# Patient Record
Sex: Male | Born: 1950 | Race: White | Hispanic: No | Marital: Married | Smoking: Former smoker
Health system: Southern US, Community
[De-identification: ages and names within clinical notes are randomized; demographics above are authoritative.]

## PROBLEM LIST (undated history)

## (undated) DIAGNOSIS — K4090 Unilateral inguinal hernia, without obstruction or gangrene, not specified as recurrent: Secondary | ICD-10-CM

## (undated) DIAGNOSIS — I2510 Atherosclerotic heart disease of native coronary artery without angina pectoris: Secondary | ICD-10-CM

## (undated) DIAGNOSIS — K219 Gastro-esophageal reflux disease without esophagitis: Secondary | ICD-10-CM

## (undated) DIAGNOSIS — T7840XA Allergy, unspecified, initial encounter: Secondary | ICD-10-CM

## (undated) DIAGNOSIS — F419 Anxiety disorder, unspecified: Secondary | ICD-10-CM

## (undated) DIAGNOSIS — M1990 Unspecified osteoarthritis, unspecified site: Secondary | ICD-10-CM

## (undated) DIAGNOSIS — K648 Other hemorrhoids: Secondary | ICD-10-CM

## (undated) DIAGNOSIS — N200 Calculus of kidney: Secondary | ICD-10-CM

## (undated) DIAGNOSIS — B029 Zoster without complications: Secondary | ICD-10-CM

## (undated) DIAGNOSIS — C61 Malignant neoplasm of prostate: Secondary | ICD-10-CM

## (undated) DIAGNOSIS — E785 Hyperlipidemia, unspecified: Secondary | ICD-10-CM

## (undated) DIAGNOSIS — K5790 Diverticulosis of intestine, part unspecified, without perforation or abscess without bleeding: Secondary | ICD-10-CM

## (undated) DIAGNOSIS — K635 Polyp of colon: Secondary | ICD-10-CM

## (undated) DIAGNOSIS — F329 Major depressive disorder, single episode, unspecified: Secondary | ICD-10-CM

## (undated) DIAGNOSIS — I219 Acute myocardial infarction, unspecified: Secondary | ICD-10-CM

## (undated) DIAGNOSIS — F32A Depression, unspecified: Secondary | ICD-10-CM

## (undated) DIAGNOSIS — I10 Essential (primary) hypertension: Secondary | ICD-10-CM

## (undated) DIAGNOSIS — N529 Male erectile dysfunction, unspecified: Secondary | ICD-10-CM

## (undated) DIAGNOSIS — I499 Cardiac arrhythmia, unspecified: Secondary | ICD-10-CM

## (undated) HISTORY — PX: COLONOSCOPY: SHX174

## (undated) HISTORY — DX: Atherosclerotic heart disease of native coronary artery without angina pectoris: I25.10

## (undated) HISTORY — DX: Zoster without complications: B02.9

## (undated) HISTORY — DX: Depression, unspecified: F32.A

## (undated) HISTORY — DX: Calculus of kidney: N20.0

## (undated) HISTORY — DX: Other hemorrhoids: K64.8

## (undated) HISTORY — DX: Cardiac arrhythmia, unspecified: I49.9

## (undated) HISTORY — DX: Malignant neoplasm of prostate: C61

## (undated) HISTORY — DX: Allergy, unspecified, initial encounter: T78.40XA

## (undated) HISTORY — DX: Diverticulosis of intestine, part unspecified, without perforation or abscess without bleeding: K57.90

## (undated) HISTORY — DX: Unilateral inguinal hernia, without obstruction or gangrene, not specified as recurrent: K40.90

## (undated) HISTORY — DX: Male erectile dysfunction, unspecified: N52.9

## (undated) HISTORY — DX: Polyp of colon: K63.5

## (undated) HISTORY — DX: Anxiety disorder, unspecified: F41.9

## (undated) HISTORY — DX: Unspecified osteoarthritis, unspecified site: M19.90

## (undated) HISTORY — DX: Major depressive disorder, single episode, unspecified: F32.9

## (undated) HISTORY — DX: Hyperlipidemia, unspecified: E78.5

## (undated) HISTORY — PX: VASECTOMY: SHX75

## (undated) HISTORY — PX: POLYPECTOMY: SHX149

---

## 1998-10-29 HISTORY — PX: INGUINAL HERNIA REPAIR: SUR1180

## 2007-11-09 ENCOUNTER — Ambulatory Visit: Payer: Self-pay | Admitting: Oncology

## 2007-11-18 ENCOUNTER — Ambulatory Visit: Admission: RE | Admit: 2007-11-18 | Discharge: 2007-12-29 | Payer: Self-pay | Admitting: Radiation Oncology

## 2007-11-18 LAB — COMPREHENSIVE METABOLIC PANEL
Albumin: 4.5 g/dL (ref 3.5–5.2)
Alkaline Phosphatase: 56 U/L (ref 39–117)
BUN: 12 mg/dL (ref 6–23)
CO2: 26 mEq/L (ref 19–32)
Calcium: 9.7 mg/dL (ref 8.4–10.5)
Creatinine, Ser: 1.07 mg/dL (ref 0.40–1.50)
Glucose, Bld: 97 mg/dL (ref 70–99)
Potassium: 4.2 mEq/L (ref 3.5–5.3)
Total Bilirubin: 0.8 mg/dL (ref 0.3–1.2)

## 2007-11-18 LAB — CBC WITH DIFFERENTIAL/PLATELET
MCV: 93 fL (ref 81.6–98.0)
MONO#: 0.3 10*3/uL (ref 0.1–0.9)
NEUT%: 69.3 % (ref 40.0–75.0)
RDW: 13.2 % (ref 11.2–14.6)
lymph#: 1.3 10*3/uL (ref 0.9–3.3)

## 2007-11-18 LAB — LACTATE DEHYDROGENASE: LDH: 171 U/L (ref 94–250)

## 2007-11-18 LAB — PSA: PSA: 4.05 ng/mL — ABNORMAL HIGH (ref 0.10–4.00)

## 2007-12-16 ENCOUNTER — Encounter: Admission: RE | Admit: 2007-12-16 | Discharge: 2007-12-16 | Payer: Self-pay | Admitting: Urology

## 2007-12-30 ENCOUNTER — Ambulatory Visit: Admission: RE | Admit: 2007-12-30 | Discharge: 2008-03-05 | Payer: Self-pay | Admitting: Radiation Oncology

## 2008-01-30 DIAGNOSIS — C61 Malignant neoplasm of prostate: Secondary | ICD-10-CM

## 2008-01-30 HISTORY — PX: RADIOACTIVE SEED IMPLANT: SHX5150

## 2008-01-30 HISTORY — DX: Malignant neoplasm of prostate: C61

## 2008-01-31 ENCOUNTER — Ambulatory Visit (HOSPITAL_BASED_OUTPATIENT_CLINIC_OR_DEPARTMENT_OTHER): Admission: RE | Admit: 2008-01-31 | Discharge: 2008-01-31 | Payer: Self-pay | Admitting: Urology

## 2008-12-29 DIAGNOSIS — B029 Zoster without complications: Secondary | ICD-10-CM

## 2008-12-29 HISTORY — DX: Zoster without complications: B02.9

## 2009-06-28 HISTORY — PX: CORONARY ANGIOPLASTY WITH STENT PLACEMENT: SHX49

## 2009-06-29 ENCOUNTER — Inpatient Hospital Stay (HOSPITAL_COMMUNITY): Admission: RE | Admit: 2009-06-29 | Discharge: 2009-06-30 | Payer: Self-pay | Admitting: Cardiology

## 2009-07-05 ENCOUNTER — Encounter (HOSPITAL_COMMUNITY): Admission: RE | Admit: 2009-07-05 | Discharge: 2009-10-03 | Payer: Self-pay | Admitting: Cardiology

## 2010-03-29 DIAGNOSIS — N200 Calculus of kidney: Secondary | ICD-10-CM

## 2010-03-29 HISTORY — DX: Calculus of kidney: N20.0

## 2010-04-21 ENCOUNTER — Emergency Department (HOSPITAL_COMMUNITY): Admission: EM | Admit: 2010-04-21 | Discharge: 2010-04-21 | Payer: Self-pay | Admitting: Emergency Medicine

## 2010-05-29 ENCOUNTER — Encounter (HOSPITAL_COMMUNITY)
Admission: RE | Admit: 2010-05-29 | Discharge: 2010-08-27 | Payer: Self-pay | Source: Home / Self Care | Admitting: Cardiology

## 2010-08-29 ENCOUNTER — Encounter (HOSPITAL_COMMUNITY)
Admission: RE | Admit: 2010-08-29 | Discharge: 2010-11-27 | Payer: Self-pay | Source: Home / Self Care | Admitting: Cardiology

## 2010-11-28 ENCOUNTER — Encounter (HOSPITAL_COMMUNITY)
Admission: RE | Admit: 2010-11-28 | Discharge: 2011-01-28 | Payer: Self-pay | Source: Home / Self Care | Attending: Cardiology | Admitting: Cardiology

## 2011-01-30 ENCOUNTER — Encounter (HOSPITAL_COMMUNITY): Admission: RE | Admit: 2011-01-30 | Payer: Self-pay | Source: Ambulatory Visit

## 2011-01-30 DIAGNOSIS — Z9861 Coronary angioplasty status: Secondary | ICD-10-CM | POA: Insufficient documentation

## 2011-01-30 DIAGNOSIS — Z5189 Encounter for other specified aftercare: Secondary | ICD-10-CM | POA: Insufficient documentation

## 2011-01-30 DIAGNOSIS — E785 Hyperlipidemia, unspecified: Secondary | ICD-10-CM | POA: Insufficient documentation

## 2011-01-30 DIAGNOSIS — K219 Gastro-esophageal reflux disease without esophagitis: Secondary | ICD-10-CM | POA: Insufficient documentation

## 2011-01-30 DIAGNOSIS — I209 Angina pectoris, unspecified: Secondary | ICD-10-CM | POA: Insufficient documentation

## 2011-01-30 DIAGNOSIS — I10 Essential (primary) hypertension: Secondary | ICD-10-CM | POA: Insufficient documentation

## 2011-01-30 DIAGNOSIS — I2510 Atherosclerotic heart disease of native coronary artery without angina pectoris: Secondary | ICD-10-CM | POA: Insufficient documentation

## 2011-01-30 DIAGNOSIS — I2582 Chronic total occlusion of coronary artery: Secondary | ICD-10-CM | POA: Insufficient documentation

## 2011-01-31 ENCOUNTER — Encounter (HOSPITAL_COMMUNITY): Payer: Self-pay

## 2011-02-04 ENCOUNTER — Encounter (HOSPITAL_COMMUNITY): Payer: Self-pay | Attending: Cardiology

## 2011-02-06 ENCOUNTER — Encounter (HOSPITAL_COMMUNITY): Admission: RE | Admit: 2011-02-06 | Payer: Self-pay | Source: Ambulatory Visit

## 2011-02-07 ENCOUNTER — Encounter (HOSPITAL_COMMUNITY): Payer: Self-pay

## 2011-02-11 ENCOUNTER — Encounter (HOSPITAL_COMMUNITY): Payer: Self-pay

## 2011-02-13 ENCOUNTER — Encounter (HOSPITAL_COMMUNITY): Payer: Self-pay

## 2011-02-14 ENCOUNTER — Encounter (HOSPITAL_COMMUNITY): Payer: Self-pay

## 2011-02-17 DIAGNOSIS — E7800 Pure hypercholesterolemia, unspecified: Secondary | ICD-10-CM | POA: Insufficient documentation

## 2011-02-17 DIAGNOSIS — K219 Gastro-esophageal reflux disease without esophagitis: Secondary | ICD-10-CM | POA: Insufficient documentation

## 2011-02-18 ENCOUNTER — Encounter (HOSPITAL_COMMUNITY): Payer: Self-pay

## 2011-02-20 ENCOUNTER — Encounter (HOSPITAL_COMMUNITY): Payer: Self-pay

## 2011-02-21 ENCOUNTER — Encounter (HOSPITAL_COMMUNITY): Payer: Self-pay

## 2011-02-25 ENCOUNTER — Encounter (HOSPITAL_COMMUNITY): Payer: Self-pay

## 2011-02-27 ENCOUNTER — Encounter (HOSPITAL_COMMUNITY): Payer: Self-pay | Attending: Cardiology

## 2011-02-27 DIAGNOSIS — I209 Angina pectoris, unspecified: Secondary | ICD-10-CM | POA: Insufficient documentation

## 2011-02-27 DIAGNOSIS — I10 Essential (primary) hypertension: Secondary | ICD-10-CM | POA: Insufficient documentation

## 2011-02-27 DIAGNOSIS — E785 Hyperlipidemia, unspecified: Secondary | ICD-10-CM | POA: Insufficient documentation

## 2011-02-27 DIAGNOSIS — Z9861 Coronary angioplasty status: Secondary | ICD-10-CM | POA: Insufficient documentation

## 2011-02-27 DIAGNOSIS — I2582 Chronic total occlusion of coronary artery: Secondary | ICD-10-CM | POA: Insufficient documentation

## 2011-02-27 DIAGNOSIS — Z5189 Encounter for other specified aftercare: Secondary | ICD-10-CM | POA: Insufficient documentation

## 2011-02-27 DIAGNOSIS — I2510 Atherosclerotic heart disease of native coronary artery without angina pectoris: Secondary | ICD-10-CM | POA: Insufficient documentation

## 2011-02-27 DIAGNOSIS — K219 Gastro-esophageal reflux disease without esophagitis: Secondary | ICD-10-CM | POA: Insufficient documentation

## 2011-02-28 ENCOUNTER — Encounter (HOSPITAL_COMMUNITY): Payer: Self-pay

## 2011-03-04 ENCOUNTER — Encounter (HOSPITAL_COMMUNITY): Payer: Self-pay

## 2011-03-06 ENCOUNTER — Encounter (HOSPITAL_COMMUNITY): Payer: Self-pay

## 2011-03-07 ENCOUNTER — Encounter (HOSPITAL_COMMUNITY): Payer: Self-pay

## 2011-03-11 ENCOUNTER — Encounter (HOSPITAL_COMMUNITY): Payer: Self-pay

## 2011-03-12 ENCOUNTER — Encounter (HOSPITAL_COMMUNITY): Payer: Self-pay

## 2011-03-13 ENCOUNTER — Encounter (HOSPITAL_COMMUNITY): Payer: Self-pay

## 2011-03-14 ENCOUNTER — Encounter (HOSPITAL_COMMUNITY): Payer: Self-pay

## 2011-03-18 ENCOUNTER — Encounter (HOSPITAL_COMMUNITY): Payer: Self-pay

## 2011-03-18 LAB — POCT I-STAT, CHEM 8
BUN: 16 mg/dL (ref 6–23)
Calcium, Ion: 1.14 mmol/L (ref 1.12–1.32)
Chloride: 106 mEq/L (ref 96–112)
Creatinine, Ser: 1.1 mg/dL (ref 0.4–1.5)
HCT: 46 % (ref 39.0–52.0)
Hemoglobin: 15.6 g/dL (ref 13.0–17.0)
Potassium: 3.9 mEq/L (ref 3.5–5.1)
Sodium: 141 mEq/L (ref 135–145)

## 2011-03-18 LAB — URINE MICROSCOPIC-ADD ON

## 2011-03-18 LAB — URINALYSIS, ROUTINE W REFLEX MICROSCOPIC
Ketones, ur: 15 mg/dL — AB
Leukocytes, UA: NEGATIVE
Protein, ur: NEGATIVE mg/dL
Urobilinogen, UA: 0.2 mg/dL (ref 0.0–1.0)

## 2011-03-19 ENCOUNTER — Ambulatory Visit (INDEPENDENT_AMBULATORY_CARE_PROVIDER_SITE_OTHER): Payer: BC Managed Care – PPO | Admitting: Family Medicine

## 2011-03-19 DIAGNOSIS — L258 Unspecified contact dermatitis due to other agents: Secondary | ICD-10-CM

## 2011-03-19 DIAGNOSIS — I259 Chronic ischemic heart disease, unspecified: Secondary | ICD-10-CM

## 2011-03-19 DIAGNOSIS — E7800 Pure hypercholesterolemia, unspecified: Secondary | ICD-10-CM

## 2011-03-19 DIAGNOSIS — F329 Major depressive disorder, single episode, unspecified: Secondary | ICD-10-CM

## 2011-03-20 ENCOUNTER — Encounter (HOSPITAL_COMMUNITY): Payer: Self-pay

## 2011-03-21 ENCOUNTER — Encounter (HOSPITAL_COMMUNITY): Payer: BC Managed Care – PPO

## 2011-03-25 ENCOUNTER — Encounter (HOSPITAL_COMMUNITY): Admission: RE | Admit: 2011-03-25 | Payer: Self-pay | Source: Ambulatory Visit

## 2011-03-27 ENCOUNTER — Encounter (HOSPITAL_COMMUNITY): Payer: Self-pay

## 2011-03-28 ENCOUNTER — Encounter (HOSPITAL_COMMUNITY): Payer: BC Managed Care – PPO

## 2011-04-01 ENCOUNTER — Encounter (HOSPITAL_COMMUNITY): Payer: Self-pay | Attending: Cardiology

## 2011-04-01 DIAGNOSIS — Z9861 Coronary angioplasty status: Secondary | ICD-10-CM | POA: Insufficient documentation

## 2011-04-01 DIAGNOSIS — I2582 Chronic total occlusion of coronary artery: Secondary | ICD-10-CM | POA: Insufficient documentation

## 2011-04-01 DIAGNOSIS — I2510 Atherosclerotic heart disease of native coronary artery without angina pectoris: Secondary | ICD-10-CM | POA: Insufficient documentation

## 2011-04-01 DIAGNOSIS — I10 Essential (primary) hypertension: Secondary | ICD-10-CM | POA: Insufficient documentation

## 2011-04-01 DIAGNOSIS — K219 Gastro-esophageal reflux disease without esophagitis: Secondary | ICD-10-CM | POA: Insufficient documentation

## 2011-04-01 DIAGNOSIS — E785 Hyperlipidemia, unspecified: Secondary | ICD-10-CM | POA: Insufficient documentation

## 2011-04-01 DIAGNOSIS — I209 Angina pectoris, unspecified: Secondary | ICD-10-CM | POA: Insufficient documentation

## 2011-04-01 DIAGNOSIS — Z5189 Encounter for other specified aftercare: Secondary | ICD-10-CM | POA: Insufficient documentation

## 2011-04-03 ENCOUNTER — Encounter (HOSPITAL_COMMUNITY): Payer: Self-pay

## 2011-04-04 ENCOUNTER — Encounter (HOSPITAL_COMMUNITY): Payer: Self-pay

## 2011-04-07 LAB — CARDIAC PANEL(CRET KIN+CKTOT+MB+TROPI)
CK, MB: 2 ng/mL (ref 0.3–4.0)
CK, MB: 6.8 ng/mL — ABNORMAL HIGH (ref 0.3–4.0)
Relative Index: INVALID (ref 0.0–2.5)
Total CK: 83 U/L (ref 7–232)

## 2011-04-07 LAB — CBC
Hemoglobin: 14.4 g/dL (ref 13.0–17.0)
MCV: 95.3 fL (ref 78.0–100.0)
RDW: 13.2 % (ref 11.5–15.5)

## 2011-04-07 LAB — BASIC METABOLIC PANEL
BUN: 11 mg/dL (ref 6–23)
Glucose, Bld: 90 mg/dL (ref 70–99)

## 2011-04-08 ENCOUNTER — Encounter (HOSPITAL_COMMUNITY): Payer: Self-pay

## 2011-04-10 ENCOUNTER — Encounter (HOSPITAL_COMMUNITY): Payer: Self-pay

## 2011-04-11 ENCOUNTER — Encounter (HOSPITAL_COMMUNITY): Payer: Self-pay

## 2011-04-15 ENCOUNTER — Encounter (HOSPITAL_COMMUNITY): Payer: Self-pay

## 2011-04-17 ENCOUNTER — Encounter (HOSPITAL_COMMUNITY): Payer: Self-pay

## 2011-04-18 ENCOUNTER — Encounter: Payer: Self-pay | Admitting: Family Medicine

## 2011-04-18 ENCOUNTER — Encounter (HOSPITAL_COMMUNITY): Payer: Self-pay

## 2011-04-18 DIAGNOSIS — K219 Gastro-esophageal reflux disease without esophagitis: Secondary | ICD-10-CM

## 2011-04-18 DIAGNOSIS — R972 Elevated prostate specific antigen [PSA]: Secondary | ICD-10-CM

## 2011-04-18 DIAGNOSIS — E7800 Pure hypercholesterolemia, unspecified: Secondary | ICD-10-CM

## 2011-04-18 DIAGNOSIS — I10 Essential (primary) hypertension: Secondary | ICD-10-CM

## 2011-04-22 ENCOUNTER — Encounter (HOSPITAL_COMMUNITY): Payer: Self-pay

## 2011-04-24 ENCOUNTER — Encounter (HOSPITAL_COMMUNITY): Payer: Self-pay

## 2011-04-25 ENCOUNTER — Encounter (HOSPITAL_COMMUNITY): Payer: Self-pay

## 2011-04-29 ENCOUNTER — Encounter (HOSPITAL_COMMUNITY): Admission: RE | Admit: 2011-04-29 | Payer: Self-pay | Source: Ambulatory Visit

## 2011-04-29 DIAGNOSIS — I2510 Atherosclerotic heart disease of native coronary artery without angina pectoris: Secondary | ICD-10-CM | POA: Insufficient documentation

## 2011-04-29 DIAGNOSIS — I2582 Chronic total occlusion of coronary artery: Secondary | ICD-10-CM | POA: Insufficient documentation

## 2011-04-29 DIAGNOSIS — Z9861 Coronary angioplasty status: Secondary | ICD-10-CM | POA: Insufficient documentation

## 2011-04-29 DIAGNOSIS — I209 Angina pectoris, unspecified: Secondary | ICD-10-CM | POA: Insufficient documentation

## 2011-04-29 DIAGNOSIS — I10 Essential (primary) hypertension: Secondary | ICD-10-CM | POA: Insufficient documentation

## 2011-04-29 DIAGNOSIS — E785 Hyperlipidemia, unspecified: Secondary | ICD-10-CM | POA: Insufficient documentation

## 2011-04-29 DIAGNOSIS — K219 Gastro-esophageal reflux disease without esophagitis: Secondary | ICD-10-CM | POA: Insufficient documentation

## 2011-04-29 DIAGNOSIS — Z5189 Encounter for other specified aftercare: Secondary | ICD-10-CM | POA: Insufficient documentation

## 2011-05-01 ENCOUNTER — Encounter (HOSPITAL_COMMUNITY): Payer: Self-pay | Attending: Cardiology

## 2011-05-02 ENCOUNTER — Encounter (HOSPITAL_COMMUNITY): Payer: Self-pay

## 2011-05-06 ENCOUNTER — Encounter (HOSPITAL_COMMUNITY): Payer: Self-pay

## 2011-05-08 ENCOUNTER — Encounter (HOSPITAL_COMMUNITY): Payer: Self-pay

## 2011-05-09 ENCOUNTER — Encounter (HOSPITAL_COMMUNITY): Payer: Self-pay

## 2011-05-13 ENCOUNTER — Encounter (HOSPITAL_COMMUNITY): Payer: Self-pay

## 2011-05-13 NOTE — Cardiovascular Report (Signed)
NAMEJOSHIAH, TRAYNHAM NO.:  [id number]   MEDICAL RECORD NO.:  [id number]          PATIENT TYPE:  INP   LOCATION:  2501                         FACILITY:  [facility]   PHYSICIAN:  Lyn Records, M.D.   DATE OF BIRTH:  [birth year]   DATE OF PROCEDURE:  06/29/2009  DATE OF DISCHARGE:                            CARDIAC CATHETERIZATION   INDICATIONS:  The patient has been having angina for greater than 6  weeks.  A recent Cardiolite was abnormal with anteroapical ischemia.  Cath this morning demonstrated 99% mid LAD diagonal #1 lesion.   PROCEDURE PERFORMED:  DES stent, chronic total occlusion of LAD.  Double  wire procedure with diagonal protection.   DESCRIPTION:  After informed consent, the 5-French sheath was exchanged  for a 6-French sheath.  This was performed with double gloving and both  Betadine and chlorhexidine antiseptic skin preparation.  Xylocaine 1%  local infiltration was performed.  The sheath was removed and discarded.  The new sheath was placed.  The outer layer of gloves were removed from  the operator and the scrub nurse.   We used a 3.5 XB LAD 6-French catheter to obtain guiding shots.  We then  used a BMW and [employer] wire to perform the procedure.  The BMW  was placed down the LAD and the Prowater into the diagonal.  We  predilated the mid LAD with a 2.5 x 12-mm long apex balloon to 12  atmospheres.  We then positioned and deployed a 3.0 x 15 Xience V DES to  12 atmospheres.  We then placed a 3.25-mm Quantum apex and postdilated  to 16 atmospheres.  We had removed the side branch wire and repositioned  it in the diagonal before high-pressure dilatation.  After high-pressure  dilatation, the diagonal remained patent with TIMI grade 3 flow.  Both  wires were removed, nitroglycerin was given, and angiographic images  were obtained.  The vessel demonstrated an 80-90% residual stenosis in  the ostium of the diagonal, unchanged from the findings  pre-stenting.  There is a region of midvessel 70-80% stenosis in the LAD.   The patient received an Angiomax bolus and infusion.  ACT was documented  to be greater than 400.  He received 600 mg of Plavix orally immediately  after crossing the LAD occlusion with the guidewire.  No complications  occurred.   CONCLUSION:  Successful recanalization of a chronic total occlusion of  the LAD.  This total occlusion is probably less than 74 weeks old.  The  vessel was stented with a drug-eluting stent down to less than 10%  stenosis.  Residual  moderately severe disease in the mid LAD and in the diagonal was not  treated with angioplasty or stenting.  Should the patient has symptoms,  perhaps these lesions will need to be approached.   PLAN:  Per Dr. Anne Fu.  At this point, aggressive secondary prevention.  Plavix should be continued at least 1 year.      Lyn Records, M.D.  Electronically Signed     HWS/MEDQ  D:  06/29/2009  T:  06/30/2009  Job:  [id number]   cc:   Jake Bathe, MD  Lavonda Jumbo, M.D.

## 2011-05-13 NOTE — Cardiovascular Report (Signed)
[name], [name] NO.:  [id number]   MEDICAL RECORD NO.:  [id number]          PATIENT TYPE:  INP   LOCATION:  2501                         FACILITY:  [facility]   PHYSICIAN:  Jake Bathe, MD      DATE OF BIRTH:  March 13, 1951   DATE OF PROCEDURE:  06/29/2009  DATE OF DISCHARGE:                            CARDIAC CATHETERIZATION   PROCEDURES:  1. Left heart catheterization.  2. Selective coronary angiography.  3. Left ventriculogram.   INDICATIONS:  A 60 year old male with severely abnormal nuclear stress  test demonstrating anteroseptal wall severe ischemia with hypertension,  hyperlipidemia, and chest pain 2 weeks ago.  No current chest pain.  No  chest pain on treadmill.   PROCEDURE DETAILS:  An informed consent was obtained.  Risk of stroke,  heart attack, death, renal impairment, bleeding, and arterial damage  were explained to the patient at length.  He was placed in the  catheterization table, prepped in sterile fashion.  Fluoroscopy of the  femoral head was obtained.  Lidocaine 1% was used for local anesthesia.  A 5-French sheath was placed into the right femoral artery using the  modified Seldinger technique with no difficulty.  A Judkins left #4  catheter and a Judkins right #4 catheter was used to selectively  cannulate the right coronary artery and left coronary artery.  Multiple  views of hand injection with Omnipaque were obtained.  The left  ventriculogram was performed utilizing a angle pigtail catheter and 30  mL of contrast in the RAO position.   Hemodynamics of the left ventricle were obtained and pullback was  obtained across the aortic valve.   FINDINGS:  1. Left main artery - branches into the LAD and circumflex artery -      there is no angiographically significant coronary artery disease      present.  2. Left anterior descending artery - there is a 100% occlusion at the      bifurcation of the first diagonal branch, which has a very  slow      amount of flow through this artery and dye staining just after      occlusion.  The first diagonal branch has a 90% stenosis      proximally.  3. Circumflex artery - large obtuse marginal system.  There are 3      small obtuse marginal branches proximal to the large distal      inferolateral branch.  4. Right coronary artery.  This artery is the dominant vessel giving      rise to the posterior descending artery - there is no      angiographically significant coronary artery disease present.      There is a small degree of right-to-left collaterals filling the      LAD system.  5. Left ventriculogram.  There is mid to distal/apical anterior wall      severe hypokinesis/akinesis.  No significant mitral regurgitation      present.  Ejection fraction is approximately 40%.  No LV thrombus      is noted.   HEMODYNAMICS:  Left ventricular systolic  pressure 117 with an end-  diastolic pressure of 11 mmHg.  Aortic pressure 117/77, with a mean of  96 mmHg.   IMPRESSION:  1. Severe left anterior descending coronary artery disease with      occlusion and 90% ostial first diagonal stenosis.  This correlates      with nuclear stress test findings, which demonstrate reversibility      in this territory.  2. Left ventricular ejection fraction of approximately 40% with mid to      apical anterior wall akinesis/severe hypokinesis.  3. No evidence of aortic stenosis or mitral regurgitation.   PLAN:  Findings have been discussed with Dr. Verdis Prime who will  attempt percutaneous intervention.  Continue with Lipitor.  We will  increased from 10 mg to 20 mg once a day.  His sheaths will be sewn in  and 4000 units of heparin will be administered and pressure bag will be  placed.  Dr. Katrinka Blazing will be available shortly for intervention.  He is  currently chest pain free.      Jake Bathe, MD  Electronically Signed     MCS/MEDQ  D:  06/29/2009  T:  06/30/2009  Job:  [id number]

## 2011-05-13 NOTE — Op Note (Signed)
NAME:  [name], [name] NO.:  [id number]   MEDICAL RECORD NO.:  [id number]          PATIENT TYPE:  AMB   LOCATION:  [facility]                         FACILITY:  [facility]   PHYSICIAN:  Mark C. Vernie Ammons, M.D.  DATE OF BIRTH:  Jan 20, 1951   DATE OF PROCEDURE:  01/31/2008  DATE OF DISCHARGE:                               OPERATIVE REPORT   PREOPERATIVE DIAGNOSIS:  Adenocarcinoma of the prostate.   POSTOPERATIVE DIAGNOSIS:  Adenocarcinoma of the prostate.   PROCEDURE:  I-125 seed implant.   SURGEON:  Mark C. Vernie Ammons, M.D.   RADIATION ONCOLOGIST:  Maryln Gottron, M.D.   ANESTHESIA:  General.   DRAINS:  16-French Foley catheter.   BLOOD LOSS:  Minimal.   SPECIMENS:  None.   COMPLICATIONS:  None.   INDICATIONS:  The patient is a 60 year old white male who was found have  an elevated PSA of 5.  He had a benign exam but was found by biopsy to  have adenocarcinoma of the prostate and has elected to proceed with  radioactive seed implant for treatment of this.  We have discussed the  risks, complications and alternatives.  He understands and elected to  proceed.   DESCRIPTION OF OPERATION:  After informed consent, the patient was  brought to major OR, placed on the table, administered general  anesthesia, then moved to the modified lithotomy position with the  perineum perpendicular to the floor.  A 16-French Foley catheter with  dilute contrast was then inserted in the bladder and the rectal probe as  well as a rectal tube were inserted in the rectum.  The transrectal  ultrasound probe was connected into the securing base and real-time  planning was performed with the Nucletron software by Dr. Dayton Scrape.   After the full plan was complete, I then proceeded to place a total of  72 seeds with 28 needles according to the previously-devised plan using  real-time ultrasound guidance.  This proceeded without complication.  Therefore the transrectal ultrasound probe and rectal  tube were removed  and the Foley catheter was removed.  The seed placement was documented  fluoroscopically with all seeds appearing to be in good position.   Flexible cystoscopy was then performed by first removing the Foley  catheter and then inserting the 17-French flexible scope under direct  visualization.  The urethra was noted be normal down to the sphincter,  which appears intact.  Prostatic urethra revealed bilobar hypertrophy  but had no lesions or evidence of foreign seeds within the prostatic  urethra.  The scope was then advanced into the bladder and the bladder  was then fully inspected.  There no tumors, stones or inflammatory  lesions identified.  Ureteral orifices were in normal configuration and  position and retroflexion of the scope revealed no seeds protruding from  the base of the prostate.  I therefore removed the cystoscope and  reinserted a new 16-French Foley catheter and connected this to closed-  system drainage, and the patient was awakened and taken to the recovery  room in stable, satisfactory condition.  He tolerated the procedure  well.  There were  no intraoperative complications.   The Foley catheter will be left in dwelling for 24 hours and the patient  will be maintained on Cipro 500 mg b.i.d. for 5 days and be given a  prescription for Vicodin #12.  He will return to my office in follow-up  in 3 weeks.      Mark C. Vernie Ammons, M.D.  Electronically Signed     MCO/MEDQ  D:  01/31/2008  T:  01/31/2008  Job:  [id number]   cc:   Meredith Staggers, M.D.  Fax: [phone]

## 2011-05-15 ENCOUNTER — Encounter: Payer: Self-pay | Admitting: Family Medicine

## 2011-05-15 ENCOUNTER — Ambulatory Visit (INDEPENDENT_AMBULATORY_CARE_PROVIDER_SITE_OTHER): Payer: BC Managed Care – PPO | Admitting: Family Medicine

## 2011-05-15 ENCOUNTER — Encounter (HOSPITAL_COMMUNITY): Payer: Self-pay

## 2011-05-15 DIAGNOSIS — Z79899 Other long term (current) drug therapy: Secondary | ICD-10-CM

## 2011-05-15 DIAGNOSIS — E785 Hyperlipidemia, unspecified: Secondary | ICD-10-CM

## 2011-05-15 DIAGNOSIS — F411 Generalized anxiety disorder: Secondary | ICD-10-CM

## 2011-05-15 DIAGNOSIS — F419 Anxiety disorder, unspecified: Secondary | ICD-10-CM

## 2011-05-15 LAB — CBC WITH DIFFERENTIAL/PLATELET
Eosinophils Relative: 1 % (ref 0–5)
Hemoglobin: 16 g/dL (ref 13.0–17.0)
Lymphocytes Relative: 25 % (ref 12–46)
MCHC: 34.9 g/dL (ref 30.0–36.0)
MCV: 94.3 fL (ref 78.0–100.0)
Monocytes Relative: 9 % (ref 3–12)
Neutro Abs: 4.1 10*3/uL (ref 1.7–7.7)
Neutrophils Relative %: 64 % (ref 43–77)
Platelets: 175 10*3/uL (ref 150–400)
RBC: 4.87 MIL/uL (ref 4.22–5.81)
RDW: 13.6 % (ref 11.5–15.5)

## 2011-05-15 LAB — COMPREHENSIVE METABOLIC PANEL
ALT: 25 U/L (ref 0–53)
AST: 26 U/L (ref 0–37)
BUN: 16 mg/dL (ref 6–23)
Creat: 1.02 mg/dL (ref 0.40–1.50)
Glucose, Bld: 84 mg/dL (ref 70–99)
Potassium: 4.5 mEq/L (ref 3.5–5.3)
Sodium: 138 mEq/L (ref 135–145)

## 2011-05-15 LAB — LIPID PANEL
HDL: 49 mg/dL (ref >39)
LDL Cholesterol: 63 mg/dL (ref 0–99)
Total CHOL/HDL Ratio: 2.7 Ratio
Triglycerides: 93 mg/dL (ref <150)
VLDL: 19 mg/dL (ref 0–40)

## 2011-05-15 NOTE — Progress Notes (Signed)
  Subjective:    Patient ID: [name], male    DOB: 02-10-1951, 60 y.o.   MRN: [id number]  HPI he is here for followup on his anxiety. He is presently on 40 mg of Celexa and uses Xanax 2 or 3 times per week. He seems to be doing well on this. He apparently has been in counseling in the past and has not the need for it right now. He also continues on his other medications and needs blood work. His medical record was reviewed and does show evidence of heart disease or prostate cancer and hyperlipidemia.   Review of Systems     Objective:   Physical Exam alert and in no distress with a slightly flat affect.        Assessment & Plan:  Anxiety. ASHD. Prostate cancer. Blood work will be drawn.

## 2011-05-15 NOTE — Patient Instructions (Signed)
Continue on your present medications. Dr. Lynelle Doctor will contact you concerning your blood work.

## 2011-05-16 ENCOUNTER — Encounter (HOSPITAL_COMMUNITY): Payer: Self-pay

## 2011-05-19 ENCOUNTER — Encounter: Payer: Self-pay | Admitting: Family Medicine

## 2011-05-20 ENCOUNTER — Encounter (HOSPITAL_COMMUNITY): Payer: Self-pay

## 2011-05-22 ENCOUNTER — Encounter (HOSPITAL_COMMUNITY): Payer: Self-pay

## 2011-05-23 ENCOUNTER — Encounter (HOSPITAL_COMMUNITY): Payer: Self-pay

## 2011-05-27 ENCOUNTER — Encounter (HOSPITAL_COMMUNITY): Payer: Self-pay

## 2011-05-29 ENCOUNTER — Encounter (HOSPITAL_COMMUNITY): Payer: Self-pay

## 2011-05-30 ENCOUNTER — Encounter (HOSPITAL_COMMUNITY): Payer: Self-pay | Attending: Cardiology

## 2011-05-30 DIAGNOSIS — K219 Gastro-esophageal reflux disease without esophagitis: Secondary | ICD-10-CM | POA: Insufficient documentation

## 2011-05-30 DIAGNOSIS — I10 Essential (primary) hypertension: Secondary | ICD-10-CM | POA: Insufficient documentation

## 2011-05-30 DIAGNOSIS — I2510 Atherosclerotic heart disease of native coronary artery without angina pectoris: Secondary | ICD-10-CM | POA: Insufficient documentation

## 2011-05-30 DIAGNOSIS — E785 Hyperlipidemia, unspecified: Secondary | ICD-10-CM | POA: Insufficient documentation

## 2011-05-30 DIAGNOSIS — I2582 Chronic total occlusion of coronary artery: Secondary | ICD-10-CM | POA: Insufficient documentation

## 2011-05-30 DIAGNOSIS — Z9861 Coronary angioplasty status: Secondary | ICD-10-CM | POA: Insufficient documentation

## 2011-05-30 DIAGNOSIS — Z5189 Encounter for other specified aftercare: Secondary | ICD-10-CM | POA: Insufficient documentation

## 2011-05-30 DIAGNOSIS — I209 Angina pectoris, unspecified: Secondary | ICD-10-CM | POA: Insufficient documentation

## 2011-06-03 ENCOUNTER — Encounter (HOSPITAL_COMMUNITY): Payer: Self-pay

## 2011-06-05 ENCOUNTER — Encounter (HOSPITAL_COMMUNITY): Payer: Self-pay

## 2011-06-06 ENCOUNTER — Encounter (HOSPITAL_COMMUNITY): Payer: Self-pay

## 2011-06-10 ENCOUNTER — Encounter (HOSPITAL_COMMUNITY): Payer: Self-pay

## 2011-06-11 ENCOUNTER — Other Ambulatory Visit: Payer: Self-pay | Admitting: *Deleted

## 2011-06-11 DIAGNOSIS — F419 Anxiety disorder, unspecified: Secondary | ICD-10-CM

## 2011-06-11 MED ORDER — ALPRAZOLAM 0.5 MG PO TABS: 0.5000 mg | ORAL_TABLET | Freq: Every evening | ORAL | Status: DC | PRN

## 2011-06-12 ENCOUNTER — Encounter (HOSPITAL_COMMUNITY): Payer: Self-pay

## 2011-06-13 ENCOUNTER — Encounter (HOSPITAL_COMMUNITY): Payer: Self-pay

## 2011-06-17 ENCOUNTER — Encounter (HOSPITAL_COMMUNITY): Payer: Self-pay

## 2011-06-19 ENCOUNTER — Encounter (HOSPITAL_COMMUNITY): Payer: Self-pay

## 2011-06-20 ENCOUNTER — Encounter (HOSPITAL_COMMUNITY): Payer: Self-pay

## 2011-06-24 ENCOUNTER — Encounter (HOSPITAL_COMMUNITY): Payer: Self-pay

## 2011-06-26 ENCOUNTER — Encounter (HOSPITAL_COMMUNITY): Payer: Self-pay

## 2011-06-27 ENCOUNTER — Encounter (HOSPITAL_COMMUNITY): Payer: Self-pay

## 2011-07-01 ENCOUNTER — Encounter (HOSPITAL_COMMUNITY): Payer: Self-pay | Attending: Cardiology

## 2011-07-01 DIAGNOSIS — I209 Angina pectoris, unspecified: Secondary | ICD-10-CM | POA: Insufficient documentation

## 2011-07-01 DIAGNOSIS — I10 Essential (primary) hypertension: Secondary | ICD-10-CM | POA: Insufficient documentation

## 2011-07-01 DIAGNOSIS — I2510 Atherosclerotic heart disease of native coronary artery without angina pectoris: Secondary | ICD-10-CM | POA: Insufficient documentation

## 2011-07-01 DIAGNOSIS — E785 Hyperlipidemia, unspecified: Secondary | ICD-10-CM | POA: Insufficient documentation

## 2011-07-01 DIAGNOSIS — K219 Gastro-esophageal reflux disease without esophagitis: Secondary | ICD-10-CM | POA: Insufficient documentation

## 2011-07-01 DIAGNOSIS — Z5189 Encounter for other specified aftercare: Secondary | ICD-10-CM | POA: Insufficient documentation

## 2011-07-01 DIAGNOSIS — I2582 Chronic total occlusion of coronary artery: Secondary | ICD-10-CM | POA: Insufficient documentation

## 2011-07-01 DIAGNOSIS — Z9861 Coronary angioplasty status: Secondary | ICD-10-CM | POA: Insufficient documentation

## 2011-07-03 ENCOUNTER — Encounter (HOSPITAL_COMMUNITY): Payer: Self-pay

## 2011-07-04 ENCOUNTER — Encounter (HOSPITAL_COMMUNITY): Payer: Self-pay

## 2011-07-08 ENCOUNTER — Encounter (HOSPITAL_COMMUNITY): Payer: Self-pay

## 2011-07-10 ENCOUNTER — Encounter (HOSPITAL_COMMUNITY): Payer: Self-pay

## 2011-07-11 ENCOUNTER — Encounter (HOSPITAL_COMMUNITY): Payer: Self-pay

## 2011-07-15 ENCOUNTER — Encounter (HOSPITAL_COMMUNITY): Payer: Self-pay

## 2011-07-17 ENCOUNTER — Encounter (HOSPITAL_COMMUNITY): Payer: Self-pay

## 2011-07-18 ENCOUNTER — Encounter (HOSPITAL_COMMUNITY): Payer: Self-pay

## 2011-07-22 ENCOUNTER — Encounter (HOSPITAL_COMMUNITY): Payer: Self-pay

## 2011-07-24 ENCOUNTER — Encounter (HOSPITAL_COMMUNITY): Payer: Self-pay

## 2011-07-25 ENCOUNTER — Encounter (HOSPITAL_COMMUNITY): Payer: Self-pay

## 2011-07-29 ENCOUNTER — Encounter (HOSPITAL_COMMUNITY): Payer: Self-pay

## 2011-07-31 ENCOUNTER — Encounter (HOSPITAL_COMMUNITY): Payer: Self-pay | Attending: Cardiology

## 2011-07-31 DIAGNOSIS — E785 Hyperlipidemia, unspecified: Secondary | ICD-10-CM | POA: Insufficient documentation

## 2011-07-31 DIAGNOSIS — Z9861 Coronary angioplasty status: Secondary | ICD-10-CM | POA: Insufficient documentation

## 2011-07-31 DIAGNOSIS — I209 Angina pectoris, unspecified: Secondary | ICD-10-CM | POA: Insufficient documentation

## 2011-07-31 DIAGNOSIS — I10 Essential (primary) hypertension: Secondary | ICD-10-CM | POA: Insufficient documentation

## 2011-07-31 DIAGNOSIS — K219 Gastro-esophageal reflux disease without esophagitis: Secondary | ICD-10-CM | POA: Insufficient documentation

## 2011-07-31 DIAGNOSIS — I2510 Atherosclerotic heart disease of native coronary artery without angina pectoris: Secondary | ICD-10-CM | POA: Insufficient documentation

## 2011-07-31 DIAGNOSIS — I2582 Chronic total occlusion of coronary artery: Secondary | ICD-10-CM | POA: Insufficient documentation

## 2011-07-31 DIAGNOSIS — Z5189 Encounter for other specified aftercare: Secondary | ICD-10-CM | POA: Insufficient documentation

## 2011-08-01 ENCOUNTER — Encounter (HOSPITAL_COMMUNITY): Payer: Self-pay

## 2011-08-05 ENCOUNTER — Encounter (HOSPITAL_COMMUNITY): Payer: Self-pay

## 2011-08-07 ENCOUNTER — Encounter (HOSPITAL_COMMUNITY): Payer: Self-pay

## 2011-08-08 ENCOUNTER — Encounter (HOSPITAL_COMMUNITY): Payer: Self-pay

## 2011-08-12 ENCOUNTER — Encounter (HOSPITAL_COMMUNITY): Payer: Self-pay

## 2011-08-14 ENCOUNTER — Encounter (HOSPITAL_COMMUNITY): Payer: Self-pay

## 2011-08-15 ENCOUNTER — Encounter (HOSPITAL_COMMUNITY): Payer: Self-pay

## 2011-08-19 ENCOUNTER — Encounter (HOSPITAL_COMMUNITY): Payer: Self-pay

## 2011-08-21 ENCOUNTER — Encounter (HOSPITAL_COMMUNITY): Payer: Self-pay

## 2011-08-22 ENCOUNTER — Encounter (HOSPITAL_COMMUNITY): Payer: Self-pay

## 2011-08-26 ENCOUNTER — Encounter (HOSPITAL_COMMUNITY): Payer: Self-pay

## 2011-08-28 ENCOUNTER — Encounter (HOSPITAL_COMMUNITY): Payer: Self-pay

## 2011-08-29 ENCOUNTER — Encounter (HOSPITAL_COMMUNITY): Payer: Self-pay

## 2011-09-02 ENCOUNTER — Encounter (HOSPITAL_COMMUNITY): Payer: Self-pay | Attending: Cardiology

## 2011-09-02 DIAGNOSIS — E785 Hyperlipidemia, unspecified: Secondary | ICD-10-CM | POA: Insufficient documentation

## 2011-09-02 DIAGNOSIS — I2510 Atherosclerotic heart disease of native coronary artery without angina pectoris: Secondary | ICD-10-CM | POA: Insufficient documentation

## 2011-09-02 DIAGNOSIS — Z5189 Encounter for other specified aftercare: Secondary | ICD-10-CM | POA: Insufficient documentation

## 2011-09-02 DIAGNOSIS — I10 Essential (primary) hypertension: Secondary | ICD-10-CM | POA: Insufficient documentation

## 2011-09-02 DIAGNOSIS — Z9861 Coronary angioplasty status: Secondary | ICD-10-CM | POA: Insufficient documentation

## 2011-09-02 DIAGNOSIS — K219 Gastro-esophageal reflux disease without esophagitis: Secondary | ICD-10-CM | POA: Insufficient documentation

## 2011-09-02 DIAGNOSIS — I2582 Chronic total occlusion of coronary artery: Secondary | ICD-10-CM | POA: Insufficient documentation

## 2011-09-02 DIAGNOSIS — I209 Angina pectoris, unspecified: Secondary | ICD-10-CM | POA: Insufficient documentation

## 2011-09-04 ENCOUNTER — Encounter (HOSPITAL_COMMUNITY): Payer: Self-pay

## 2011-09-05 ENCOUNTER — Encounter (HOSPITAL_COMMUNITY): Payer: Self-pay

## 2011-09-09 ENCOUNTER — Encounter (HOSPITAL_COMMUNITY): Payer: Self-pay

## 2011-09-11 ENCOUNTER — Encounter (HOSPITAL_COMMUNITY): Payer: Self-pay

## 2011-09-12 ENCOUNTER — Encounter (HOSPITAL_COMMUNITY): Payer: Self-pay

## 2011-09-16 ENCOUNTER — Encounter (HOSPITAL_COMMUNITY): Payer: Self-pay

## 2011-09-18 ENCOUNTER — Encounter (HOSPITAL_COMMUNITY): Payer: Self-pay

## 2011-09-18 LAB — APTT: aPTT: 29

## 2011-09-18 LAB — CBC
HCT: 45.6
Hemoglobin: 15.7
MCHC: 34.5
MCV: 92.7
Platelets: 201
RBC: 4.92
RDW: 13.6
WBC: 5.7

## 2011-09-18 LAB — COMPREHENSIVE METABOLIC PANEL
ALT: 53
AST: 34
Albumin: 3.9
Alkaline Phosphatase: 58
BUN: 13
CO2: 29
Calcium: 9.3
Chloride: 103
Creatinine, Ser: 1.05
GFR calc Af Amer: >60
GFR calc non Af Amer: >60
Glucose, Bld: 111 — ABNORMAL HIGH
Potassium: 4.4
Sodium: 140
Total Bilirubin: 1
Total Protein: 6.3

## 2011-09-18 LAB — PROTIME-INR
INR: 1
Prothrombin Time: 12.9

## 2011-09-19 ENCOUNTER — Encounter (HOSPITAL_COMMUNITY): Payer: Self-pay

## 2011-09-23 ENCOUNTER — Encounter (HOSPITAL_COMMUNITY): Payer: Self-pay

## 2011-09-25 ENCOUNTER — Encounter (HOSPITAL_COMMUNITY): Payer: Self-pay

## 2011-09-26 ENCOUNTER — Encounter (HOSPITAL_COMMUNITY): Payer: Self-pay

## 2011-09-30 ENCOUNTER — Encounter (HOSPITAL_COMMUNITY): Payer: Self-pay | Attending: Cardiology

## 2011-09-30 DIAGNOSIS — E785 Hyperlipidemia, unspecified: Secondary | ICD-10-CM | POA: Insufficient documentation

## 2011-09-30 DIAGNOSIS — I2582 Chronic total occlusion of coronary artery: Secondary | ICD-10-CM | POA: Insufficient documentation

## 2011-09-30 DIAGNOSIS — Z5189 Encounter for other specified aftercare: Secondary | ICD-10-CM | POA: Insufficient documentation

## 2011-09-30 DIAGNOSIS — Z9861 Coronary angioplasty status: Secondary | ICD-10-CM | POA: Insufficient documentation

## 2011-09-30 DIAGNOSIS — I2510 Atherosclerotic heart disease of native coronary artery without angina pectoris: Secondary | ICD-10-CM | POA: Insufficient documentation

## 2011-09-30 DIAGNOSIS — K219 Gastro-esophageal reflux disease without esophagitis: Secondary | ICD-10-CM | POA: Insufficient documentation

## 2011-09-30 DIAGNOSIS — I209 Angina pectoris, unspecified: Secondary | ICD-10-CM | POA: Insufficient documentation

## 2011-09-30 DIAGNOSIS — I10 Essential (primary) hypertension: Secondary | ICD-10-CM | POA: Insufficient documentation

## 2011-10-02 ENCOUNTER — Encounter (HOSPITAL_COMMUNITY): Payer: Self-pay

## 2011-10-03 ENCOUNTER — Encounter (HOSPITAL_COMMUNITY): Payer: Self-pay

## 2011-10-07 ENCOUNTER — Encounter (HOSPITAL_COMMUNITY): Payer: Self-pay

## 2011-10-09 ENCOUNTER — Encounter (HOSPITAL_COMMUNITY): Payer: Self-pay

## 2011-10-10 ENCOUNTER — Encounter (HOSPITAL_COMMUNITY): Payer: Self-pay

## 2011-10-14 ENCOUNTER — Encounter (HOSPITAL_COMMUNITY): Payer: Self-pay

## 2011-10-14 ENCOUNTER — Other Ambulatory Visit: Payer: Self-pay | Admitting: Family Medicine

## 2011-10-16 ENCOUNTER — Encounter (HOSPITAL_COMMUNITY): Payer: Self-pay

## 2011-10-17 ENCOUNTER — Encounter (HOSPITAL_COMMUNITY): Payer: Self-pay

## 2011-10-21 ENCOUNTER — Encounter (HOSPITAL_COMMUNITY): Payer: Self-pay

## 2011-10-23 ENCOUNTER — Encounter (HOSPITAL_COMMUNITY): Payer: Self-pay

## 2011-10-24 ENCOUNTER — Encounter (HOSPITAL_COMMUNITY): Payer: Self-pay

## 2011-10-28 ENCOUNTER — Encounter (HOSPITAL_COMMUNITY): Payer: Self-pay

## 2011-10-30 ENCOUNTER — Encounter (HOSPITAL_COMMUNITY): Payer: Self-pay

## 2011-10-30 DIAGNOSIS — E785 Hyperlipidemia, unspecified: Secondary | ICD-10-CM | POA: Insufficient documentation

## 2011-10-30 DIAGNOSIS — Z5189 Encounter for other specified aftercare: Secondary | ICD-10-CM | POA: Insufficient documentation

## 2011-10-30 DIAGNOSIS — I2510 Atherosclerotic heart disease of native coronary artery without angina pectoris: Secondary | ICD-10-CM | POA: Insufficient documentation

## 2011-10-30 DIAGNOSIS — Z9861 Coronary angioplasty status: Secondary | ICD-10-CM | POA: Insufficient documentation

## 2011-10-30 DIAGNOSIS — I2582 Chronic total occlusion of coronary artery: Secondary | ICD-10-CM | POA: Insufficient documentation

## 2011-10-30 DIAGNOSIS — I209 Angina pectoris, unspecified: Secondary | ICD-10-CM | POA: Insufficient documentation

## 2011-10-30 DIAGNOSIS — I10 Essential (primary) hypertension: Secondary | ICD-10-CM | POA: Insufficient documentation

## 2011-10-30 DIAGNOSIS — K219 Gastro-esophageal reflux disease without esophagitis: Secondary | ICD-10-CM | POA: Insufficient documentation

## 2011-10-31 ENCOUNTER — Encounter (HOSPITAL_COMMUNITY): Payer: Self-pay

## 2011-11-03 ENCOUNTER — Ambulatory Visit (INDEPENDENT_AMBULATORY_CARE_PROVIDER_SITE_OTHER): Payer: BC Managed Care – PPO | Admitting: Family Medicine

## 2011-11-03 ENCOUNTER — Encounter: Payer: Self-pay | Admitting: Family Medicine

## 2011-11-03 DIAGNOSIS — Z23 Encounter for immunization: Secondary | ICD-10-CM

## 2011-11-03 DIAGNOSIS — E7800 Pure hypercholesterolemia, unspecified: Secondary | ICD-10-CM

## 2011-11-03 DIAGNOSIS — I2510 Atherosclerotic heart disease of native coronary artery without angina pectoris: Secondary | ICD-10-CM | POA: Insufficient documentation

## 2011-11-03 DIAGNOSIS — F411 Generalized anxiety disorder: Secondary | ICD-10-CM

## 2011-11-03 DIAGNOSIS — C61 Malignant neoplasm of prostate: Secondary | ICD-10-CM

## 2011-11-03 DIAGNOSIS — Z0000 Encounter for general adult medical examination without abnormal findings: Secondary | ICD-10-CM

## 2011-11-03 LAB — POCT URINALYSIS DIPSTICK
Blood, UA: NEGATIVE
Glucose, UA: NEGATIVE
Nitrite, UA: NEGATIVE

## 2011-11-03 LAB — CBC WITH DIFFERENTIAL/PLATELET
Basophils Absolute: 0 10*3/uL (ref 0.0–0.1)
HCT: 49.9 % (ref 39.0–52.0)
Hemoglobin: 16.9 g/dL (ref 13.0–17.0)
Lymphocytes Relative: 29 % (ref 12–46)
Lymphs Abs: 2.1 10*3/uL (ref 0.7–4.0)
Monocytes Absolute: 0.6 10*3/uL (ref 0.1–1.0)
Monocytes Relative: 9 % (ref 3–12)
Neutro Abs: 4.4 10*3/uL (ref 1.7–7.7)
RBC: 5.18 MIL/uL (ref 4.22–5.81)
RDW: 13.2 % (ref 11.5–15.5)
WBC: 7.4 10*3/uL (ref 4.0–10.5)

## 2011-11-03 LAB — LIPID PANEL
Cholesterol: 156 mg/dL (ref 0–200)
HDL: 53 mg/dL (ref >39)
Triglycerides: 105 mg/dL (ref <150)

## 2011-11-03 LAB — COMPREHENSIVE METABOLIC PANEL
BUN: 18 mg/dL (ref 6–23)
CO2: 30 mEq/L (ref 19–32)
Calcium: 9.8 mg/dL (ref 8.4–10.5)
Chloride: 101 mEq/L (ref 96–112)
Creat: 1.06 mg/dL (ref 0.50–1.35)
Glucose, Bld: 92 mg/dL (ref 70–99)
Total Bilirubin: 0.6 mg/dL (ref 0.3–1.2)

## 2011-11-03 MED ORDER — CITALOPRAM HYDROBROMIDE 40 MG PO TABS: 40.0000 mg | ORAL_TABLET | Freq: Every day | ORAL | Status: DC

## 2011-11-03 NOTE — Progress Notes (Signed)
[name] is a 60 y.o. male who presents for a complete physical.  He has the following concerns: F/u anxiety.  Needs refill on Celexa.  Doesn't seem to be as effective as it was initially.  Still having some anxiety, takes xanax once a week, which makes him sleepy.  Usually anxiety comes on mid-day. Some pain in R shoulder and upper arm, especially when lying on that side at night.  Relieved by change in position.  Symptoms x 6-8 weeks.  Doing weights once a week at cardiac maintenance class  Immunization History  Administered Date(s) Administered  . Influenza Split 11/03/2011  . Td 11/26/2005  . Tdap 11/03/2011   Last colonoscopy: 2005 Last PSA: yearly, due now Ophtho: 6 years ago Dentist: twice yearly Exercise: 4 days/week (3 days cardiac rehab, and walking or paddling once a week)  Past Medical History  Diagnosis Date  . Hyperlipidemia   . Anxiety 05/2010  . Prostate cancer 01/2008     s/p brachiotherapy; Dr. Vernie Ammons  . CAD (coronary artery disease) 2010    stent to LAD 06/2009; Dr. Anne Fu  . Kidney stone 03/2010  . ED (erectile dysfunction)   . Irregular heartbeat     started on beta blocker by Dr. Anne Fu, improved  . Shingles 2010    Past Surgical History  Procedure Date  . Heart stent 06/2009    drug-eluting stent LAD  . Inguinal hernia repair 10/1998    left  . Colonoscopy 2005  . Vasectomy   . Radioactive seed implant 01/2008    prostate cancer    History   Social History  . Marital Status: Married    Spouse Name: N/A    Number of Children: 3  . Years of Education: N/A   Occupational History  . manufacturing    Social History Main Topics  . Smoking status: Never Smoker   . Smokeless tobacco: Never Used  . Alcohol Use: Yes     5 beers per week.  . Drug Use: No  . Sexually Active: Not on file   Other Topics Concern  . Not on file   Social History Narrative   Married. Currently daughter and 3 grandkids are living with them (going through  separation).  Another daughter in [place], and one in [place].    Family History  Problem Relation Age of Onset  . Heart disease Father 29  . Cancer Father 60    prostate  . Hypertension Father   . Hyperlipidemia Father   . Cancer Mother     bladder  . Eating disorder Mother   . Hodgkin's lymphoma Daughter   . Cardiomyopathy Daughter     related to treatment for lymphoma  . Cancer Daughter     thyroid  . Heart disease Daughter     congenital ASD, s/p repair  . Cancer Paternal Grandfather     prostate    Current outpatient prescriptions:ALPRAZolam (XANAX) 0.5 MG tablet, Take 1 tablet (0.5 mg total) by mouth at bedtime as needed., Disp: 30 tablet, Rfl: 0;  aspirin 81 MG tablet, Take 81 mg by mouth daily.  , Disp: , Rfl: ;  atorvastatin (LIPITOR) 20 MG tablet, Take 20 mg by mouth daily.  , Disp: , Rfl: ;  citalopram (CELEXA) 40 MG tablet, Take 1 tablet (40 mg total) by mouth daily., Disp: 90 tablet, Rfl: 3 fish oil-omega-3 fatty acids 1000 MG capsule, Take 1 g by mouth daily.  , Disp: , Rfl: ;  metoprolol succinate (  TOPROL-XL) 25 MG 24 hr tablet, Take 1 tablet by mouth Daily., Disp: , Rfl: ;  NEXIUM 40 MG capsule, TAKE ONE CAPSULE EVERY DAY, Disp: 90 capsule, Rfl: 0;  vardenafil (LEVITRA) 20 MG tablet, Take 20 mg by mouth daily as needed.  , Disp: , Rfl: ;  DISCONTD: citalopram (CELEXA) 40 MG tablet, Take 40 mg by mouth daily.  , Disp: , Rfl:   Allergies  Allergen Reactions  . Imdur (Isosorbide Mononitrate) Rash   ROS: The patient denies anorexia, fever, weight changes, headaches,  vision loss, decreased hearing, ear pain, hoarseness, chest pain, palpitations, dizziness, syncope, dyspnea on exertion, cough, swelling, nausea, vomiting, diarrhea, constipation, abdominal pain, melena, hematochezia, indigestion/heartburn, hematuria, incontinence,  nocturia (1-2x/night), weakened urine stream, dysuria, genital lesions, joint pains, numbness, tingling, weakness, tremor, suspicious skin lesions,  depression, anxiety, abnormal bleeding/bruising, or enlarged lymph nodes. +erectile dysfunction  PHYSICAL EXAM: BP 124/70  Pulse 60  Ht 5\' 10"  (1.778 m)  Wt 183 lb (83.008 kg)  BMI 26.26 kg/m2  General Appearance:    Alert, cooperative, no distress, appears stated age  Head:    Normocephalic, without obvious abnormality, atraumatic  Eyes:    PERRL, conjunctiva/corneas clear, EOM's intact, fundi    benign  Ears:    Normal TM's and external ear canals  Nose:   Nares normal, mucosa normal, no drainage or sinus   tenderness  Throat:   Lips, mucosa, and tongue normal; teeth and gums normal  Neck:   Supple, no lymphadenopathy;  thyroid:  no   enlargement/tenderness/nodules; no carotid   bruit or JVD  Back:    Spine nontender, no curvature, ROM normal, no CVA     tenderness  Lungs:     Clear to auscultation bilaterally without wheezes, rales or     ronchi; respirations unlabored  Chest Wall:    No tenderness or deformity   Heart:    Regular rate and rhythm, S1 and S2 normal, no murmur, rub   or gallop.  Occasional ectopic beat  Breast Exam:    No chest wall tenderness, masses or gynecomastia  Abdomen:     Soft, non-tender, nondistended, normoactive bowel sounds,    no masses, no hepatosplenomegaly  Genitalia:    Normal male external genitalia without lesions.  Testicles without masses.  No inguinal hernias.  Rectal:    Deferred to Urologist.  Extremities:   No clubbing, cyanosis or edema.  Pain L shoulder with internal rotation against resistance.  Slight tenderness at lateral humerus  Pulses:   2+ and symmetric all extremities  Skin:   Skin color, texture, turgor normal.  Mole L cheek--uniform in color, but atypical in shape.  Has gotten larger, per patient  Lymph nodes:   Cervical, supraclavicular, and axillary nodes normal  Neurologic:   CNII-XII intact, normal strength, sensation and gait; reflexes 2+ and symmetric throughout          Psych:   Normal mood, affect, hygiene and  grooming.    ASSESSMENT/PLAN: 1. Routine general medical examination at a health care facility  POCT Urinalysis Dipstick, Visual acuity screening  2. Need for prophylactic vaccination and inoculation against influenza  Flu vaccine greater than or equal to 3yo preservative free IM  3. Anxiety state, unspecified  citalopram (CELEXA) 40 MG tablet  4. Need for Tdap vaccination  Tdap vaccine greater than or equal to 7yo IM  5. Prostate cancer  Comprehensive metabolic panel, CBC with Differential, PSA  6. Pure hypercholesterolemia  Lipid panel  7.  CAD (coronary artery disease)     Recommended at least 30 minutes of aerobic activity at least 5 days/week; proper sunscreen use reviewed; healthy diet and alcohol recommendations (less than or equal to 2 drinks/day) reviewed; regular seatbelt use; changing batteries in smoke detectors. Self-testicular exams. Immunization recommendations discussed--TdaP and flu shot given today.  Zostavax recommended.  Colonoscopy recommendations reviewed, UTD.  Risks/benefits of shingles vaccine reviewed.  Will check insurance  Derm appt recommended Ophtho recommended  SEND COPIES OF LABS TO DR Vernie Ammons AND DR Anne Fu

## 2011-11-03 NOTE — Patient Instructions (Addendum)
HEALTH MAINTENANCE RECOMMENDATIONS:  It is recommended that you get at least 30 minutes of aerobic exercise at least 5 days/week (for weight loss, you may need as much as 60-90 minutes). This can be any activity that gets your heart rate up. This can be divided in 10-15 minute intervals if needed, but try and build up your endurance at least once a week.  Weight bearing exercise is also recommended twice weekly.  Eat a healthy diet with lots of vegetables, fruits and fiber.  "Colorful" foods have a lot of vitamins (ie green vegetables, tomatoes, red peppers, etc).  Limit sweet tea, regular sodas and alcoholic beverages, all of which has a lot of calories and sugar.  Up to 2 alcoholic drinks daily may be beneficial for men (unless trying to lose weight, watch sugars).  Drink a lot of water.  Sunscreen of at least SPF 30 should be used on all sun-exposed parts of the skin when outside between the hours of 10 am and 4 pm (not just when at beach or pool, but even with exercise, golf, tennis, and yard work!)  Use a sunscreen that says "broad spectrum" so it covers both UVA and UVB rays, and make sure to reapply every 1-2 hours.  Remember to change the batteries in your smoke detectors when changing your clock times in the spring and fall.  Use your seat belt every time you are in a car, and please drive safely and not be distracted with cell phones and texting while driving.   Please schedule appointment with dermatologist. Please schedule a routine eye exam. Check your insurance regarding Zostavax coverage (shingles vaccine).  Call to schedule appointment for nurse visit if desired.

## 2011-11-04 ENCOUNTER — Encounter (HOSPITAL_COMMUNITY): Payer: Self-pay

## 2011-11-04 ENCOUNTER — Encounter: Payer: Self-pay | Admitting: Family Medicine

## 2011-11-06 ENCOUNTER — Encounter (HOSPITAL_COMMUNITY): Payer: Self-pay

## 2011-11-07 ENCOUNTER — Encounter (HOSPITAL_COMMUNITY): Payer: Self-pay

## 2011-11-11 ENCOUNTER — Encounter (HOSPITAL_COMMUNITY): Payer: Self-pay

## 2011-11-13 ENCOUNTER — Encounter (HOSPITAL_COMMUNITY): Payer: Self-pay

## 2011-11-14 ENCOUNTER — Encounter (HOSPITAL_COMMUNITY)
Admission: RE | Admit: 2011-11-14 | Discharge: 2011-11-14 | Disposition: A | Discharge status: Home / Self Care | Payer: Self-pay | Source: Ambulatory Visit | Attending: Cardiology | Admitting: Cardiology

## 2011-11-18 ENCOUNTER — Encounter (HOSPITAL_COMMUNITY)
Admission: RE | Admit: 2011-11-18 | Discharge: 2011-11-18 | Disposition: A | Discharge status: Home / Self Care | Payer: Self-pay | Source: Ambulatory Visit | Attending: Cardiology | Admitting: Cardiology

## 2011-11-20 ENCOUNTER — Encounter (HOSPITAL_COMMUNITY): Payer: Self-pay

## 2011-11-21 ENCOUNTER — Encounter (HOSPITAL_COMMUNITY): Payer: Self-pay

## 2011-11-25 ENCOUNTER — Encounter (HOSPITAL_COMMUNITY)
Admission: RE | Admit: 2011-11-25 | Discharge: 2011-11-25 | Disposition: A | Discharge status: Home / Self Care | Payer: Self-pay | Source: Ambulatory Visit | Attending: Cardiology | Admitting: Cardiology

## 2011-11-27 ENCOUNTER — Encounter (HOSPITAL_COMMUNITY)
Admission: RE | Admit: 2011-11-27 | Discharge: 2011-11-27 | Disposition: A | Discharge status: Home / Self Care | Payer: Self-pay | Source: Ambulatory Visit | Attending: Cardiology | Admitting: Cardiology

## 2011-11-28 ENCOUNTER — Encounter (HOSPITAL_COMMUNITY)
Admission: RE | Admit: 2011-11-28 | Discharge: 2011-11-28 | Disposition: A | Discharge status: Home / Self Care | Payer: Self-pay | Source: Ambulatory Visit | Attending: Cardiology | Admitting: Cardiology

## 2011-12-02 ENCOUNTER — Encounter (HOSPITAL_COMMUNITY)
Admission: RE | Admit: 2011-12-02 | Discharge: 2011-12-02 | Disposition: A | Discharge status: Home / Self Care | Payer: Self-pay | Source: Ambulatory Visit | Attending: Cardiology | Admitting: Cardiology

## 2011-12-02 ENCOUNTER — Other Ambulatory Visit: Payer: Self-pay | Admitting: Internal Medicine

## 2011-12-02 DIAGNOSIS — Z9861 Coronary angioplasty status: Secondary | ICD-10-CM | POA: Insufficient documentation

## 2011-12-02 DIAGNOSIS — I2510 Atherosclerotic heart disease of native coronary artery without angina pectoris: Secondary | ICD-10-CM | POA: Insufficient documentation

## 2011-12-02 DIAGNOSIS — F419 Anxiety disorder, unspecified: Secondary | ICD-10-CM

## 2011-12-02 DIAGNOSIS — E785 Hyperlipidemia, unspecified: Secondary | ICD-10-CM | POA: Insufficient documentation

## 2011-12-02 DIAGNOSIS — I10 Essential (primary) hypertension: Secondary | ICD-10-CM | POA: Insufficient documentation

## 2011-12-02 DIAGNOSIS — I2582 Chronic total occlusion of coronary artery: Secondary | ICD-10-CM | POA: Insufficient documentation

## 2011-12-02 DIAGNOSIS — I209 Angina pectoris, unspecified: Secondary | ICD-10-CM | POA: Insufficient documentation

## 2011-12-02 DIAGNOSIS — K219 Gastro-esophageal reflux disease without esophagitis: Secondary | ICD-10-CM | POA: Insufficient documentation

## 2011-12-02 DIAGNOSIS — Z5189 Encounter for other specified aftercare: Secondary | ICD-10-CM | POA: Insufficient documentation

## 2011-12-03 ENCOUNTER — Other Ambulatory Visit: Payer: Self-pay | Admitting: *Deleted

## 2011-12-03 DIAGNOSIS — F419 Anxiety disorder, unspecified: Secondary | ICD-10-CM

## 2011-12-03 MED ORDER — ALPRAZOLAM 0.5 MG PO TABS: 0.5000 mg | ORAL_TABLET | Freq: Every evening | ORAL | Status: DC | PRN

## 2011-12-04 ENCOUNTER — Encounter (HOSPITAL_COMMUNITY)
Admission: RE | Admit: 2011-12-04 | Discharge: 2011-12-04 | Disposition: A | Discharge status: Home / Self Care | Payer: Self-pay | Source: Ambulatory Visit | Attending: Cardiology | Admitting: Cardiology

## 2011-12-04 NOTE — Telephone Encounter (Signed)
Phoned in yesterday to pharmacy.

## 2011-12-04 NOTE — Telephone Encounter (Signed)
Will you phone this in for me? Or have you already done it?

## 2011-12-05 ENCOUNTER — Encounter (HOSPITAL_COMMUNITY)
Admission: RE | Admit: 2011-12-05 | Discharge: 2011-12-05 | Disposition: A | Discharge status: Home / Self Care | Payer: Self-pay | Source: Ambulatory Visit | Attending: Cardiology | Admitting: Cardiology

## 2011-12-09 ENCOUNTER — Encounter (HOSPITAL_COMMUNITY)
Admission: RE | Admit: 2011-12-09 | Discharge: 2011-12-09 | Disposition: A | Discharge status: Home / Self Care | Payer: Self-pay | Source: Ambulatory Visit | Attending: Cardiology | Admitting: Cardiology

## 2011-12-11 ENCOUNTER — Encounter (HOSPITAL_COMMUNITY)
Admission: RE | Admit: 2011-12-11 | Discharge: 2011-12-11 | Disposition: A | Discharge status: Home / Self Care | Payer: Self-pay | Source: Ambulatory Visit | Attending: Cardiology | Admitting: Cardiology

## 2011-12-12 ENCOUNTER — Encounter (HOSPITAL_COMMUNITY)
Admission: RE | Admit: 2011-12-12 | Discharge: 2011-12-12 | Disposition: A | Discharge status: Home / Self Care | Payer: Self-pay | Source: Ambulatory Visit | Attending: Cardiology | Admitting: Cardiology

## 2011-12-16 ENCOUNTER — Encounter (HOSPITAL_COMMUNITY)
Admission: RE | Admit: 2011-12-16 | Discharge: 2011-12-16 | Disposition: A | Discharge status: Home / Self Care | Payer: Self-pay | Source: Ambulatory Visit | Attending: Cardiology | Admitting: Cardiology

## 2011-12-18 ENCOUNTER — Encounter (HOSPITAL_COMMUNITY)
Admission: RE | Admit: 2011-12-18 | Discharge: 2011-12-18 | Disposition: A | Discharge status: Home / Self Care | Payer: Self-pay | Source: Ambulatory Visit | Attending: Cardiology | Admitting: Cardiology

## 2011-12-19 ENCOUNTER — Encounter (HOSPITAL_COMMUNITY): Payer: Self-pay

## 2011-12-23 ENCOUNTER — Encounter (HOSPITAL_COMMUNITY): Payer: Self-pay

## 2011-12-25 ENCOUNTER — Encounter (HOSPITAL_COMMUNITY): Payer: Self-pay

## 2011-12-26 ENCOUNTER — Encounter (HOSPITAL_COMMUNITY): Payer: Self-pay

## 2011-12-30 ENCOUNTER — Encounter (HOSPITAL_COMMUNITY): Payer: Self-pay

## 2012-01-01 ENCOUNTER — Encounter (HOSPITAL_COMMUNITY)
Admission: RE | Admit: 2012-01-01 | Discharge: 2012-01-01 | Disposition: A | Discharge status: Home / Self Care | Payer: Self-pay | Source: Ambulatory Visit | Attending: Cardiology | Admitting: Cardiology

## 2012-01-01 DIAGNOSIS — E785 Hyperlipidemia, unspecified: Secondary | ICD-10-CM | POA: Insufficient documentation

## 2012-01-01 DIAGNOSIS — Z9861 Coronary angioplasty status: Secondary | ICD-10-CM | POA: Insufficient documentation

## 2012-01-01 DIAGNOSIS — I10 Essential (primary) hypertension: Secondary | ICD-10-CM | POA: Insufficient documentation

## 2012-01-01 DIAGNOSIS — Z5189 Encounter for other specified aftercare: Secondary | ICD-10-CM | POA: Insufficient documentation

## 2012-01-01 DIAGNOSIS — I2582 Chronic total occlusion of coronary artery: Secondary | ICD-10-CM | POA: Insufficient documentation

## 2012-01-01 DIAGNOSIS — I209 Angina pectoris, unspecified: Secondary | ICD-10-CM | POA: Insufficient documentation

## 2012-01-01 DIAGNOSIS — I2510 Atherosclerotic heart disease of native coronary artery without angina pectoris: Secondary | ICD-10-CM | POA: Insufficient documentation

## 2012-01-01 DIAGNOSIS — K219 Gastro-esophageal reflux disease without esophagitis: Secondary | ICD-10-CM | POA: Insufficient documentation

## 2012-01-02 ENCOUNTER — Encounter (HOSPITAL_COMMUNITY)
Admission: RE | Admit: 2012-01-02 | Discharge: 2012-01-02 | Disposition: A | Discharge status: Home / Self Care | Payer: Self-pay | Source: Ambulatory Visit | Attending: Cardiology | Admitting: Cardiology

## 2012-01-06 ENCOUNTER — Encounter (HOSPITAL_COMMUNITY)
Admission: RE | Admit: 2012-01-06 | Discharge: 2012-01-06 | Disposition: A | Discharge status: Home / Self Care | Payer: Self-pay | Source: Ambulatory Visit | Attending: Cardiology | Admitting: Cardiology

## 2012-01-08 ENCOUNTER — Encounter (HOSPITAL_COMMUNITY)
Admission: RE | Admit: 2012-01-08 | Discharge: 2012-01-08 | Disposition: A | Discharge status: Home / Self Care | Payer: Self-pay | Source: Ambulatory Visit | Attending: Cardiology | Admitting: Cardiology

## 2012-01-09 ENCOUNTER — Encounter (HOSPITAL_COMMUNITY)
Admission: RE | Admit: 2012-01-09 | Discharge: 2012-01-09 | Disposition: A | Discharge status: Home / Self Care | Payer: Self-pay | Source: Ambulatory Visit | Attending: Cardiology | Admitting: Cardiology

## 2012-01-13 ENCOUNTER — Encounter (HOSPITAL_COMMUNITY)
Admission: RE | Admit: 2012-01-13 | Discharge: 2012-01-13 | Disposition: A | Discharge status: Home / Self Care | Payer: Self-pay | Source: Ambulatory Visit | Attending: Cardiology | Admitting: Cardiology

## 2012-01-15 ENCOUNTER — Encounter (HOSPITAL_COMMUNITY)
Admission: RE | Admit: 2012-01-15 | Discharge: 2012-01-15 | Disposition: A | Discharge status: Home / Self Care | Payer: Self-pay | Source: Ambulatory Visit | Attending: Cardiology | Admitting: Cardiology

## 2012-01-16 ENCOUNTER — Encounter (HOSPITAL_COMMUNITY): Payer: Self-pay

## 2012-01-20 ENCOUNTER — Encounter (HOSPITAL_COMMUNITY)
Admission: RE | Admit: 2012-01-20 | Discharge: 2012-01-20 | Disposition: A | Discharge status: Home / Self Care | Payer: Self-pay | Source: Ambulatory Visit | Attending: Cardiology | Admitting: Cardiology

## 2012-01-22 ENCOUNTER — Encounter (HOSPITAL_COMMUNITY): Payer: Self-pay

## 2012-01-23 ENCOUNTER — Encounter (HOSPITAL_COMMUNITY): Payer: Self-pay

## 2012-01-27 ENCOUNTER — Encounter (HOSPITAL_COMMUNITY): Payer: Self-pay

## 2012-01-29 ENCOUNTER — Encounter (HOSPITAL_COMMUNITY)
Admission: RE | Admit: 2012-01-29 | Discharge: 2012-01-29 | Disposition: A | Discharge status: Home / Self Care | Payer: Self-pay | Source: Ambulatory Visit | Attending: Cardiology | Admitting: Cardiology

## 2012-01-30 ENCOUNTER — Encounter (HOSPITAL_COMMUNITY)
Admission: RE | Admit: 2012-01-30 | Discharge: 2012-01-30 | Disposition: A | Discharge status: Home / Self Care | Payer: Self-pay | Source: Ambulatory Visit | Attending: Cardiology | Admitting: Cardiology

## 2012-01-30 DIAGNOSIS — K219 Gastro-esophageal reflux disease without esophagitis: Secondary | ICD-10-CM | POA: Insufficient documentation

## 2012-01-30 DIAGNOSIS — I2582 Chronic total occlusion of coronary artery: Secondary | ICD-10-CM | POA: Insufficient documentation

## 2012-01-30 DIAGNOSIS — E785 Hyperlipidemia, unspecified: Secondary | ICD-10-CM | POA: Insufficient documentation

## 2012-01-30 DIAGNOSIS — I10 Essential (primary) hypertension: Secondary | ICD-10-CM | POA: Insufficient documentation

## 2012-01-30 DIAGNOSIS — Z9861 Coronary angioplasty status: Secondary | ICD-10-CM | POA: Insufficient documentation

## 2012-01-30 DIAGNOSIS — I2510 Atherosclerotic heart disease of native coronary artery without angina pectoris: Secondary | ICD-10-CM | POA: Insufficient documentation

## 2012-01-30 DIAGNOSIS — Z5189 Encounter for other specified aftercare: Secondary | ICD-10-CM | POA: Insufficient documentation

## 2012-01-30 DIAGNOSIS — I209 Angina pectoris, unspecified: Secondary | ICD-10-CM | POA: Insufficient documentation

## 2012-02-03 ENCOUNTER — Encounter (HOSPITAL_COMMUNITY): Payer: Self-pay

## 2012-02-05 ENCOUNTER — Encounter (HOSPITAL_COMMUNITY)
Admission: RE | Admit: 2012-02-05 | Discharge: 2012-02-05 | Disposition: A | Discharge status: Home / Self Care | Payer: Self-pay | Source: Ambulatory Visit | Attending: Cardiology | Admitting: Cardiology

## 2012-02-06 ENCOUNTER — Encounter (HOSPITAL_COMMUNITY)
Admission: RE | Admit: 2012-02-06 | Discharge: 2012-02-06 | Disposition: A | Discharge status: Home / Self Care | Payer: Self-pay | Source: Ambulatory Visit | Attending: Cardiology | Admitting: Cardiology

## 2012-02-10 ENCOUNTER — Encounter (HOSPITAL_COMMUNITY)
Admission: RE | Admit: 2012-02-10 | Discharge: 2012-02-10 | Disposition: A | Discharge status: Home / Self Care | Payer: Self-pay | Source: Ambulatory Visit | Attending: Cardiology | Admitting: Cardiology

## 2012-02-12 ENCOUNTER — Encounter (HOSPITAL_COMMUNITY)
Admission: RE | Admit: 2012-02-12 | Discharge: 2012-02-12 | Disposition: A | Discharge status: Home / Self Care | Payer: Self-pay | Source: Ambulatory Visit | Attending: Cardiology | Admitting: Cardiology

## 2012-02-13 ENCOUNTER — Encounter (HOSPITAL_COMMUNITY)
Admission: RE | Admit: 2012-02-13 | Discharge: 2012-02-13 | Disposition: A | Discharge status: Home / Self Care | Payer: Self-pay | Source: Ambulatory Visit | Attending: Cardiology | Admitting: Cardiology

## 2012-02-13 ENCOUNTER — Telehealth: Payer: Self-pay | Admitting: Family Medicine

## 2012-02-13 DIAGNOSIS — E7800 Pure hypercholesterolemia, unspecified: Secondary | ICD-10-CM

## 2012-02-13 MED ORDER — ATORVASTATIN CALCIUM 20 MG PO TABS: 20.0000 mg | ORAL_TABLET | Freq: Every day | ORAL | Status: DC

## 2012-02-13 NOTE — Telephone Encounter (Signed)
done

## 2012-02-17 ENCOUNTER — Other Ambulatory Visit: Payer: Self-pay | Admitting: Family Medicine

## 2012-02-17 ENCOUNTER — Encounter (HOSPITAL_COMMUNITY)
Admission: RE | Admit: 2012-02-17 | Discharge: 2012-02-17 | Disposition: A | Discharge status: Home / Self Care | Payer: Self-pay | Source: Ambulatory Visit | Attending: Cardiology | Admitting: Cardiology

## 2012-02-18 NOTE — Telephone Encounter (Signed)
Is this ok?

## 2012-02-18 NOTE — Telephone Encounter (Signed)
done

## 2012-02-19 ENCOUNTER — Encounter (HOSPITAL_COMMUNITY)
Admission: RE | Admit: 2012-02-19 | Discharge: 2012-02-19 | Disposition: A | Discharge status: Home / Self Care | Payer: Self-pay | Source: Ambulatory Visit | Attending: Cardiology | Admitting: Cardiology

## 2012-02-20 ENCOUNTER — Encounter (HOSPITAL_COMMUNITY)
Admission: RE | Admit: 2012-02-20 | Discharge: 2012-02-20 | Disposition: A | Discharge status: Home / Self Care | Payer: Self-pay | Source: Ambulatory Visit | Attending: Cardiology | Admitting: Cardiology

## 2012-02-24 ENCOUNTER — Encounter (HOSPITAL_COMMUNITY)
Admission: RE | Admit: 2012-02-24 | Discharge: 2012-02-24 | Disposition: A | Discharge status: Home / Self Care | Payer: Self-pay | Source: Ambulatory Visit | Attending: Cardiology | Admitting: Cardiology

## 2012-02-26 ENCOUNTER — Encounter (HOSPITAL_COMMUNITY)
Admission: RE | Admit: 2012-02-26 | Discharge: 2012-02-26 | Disposition: A | Discharge status: Home / Self Care | Payer: Self-pay | Source: Ambulatory Visit | Attending: Cardiology | Admitting: Cardiology

## 2012-02-27 ENCOUNTER — Encounter (HOSPITAL_COMMUNITY)
Admission: RE | Admit: 2012-02-27 | Discharge: 2012-02-27 | Disposition: A | Discharge status: Home / Self Care | Payer: Self-pay | Source: Ambulatory Visit | Attending: Cardiology | Admitting: Cardiology

## 2012-02-27 DIAGNOSIS — Z5189 Encounter for other specified aftercare: Secondary | ICD-10-CM | POA: Insufficient documentation

## 2012-02-27 DIAGNOSIS — E785 Hyperlipidemia, unspecified: Secondary | ICD-10-CM | POA: Insufficient documentation

## 2012-02-27 DIAGNOSIS — I10 Essential (primary) hypertension: Secondary | ICD-10-CM | POA: Insufficient documentation

## 2012-02-27 DIAGNOSIS — I2582 Chronic total occlusion of coronary artery: Secondary | ICD-10-CM | POA: Insufficient documentation

## 2012-02-27 DIAGNOSIS — I2510 Atherosclerotic heart disease of native coronary artery without angina pectoris: Secondary | ICD-10-CM | POA: Insufficient documentation

## 2012-02-27 DIAGNOSIS — Z9861 Coronary angioplasty status: Secondary | ICD-10-CM | POA: Insufficient documentation

## 2012-02-27 DIAGNOSIS — K219 Gastro-esophageal reflux disease without esophagitis: Secondary | ICD-10-CM | POA: Insufficient documentation

## 2012-02-27 DIAGNOSIS — I209 Angina pectoris, unspecified: Secondary | ICD-10-CM | POA: Insufficient documentation

## 2012-03-02 ENCOUNTER — Encounter (HOSPITAL_COMMUNITY)
Admission: RE | Admit: 2012-03-02 | Discharge: 2012-03-02 | Disposition: A | Discharge status: Home / Self Care | Payer: Self-pay | Source: Ambulatory Visit | Attending: Cardiology | Admitting: Cardiology

## 2012-03-04 ENCOUNTER — Encounter (HOSPITAL_COMMUNITY)
Admission: RE | Admit: 2012-03-04 | Discharge: 2012-03-04 | Disposition: A | Discharge status: Home / Self Care | Payer: Self-pay | Source: Ambulatory Visit | Attending: Cardiology | Admitting: Cardiology

## 2012-03-05 ENCOUNTER — Encounter (HOSPITAL_COMMUNITY)
Admission: RE | Admit: 2012-03-05 | Discharge: 2012-03-05 | Disposition: A | Discharge status: Home / Self Care | Payer: Self-pay | Source: Ambulatory Visit | Attending: Cardiology | Admitting: Cardiology

## 2012-03-09 ENCOUNTER — Encounter (HOSPITAL_COMMUNITY)
Admission: RE | Admit: 2012-03-09 | Discharge: 2012-03-09 | Disposition: A | Discharge status: Home / Self Care | Payer: Self-pay | Source: Ambulatory Visit | Attending: Cardiology | Admitting: Cardiology

## 2012-03-11 ENCOUNTER — Encounter (HOSPITAL_COMMUNITY): Payer: Self-pay

## 2012-03-12 ENCOUNTER — Encounter (HOSPITAL_COMMUNITY)
Admission: RE | Admit: 2012-03-12 | Discharge: 2012-03-12 | Disposition: A | Discharge status: Home / Self Care | Payer: Self-pay | Source: Ambulatory Visit | Attending: Cardiology | Admitting: Cardiology

## 2012-03-16 ENCOUNTER — Encounter (HOSPITAL_COMMUNITY)
Admission: RE | Admit: 2012-03-16 | Discharge: 2012-03-16 | Disposition: A | Discharge status: Home / Self Care | Payer: Self-pay | Source: Ambulatory Visit | Attending: Cardiology | Admitting: Cardiology

## 2012-03-18 ENCOUNTER — Encounter (HOSPITAL_COMMUNITY)
Admission: RE | Admit: 2012-03-18 | Discharge: 2012-03-18 | Disposition: A | Discharge status: Home / Self Care | Payer: Self-pay | Source: Ambulatory Visit | Attending: Cardiology | Admitting: Cardiology

## 2012-03-19 ENCOUNTER — Encounter (HOSPITAL_COMMUNITY)
Admission: RE | Admit: 2012-03-19 | Discharge: 2012-03-19 | Disposition: A | Discharge status: Home / Self Care | Payer: Self-pay | Source: Ambulatory Visit | Attending: Cardiology | Admitting: Cardiology

## 2012-03-23 ENCOUNTER — Encounter (HOSPITAL_COMMUNITY)
Admission: RE | Admit: 2012-03-23 | Discharge: 2012-03-23 | Disposition: A | Discharge status: Home / Self Care | Payer: Self-pay | Source: Ambulatory Visit | Attending: Cardiology | Admitting: Cardiology

## 2012-03-25 ENCOUNTER — Encounter (HOSPITAL_COMMUNITY)
Admission: RE | Admit: 2012-03-25 | Discharge: 2012-03-25 | Disposition: A | Discharge status: Home / Self Care | Payer: Self-pay | Source: Ambulatory Visit | Attending: Cardiology | Admitting: Cardiology

## 2012-03-26 ENCOUNTER — Encounter (HOSPITAL_COMMUNITY): Payer: Self-pay

## 2012-03-30 ENCOUNTER — Encounter (HOSPITAL_COMMUNITY)
Admission: RE | Admit: 2012-03-30 | Discharge: 2012-03-30 | Disposition: A | Discharge status: Home / Self Care | Payer: Self-pay | Source: Ambulatory Visit | Attending: Cardiology | Admitting: Cardiology

## 2012-03-30 DIAGNOSIS — I2582 Chronic total occlusion of coronary artery: Secondary | ICD-10-CM | POA: Insufficient documentation

## 2012-03-30 DIAGNOSIS — I209 Angina pectoris, unspecified: Secondary | ICD-10-CM | POA: Insufficient documentation

## 2012-03-30 DIAGNOSIS — I10 Essential (primary) hypertension: Secondary | ICD-10-CM | POA: Insufficient documentation

## 2012-03-30 DIAGNOSIS — Z9861 Coronary angioplasty status: Secondary | ICD-10-CM | POA: Insufficient documentation

## 2012-03-30 DIAGNOSIS — I2510 Atherosclerotic heart disease of native coronary artery without angina pectoris: Secondary | ICD-10-CM | POA: Insufficient documentation

## 2012-03-30 DIAGNOSIS — E785 Hyperlipidemia, unspecified: Secondary | ICD-10-CM | POA: Insufficient documentation

## 2012-03-30 DIAGNOSIS — Z5189 Encounter for other specified aftercare: Secondary | ICD-10-CM | POA: Insufficient documentation

## 2012-03-30 DIAGNOSIS — K219 Gastro-esophageal reflux disease without esophagitis: Secondary | ICD-10-CM | POA: Insufficient documentation

## 2012-04-01 ENCOUNTER — Encounter (HOSPITAL_COMMUNITY)
Admission: RE | Admit: 2012-04-01 | Discharge: 2012-04-01 | Disposition: A | Discharge status: Home / Self Care | Payer: Self-pay | Source: Ambulatory Visit | Attending: Cardiology | Admitting: Cardiology

## 2012-04-02 ENCOUNTER — Encounter (HOSPITAL_COMMUNITY)
Admission: RE | Admit: 2012-04-02 | Discharge: 2012-04-02 | Disposition: A | Discharge status: Home / Self Care | Payer: Self-pay | Source: Ambulatory Visit | Attending: Cardiology | Admitting: Cardiology

## 2012-04-06 ENCOUNTER — Encounter (HOSPITAL_COMMUNITY)
Admission: RE | Admit: 2012-04-06 | Discharge: 2012-04-06 | Disposition: A | Discharge status: Home / Self Care | Payer: Self-pay | Source: Ambulatory Visit | Attending: Cardiology | Admitting: Cardiology

## 2012-04-08 ENCOUNTER — Encounter (HOSPITAL_COMMUNITY)
Admission: RE | Admit: 2012-04-08 | Discharge: 2012-04-08 | Disposition: A | Discharge status: Home / Self Care | Payer: Self-pay | Source: Ambulatory Visit | Attending: Cardiology | Admitting: Cardiology

## 2012-04-09 ENCOUNTER — Encounter (HOSPITAL_COMMUNITY)
Admission: RE | Admit: 2012-04-09 | Discharge: 2012-04-09 | Disposition: A | Discharge status: Home / Self Care | Payer: Self-pay | Source: Ambulatory Visit | Attending: Cardiology | Admitting: Cardiology

## 2012-04-13 ENCOUNTER — Encounter (HOSPITAL_COMMUNITY): Payer: Self-pay

## 2012-04-14 ENCOUNTER — Encounter (HOSPITAL_COMMUNITY)
Admission: RE | Admit: 2012-04-14 | Discharge: 2012-04-14 | Disposition: A | Discharge status: Home / Self Care | Payer: Self-pay | Source: Ambulatory Visit | Attending: Cardiology | Admitting: Cardiology

## 2012-04-15 ENCOUNTER — Encounter (HOSPITAL_COMMUNITY)
Admission: RE | Admit: 2012-04-15 | Discharge: 2012-04-15 | Disposition: A | Discharge status: Home / Self Care | Payer: Self-pay | Source: Ambulatory Visit | Attending: Cardiology | Admitting: Cardiology

## 2012-04-16 ENCOUNTER — Encounter (HOSPITAL_COMMUNITY)
Admission: RE | Admit: 2012-04-16 | Discharge: 2012-04-16 | Disposition: A | Discharge status: Home / Self Care | Payer: Self-pay | Source: Ambulatory Visit | Attending: Cardiology | Admitting: Cardiology

## 2012-04-20 ENCOUNTER — Encounter (HOSPITAL_COMMUNITY)
Admission: RE | Admit: 2012-04-20 | Discharge: 2012-04-20 | Disposition: A | Discharge status: Home / Self Care | Payer: Self-pay | Source: Ambulatory Visit | Attending: Cardiology | Admitting: Cardiology

## 2012-04-22 ENCOUNTER — Encounter (HOSPITAL_COMMUNITY): Payer: Self-pay

## 2012-04-23 ENCOUNTER — Encounter (HOSPITAL_COMMUNITY)
Admission: RE | Admit: 2012-04-23 | Discharge: 2012-04-23 | Disposition: A | Discharge status: Home / Self Care | Payer: Self-pay | Source: Ambulatory Visit | Attending: Cardiology | Admitting: Cardiology

## 2012-04-27 ENCOUNTER — Encounter (HOSPITAL_COMMUNITY)
Admission: RE | Admit: 2012-04-27 | Discharge: 2012-04-27 | Disposition: A | Discharge status: Home / Self Care | Payer: Self-pay | Source: Ambulatory Visit | Attending: Cardiology | Admitting: Cardiology

## 2012-04-29 ENCOUNTER — Encounter (HOSPITAL_COMMUNITY)
Admission: RE | Admit: 2012-04-29 | Discharge: 2012-04-29 | Disposition: A | Discharge status: Home / Self Care | Payer: Self-pay | Source: Ambulatory Visit | Attending: Cardiology | Admitting: Cardiology

## 2012-04-29 DIAGNOSIS — I2510 Atherosclerotic heart disease of native coronary artery without angina pectoris: Secondary | ICD-10-CM | POA: Insufficient documentation

## 2012-04-29 DIAGNOSIS — I2582 Chronic total occlusion of coronary artery: Secondary | ICD-10-CM | POA: Insufficient documentation

## 2012-04-29 DIAGNOSIS — E785 Hyperlipidemia, unspecified: Secondary | ICD-10-CM | POA: Insufficient documentation

## 2012-04-29 DIAGNOSIS — I209 Angina pectoris, unspecified: Secondary | ICD-10-CM | POA: Insufficient documentation

## 2012-04-29 DIAGNOSIS — Z9861 Coronary angioplasty status: Secondary | ICD-10-CM | POA: Insufficient documentation

## 2012-04-29 DIAGNOSIS — I10 Essential (primary) hypertension: Secondary | ICD-10-CM | POA: Insufficient documentation

## 2012-04-29 DIAGNOSIS — Z5189 Encounter for other specified aftercare: Secondary | ICD-10-CM | POA: Insufficient documentation

## 2012-04-29 DIAGNOSIS — K219 Gastro-esophageal reflux disease without esophagitis: Secondary | ICD-10-CM | POA: Insufficient documentation

## 2012-04-30 ENCOUNTER — Encounter (HOSPITAL_COMMUNITY): Payer: Self-pay

## 2012-05-04 ENCOUNTER — Encounter (HOSPITAL_COMMUNITY)
Admission: RE | Admit: 2012-05-04 | Discharge: 2012-05-04 | Disposition: A | Discharge status: Home / Self Care | Payer: Self-pay | Source: Ambulatory Visit | Attending: Cardiology | Admitting: Cardiology

## 2012-05-05 ENCOUNTER — Telehealth: Payer: Self-pay | Admitting: Family Medicine

## 2012-05-05 DIAGNOSIS — E7800 Pure hypercholesterolemia, unspecified: Secondary | ICD-10-CM

## 2012-05-05 MED ORDER — ATORVASTATIN CALCIUM 20 MG PO TABS: 20.0000 mg | ORAL_TABLET | Freq: Every day | ORAL | Status: DC

## 2012-05-05 NOTE — Telephone Encounter (Signed)
rcd fax request Atorvastatin 20 mg 1 po qd  #90   With [facility]

## 2012-05-06 ENCOUNTER — Encounter (HOSPITAL_COMMUNITY): Payer: Self-pay

## 2012-05-07 ENCOUNTER — Encounter (HOSPITAL_COMMUNITY)
Admission: RE | Admit: 2012-05-07 | Discharge: 2012-05-07 | Disposition: A | Discharge status: Home / Self Care | Payer: Self-pay | Source: Ambulatory Visit | Attending: Cardiology | Admitting: Cardiology

## 2012-05-11 ENCOUNTER — Encounter (HOSPITAL_COMMUNITY)
Admission: RE | Admit: 2012-05-11 | Discharge: 2012-05-11 | Disposition: A | Discharge status: Home / Self Care | Payer: Self-pay | Source: Ambulatory Visit | Attending: Cardiology | Admitting: Cardiology

## 2012-05-13 ENCOUNTER — Encounter (HOSPITAL_COMMUNITY)
Admission: RE | Admit: 2012-05-13 | Discharge: 2012-05-13 | Disposition: A | Discharge status: Home / Self Care | Payer: Self-pay | Source: Ambulatory Visit | Attending: Cardiology | Admitting: Cardiology

## 2012-05-14 ENCOUNTER — Encounter (HOSPITAL_COMMUNITY)
Admission: RE | Admit: 2012-05-14 | Discharge: 2012-05-14 | Disposition: A | Discharge status: Home / Self Care | Payer: Self-pay | Source: Ambulatory Visit | Attending: Cardiology | Admitting: Cardiology

## 2012-05-18 ENCOUNTER — Encounter (HOSPITAL_COMMUNITY)
Admission: RE | Admit: 2012-05-18 | Discharge: 2012-05-18 | Disposition: A | Discharge status: Home / Self Care | Payer: Self-pay | Source: Ambulatory Visit | Attending: Cardiology | Admitting: Cardiology

## 2012-05-20 ENCOUNTER — Encounter (HOSPITAL_COMMUNITY): Payer: Self-pay

## 2012-05-21 ENCOUNTER — Encounter (HOSPITAL_COMMUNITY)
Admission: RE | Admit: 2012-05-21 | Discharge: 2012-05-21 | Disposition: A | Discharge status: Home / Self Care | Payer: Self-pay | Source: Ambulatory Visit | Attending: Cardiology | Admitting: Cardiology

## 2012-05-25 ENCOUNTER — Encounter (HOSPITAL_COMMUNITY)
Admission: RE | Admit: 2012-05-25 | Discharge: 2012-05-25 | Disposition: A | Discharge status: Home / Self Care | Payer: Self-pay | Source: Ambulatory Visit | Attending: Cardiology | Admitting: Cardiology

## 2012-05-27 ENCOUNTER — Encounter (HOSPITAL_COMMUNITY)
Admission: RE | Admit: 2012-05-27 | Discharge: 2012-05-27 | Disposition: A | Discharge status: Home / Self Care | Payer: Self-pay | Source: Ambulatory Visit | Attending: Cardiology | Admitting: Cardiology

## 2012-05-28 ENCOUNTER — Encounter (HOSPITAL_COMMUNITY)
Admission: RE | Admit: 2012-05-28 | Discharge: 2012-05-28 | Disposition: A | Discharge status: Home / Self Care | Payer: Self-pay | Source: Ambulatory Visit | Attending: Cardiology | Admitting: Cardiology

## 2012-06-01 ENCOUNTER — Encounter (HOSPITAL_COMMUNITY)
Admission: RE | Admit: 2012-06-01 | Discharge: 2012-06-01 | Disposition: A | Discharge status: Home / Self Care | Payer: Self-pay | Source: Ambulatory Visit | Attending: Cardiology | Admitting: Cardiology

## 2012-06-01 DIAGNOSIS — K219 Gastro-esophageal reflux disease without esophagitis: Secondary | ICD-10-CM | POA: Insufficient documentation

## 2012-06-01 DIAGNOSIS — Z9861 Coronary angioplasty status: Secondary | ICD-10-CM | POA: Insufficient documentation

## 2012-06-01 DIAGNOSIS — Z5189 Encounter for other specified aftercare: Secondary | ICD-10-CM | POA: Insufficient documentation

## 2012-06-01 DIAGNOSIS — I2510 Atherosclerotic heart disease of native coronary artery without angina pectoris: Secondary | ICD-10-CM | POA: Insufficient documentation

## 2012-06-01 DIAGNOSIS — I10 Essential (primary) hypertension: Secondary | ICD-10-CM | POA: Insufficient documentation

## 2012-06-01 DIAGNOSIS — E785 Hyperlipidemia, unspecified: Secondary | ICD-10-CM | POA: Insufficient documentation

## 2012-06-01 DIAGNOSIS — I209 Angina pectoris, unspecified: Secondary | ICD-10-CM | POA: Insufficient documentation

## 2012-06-01 DIAGNOSIS — I2582 Chronic total occlusion of coronary artery: Secondary | ICD-10-CM | POA: Insufficient documentation

## 2012-06-03 ENCOUNTER — Encounter (HOSPITAL_COMMUNITY)
Admission: RE | Admit: 2012-06-03 | Discharge: 2012-06-03 | Disposition: A | Discharge status: Home / Self Care | Payer: Self-pay | Source: Ambulatory Visit | Attending: Cardiology | Admitting: Cardiology

## 2012-06-04 ENCOUNTER — Encounter (HOSPITAL_COMMUNITY): Payer: Self-pay

## 2012-06-08 ENCOUNTER — Encounter (HOSPITAL_COMMUNITY)
Admission: RE | Admit: 2012-06-08 | Discharge: 2012-06-08 | Disposition: A | Discharge status: Home / Self Care | Payer: Self-pay | Source: Ambulatory Visit | Attending: Cardiology | Admitting: Cardiology

## 2012-06-10 ENCOUNTER — Encounter (HOSPITAL_COMMUNITY)
Admission: RE | Admit: 2012-06-10 | Discharge: 2012-06-10 | Disposition: A | Discharge status: Home / Self Care | Payer: Self-pay | Source: Ambulatory Visit | Attending: Cardiology | Admitting: Cardiology

## 2012-06-11 ENCOUNTER — Encounter (HOSPITAL_COMMUNITY): Payer: Self-pay

## 2012-06-15 ENCOUNTER — Encounter (HOSPITAL_COMMUNITY)
Admission: RE | Admit: 2012-06-15 | Discharge: 2012-06-15 | Disposition: A | Discharge status: Home / Self Care | Payer: Self-pay | Source: Ambulatory Visit | Attending: Cardiology | Admitting: Cardiology

## 2012-06-17 ENCOUNTER — Encounter (HOSPITAL_COMMUNITY): Payer: Self-pay

## 2012-06-18 ENCOUNTER — Encounter (HOSPITAL_COMMUNITY): Payer: Self-pay

## 2012-06-22 ENCOUNTER — Encounter (HOSPITAL_COMMUNITY)
Admission: RE | Admit: 2012-06-22 | Discharge: 2012-06-22 | Disposition: A | Discharge status: Home / Self Care | Payer: Self-pay | Source: Ambulatory Visit | Attending: Cardiology | Admitting: Cardiology

## 2012-06-24 ENCOUNTER — Encounter (HOSPITAL_COMMUNITY)
Admission: RE | Admit: 2012-06-24 | Discharge: 2012-06-24 | Disposition: A | Discharge status: Home / Self Care | Payer: Self-pay | Source: Ambulatory Visit | Attending: Cardiology | Admitting: Cardiology

## 2012-06-25 ENCOUNTER — Encounter (HOSPITAL_COMMUNITY)
Admission: RE | Admit: 2012-06-25 | Discharge: 2012-06-25 | Disposition: A | Discharge status: Home / Self Care | Payer: Self-pay | Source: Ambulatory Visit | Attending: Cardiology | Admitting: Cardiology

## 2012-06-29 ENCOUNTER — Encounter (HOSPITAL_COMMUNITY): Payer: Self-pay

## 2012-06-29 DIAGNOSIS — Z9861 Coronary angioplasty status: Secondary | ICD-10-CM | POA: Insufficient documentation

## 2012-06-29 DIAGNOSIS — I10 Essential (primary) hypertension: Secondary | ICD-10-CM | POA: Insufficient documentation

## 2012-06-29 DIAGNOSIS — Z5189 Encounter for other specified aftercare: Secondary | ICD-10-CM | POA: Insufficient documentation

## 2012-06-29 DIAGNOSIS — I2510 Atherosclerotic heart disease of native coronary artery without angina pectoris: Secondary | ICD-10-CM | POA: Insufficient documentation

## 2012-06-29 DIAGNOSIS — I209 Angina pectoris, unspecified: Secondary | ICD-10-CM | POA: Insufficient documentation

## 2012-06-29 DIAGNOSIS — I2582 Chronic total occlusion of coronary artery: Secondary | ICD-10-CM | POA: Insufficient documentation

## 2012-06-29 DIAGNOSIS — K219 Gastro-esophageal reflux disease without esophagitis: Secondary | ICD-10-CM | POA: Insufficient documentation

## 2012-06-29 DIAGNOSIS — E785 Hyperlipidemia, unspecified: Secondary | ICD-10-CM | POA: Insufficient documentation

## 2012-07-01 ENCOUNTER — Encounter (HOSPITAL_COMMUNITY): Payer: Self-pay

## 2012-07-02 ENCOUNTER — Encounter (HOSPITAL_COMMUNITY): Payer: Self-pay

## 2012-07-06 ENCOUNTER — Encounter (HOSPITAL_COMMUNITY)
Admission: RE | Admit: 2012-07-06 | Discharge: 2012-07-06 | Disposition: A | Discharge status: Home / Self Care | Payer: Self-pay | Source: Ambulatory Visit | Attending: Cardiology | Admitting: Cardiology

## 2012-07-08 ENCOUNTER — Encounter (HOSPITAL_COMMUNITY)
Admission: RE | Admit: 2012-07-08 | Discharge: 2012-07-08 | Disposition: A | Discharge status: Home / Self Care | Payer: Self-pay | Source: Ambulatory Visit | Attending: Cardiology | Admitting: Cardiology

## 2012-07-09 ENCOUNTER — Encounter (HOSPITAL_COMMUNITY)
Admission: RE | Admit: 2012-07-09 | Discharge: 2012-07-09 | Disposition: A | Discharge status: Home / Self Care | Payer: Self-pay | Source: Ambulatory Visit | Attending: Cardiology | Admitting: Cardiology

## 2012-07-13 ENCOUNTER — Encounter (HOSPITAL_COMMUNITY)
Admission: RE | Admit: 2012-07-13 | Discharge: 2012-07-13 | Disposition: A | Discharge status: Home / Self Care | Payer: Self-pay | Source: Ambulatory Visit | Attending: Cardiology | Admitting: Cardiology

## 2012-07-15 ENCOUNTER — Encounter (HOSPITAL_COMMUNITY)
Admission: RE | Admit: 2012-07-15 | Discharge: 2012-07-15 | Disposition: A | Discharge status: Home / Self Care | Payer: Self-pay | Source: Ambulatory Visit | Attending: Cardiology | Admitting: Cardiology

## 2012-07-16 ENCOUNTER — Encounter (HOSPITAL_COMMUNITY)
Admission: RE | Admit: 2012-07-16 | Discharge: 2012-07-16 | Disposition: A | Discharge status: Home / Self Care | Payer: Self-pay | Source: Ambulatory Visit | Attending: Cardiology | Admitting: Cardiology

## 2012-07-20 ENCOUNTER — Encounter (HOSPITAL_COMMUNITY)
Admission: RE | Admit: 2012-07-20 | Discharge: 2012-07-20 | Disposition: A | Discharge status: Home / Self Care | Payer: Self-pay | Source: Ambulatory Visit | Attending: Cardiology | Admitting: Cardiology

## 2012-07-22 ENCOUNTER — Encounter (HOSPITAL_COMMUNITY)
Admission: RE | Admit: 2012-07-22 | Discharge: 2012-07-22 | Disposition: A | Discharge status: Home / Self Care | Payer: Self-pay | Source: Ambulatory Visit | Attending: Cardiology | Admitting: Cardiology

## 2012-07-23 ENCOUNTER — Encounter (HOSPITAL_COMMUNITY)
Admission: RE | Admit: 2012-07-23 | Discharge: 2012-07-23 | Disposition: A | Discharge status: Home / Self Care | Payer: Self-pay | Source: Ambulatory Visit | Attending: Cardiology | Admitting: Cardiology

## 2012-07-27 ENCOUNTER — Encounter (HOSPITAL_COMMUNITY)
Admission: RE | Admit: 2012-07-27 | Discharge: 2012-07-27 | Disposition: A | Discharge status: Home / Self Care | Payer: Self-pay | Source: Ambulatory Visit | Attending: Cardiology | Admitting: Cardiology

## 2012-07-29 ENCOUNTER — Encounter (HOSPITAL_COMMUNITY)
Admission: RE | Admit: 2012-07-29 | Discharge: 2012-07-29 | Disposition: A | Discharge status: Home / Self Care | Payer: Self-pay | Source: Ambulatory Visit | Attending: Cardiology | Admitting: Cardiology

## 2012-07-29 DIAGNOSIS — E785 Hyperlipidemia, unspecified: Secondary | ICD-10-CM | POA: Insufficient documentation

## 2012-07-29 DIAGNOSIS — Z9861 Coronary angioplasty status: Secondary | ICD-10-CM | POA: Insufficient documentation

## 2012-07-29 DIAGNOSIS — I2582 Chronic total occlusion of coronary artery: Secondary | ICD-10-CM | POA: Insufficient documentation

## 2012-07-29 DIAGNOSIS — I209 Angina pectoris, unspecified: Secondary | ICD-10-CM | POA: Insufficient documentation

## 2012-07-29 DIAGNOSIS — K219 Gastro-esophageal reflux disease without esophagitis: Secondary | ICD-10-CM | POA: Insufficient documentation

## 2012-07-29 DIAGNOSIS — I10 Essential (primary) hypertension: Secondary | ICD-10-CM | POA: Insufficient documentation

## 2012-07-29 DIAGNOSIS — I2510 Atherosclerotic heart disease of native coronary artery without angina pectoris: Secondary | ICD-10-CM | POA: Insufficient documentation

## 2012-07-29 DIAGNOSIS — Z5189 Encounter for other specified aftercare: Secondary | ICD-10-CM | POA: Insufficient documentation

## 2012-07-30 ENCOUNTER — Encounter (HOSPITAL_COMMUNITY): Payer: Self-pay

## 2012-08-03 ENCOUNTER — Encounter (HOSPITAL_COMMUNITY)
Admission: RE | Admit: 2012-08-03 | Discharge: 2012-08-03 | Disposition: A | Discharge status: Home / Self Care | Payer: Self-pay | Source: Ambulatory Visit | Attending: Cardiology | Admitting: Cardiology

## 2012-08-05 ENCOUNTER — Encounter (HOSPITAL_COMMUNITY)
Admission: RE | Admit: 2012-08-05 | Discharge: 2012-08-05 | Disposition: A | Discharge status: Home / Self Care | Payer: Self-pay | Source: Ambulatory Visit | Attending: Cardiology | Admitting: Cardiology

## 2012-08-06 ENCOUNTER — Encounter (HOSPITAL_COMMUNITY)
Admission: RE | Admit: 2012-08-06 | Discharge: 2012-08-06 | Disposition: A | Discharge status: Home / Self Care | Payer: Self-pay | Source: Ambulatory Visit | Attending: Cardiology | Admitting: Cardiology

## 2012-08-10 ENCOUNTER — Encounter (HOSPITAL_COMMUNITY)
Admission: RE | Admit: 2012-08-10 | Discharge: 2012-08-10 | Disposition: A | Discharge status: Home / Self Care | Payer: Self-pay | Source: Ambulatory Visit | Attending: Cardiology | Admitting: Cardiology

## 2012-08-12 ENCOUNTER — Encounter (HOSPITAL_COMMUNITY)
Admission: RE | Admit: 2012-08-12 | Discharge: 2012-08-12 | Disposition: A | Discharge status: Home / Self Care | Payer: Self-pay | Source: Ambulatory Visit | Attending: Cardiology | Admitting: Cardiology

## 2012-08-13 ENCOUNTER — Encounter (HOSPITAL_COMMUNITY)
Admission: RE | Admit: 2012-08-13 | Discharge: 2012-08-13 | Disposition: A | Discharge status: Home / Self Care | Payer: Self-pay | Source: Ambulatory Visit | Attending: Cardiology | Admitting: Cardiology

## 2012-08-17 ENCOUNTER — Encounter (HOSPITAL_COMMUNITY)
Admission: RE | Admit: 2012-08-17 | Discharge: 2012-08-17 | Disposition: A | Discharge status: Home / Self Care | Payer: Self-pay | Source: Ambulatory Visit | Attending: Cardiology | Admitting: Cardiology

## 2012-08-19 ENCOUNTER — Encounter (HOSPITAL_COMMUNITY)
Admission: RE | Admit: 2012-08-19 | Discharge: 2012-08-19 | Disposition: A | Discharge status: Home / Self Care | Payer: Self-pay | Source: Ambulatory Visit | Attending: Cardiology | Admitting: Cardiology

## 2012-08-20 ENCOUNTER — Encounter (HOSPITAL_COMMUNITY)
Admission: RE | Admit: 2012-08-20 | Discharge: 2012-08-20 | Disposition: A | Discharge status: Home / Self Care | Payer: Self-pay | Source: Ambulatory Visit | Attending: Cardiology | Admitting: Cardiology

## 2012-08-24 ENCOUNTER — Encounter (HOSPITAL_COMMUNITY)
Admission: RE | Admit: 2012-08-24 | Discharge: 2012-08-24 | Disposition: A | Discharge status: Home / Self Care | Payer: Self-pay | Source: Ambulatory Visit | Attending: Cardiology | Admitting: Cardiology

## 2012-08-26 ENCOUNTER — Encounter (HOSPITAL_COMMUNITY): Payer: Self-pay

## 2012-08-27 ENCOUNTER — Encounter (HOSPITAL_COMMUNITY)
Admission: RE | Admit: 2012-08-27 | Discharge: 2012-08-27 | Disposition: A | Discharge status: Home / Self Care | Payer: Self-pay | Source: Ambulatory Visit | Attending: Cardiology | Admitting: Cardiology

## 2012-08-31 ENCOUNTER — Telehealth: Payer: Self-pay | Admitting: Internal Medicine

## 2012-08-31 ENCOUNTER — Encounter (HOSPITAL_COMMUNITY)
Admission: RE | Admit: 2012-08-31 | Discharge: 2012-08-31 | Disposition: A | Discharge status: Home / Self Care | Payer: Self-pay | Source: Ambulatory Visit | Attending: Cardiology | Admitting: Cardiology

## 2012-08-31 DIAGNOSIS — I209 Angina pectoris, unspecified: Secondary | ICD-10-CM | POA: Insufficient documentation

## 2012-08-31 DIAGNOSIS — F419 Anxiety disorder, unspecified: Secondary | ICD-10-CM

## 2012-08-31 DIAGNOSIS — E785 Hyperlipidemia, unspecified: Secondary | ICD-10-CM | POA: Insufficient documentation

## 2012-08-31 DIAGNOSIS — Z9861 Coronary angioplasty status: Secondary | ICD-10-CM | POA: Insufficient documentation

## 2012-08-31 DIAGNOSIS — I10 Essential (primary) hypertension: Secondary | ICD-10-CM | POA: Insufficient documentation

## 2012-08-31 DIAGNOSIS — Z5189 Encounter for other specified aftercare: Secondary | ICD-10-CM | POA: Insufficient documentation

## 2012-08-31 DIAGNOSIS — I2510 Atherosclerotic heart disease of native coronary artery without angina pectoris: Secondary | ICD-10-CM | POA: Insufficient documentation

## 2012-08-31 DIAGNOSIS — I2582 Chronic total occlusion of coronary artery: Secondary | ICD-10-CM | POA: Insufficient documentation

## 2012-08-31 DIAGNOSIS — K219 Gastro-esophageal reflux disease without esophagitis: Secondary | ICD-10-CM | POA: Insufficient documentation

## 2012-08-31 MED ORDER — ALPRAZOLAM 0.5 MG PO TABS: 0.5000 mg | ORAL_TABLET | Freq: Every evening | ORAL | Status: DC | PRN

## 2012-08-31 NOTE — Telephone Encounter (Signed)
Phoned in xanax med to pharmacy

## 2012-08-31 NOTE — Telephone Encounter (Signed)
Last filled (per computer) 11/2011.  Okay for refill of #30, no additional refills.  Please phone in and document.  Thanks

## 2012-09-02 ENCOUNTER — Encounter (HOSPITAL_COMMUNITY)
Admission: RE | Admit: 2012-09-02 | Discharge: 2012-09-02 | Disposition: A | Discharge status: Home / Self Care | Payer: Self-pay | Source: Ambulatory Visit | Attending: Cardiology | Admitting: Cardiology

## 2012-09-03 ENCOUNTER — Encounter (HOSPITAL_COMMUNITY)
Admission: RE | Admit: 2012-09-03 | Discharge: 2012-09-03 | Disposition: A | Discharge status: Home / Self Care | Payer: Self-pay | Source: Ambulatory Visit | Attending: Cardiology | Admitting: Cardiology

## 2012-09-07 ENCOUNTER — Encounter (HOSPITAL_COMMUNITY)
Admission: RE | Admit: 2012-09-07 | Discharge: 2012-09-07 | Disposition: A | Discharge status: Home / Self Care | Payer: Self-pay | Source: Ambulatory Visit | Attending: Cardiology | Admitting: Cardiology

## 2012-09-09 ENCOUNTER — Encounter (HOSPITAL_COMMUNITY)
Admission: RE | Admit: 2012-09-09 | Discharge: 2012-09-09 | Disposition: A | Discharge status: Home / Self Care | Payer: Self-pay | Source: Ambulatory Visit | Attending: Cardiology | Admitting: Cardiology

## 2012-09-10 ENCOUNTER — Encounter (HOSPITAL_COMMUNITY)
Admission: RE | Admit: 2012-09-10 | Discharge: 2012-09-10 | Disposition: A | Discharge status: Home / Self Care | Payer: Self-pay | Source: Ambulatory Visit | Attending: Cardiology | Admitting: Cardiology

## 2012-09-14 ENCOUNTER — Encounter (HOSPITAL_COMMUNITY): Payer: Self-pay

## 2012-09-16 ENCOUNTER — Encounter (HOSPITAL_COMMUNITY)
Admission: RE | Admit: 2012-09-16 | Discharge: 2012-09-16 | Disposition: A | Discharge status: Home / Self Care | Payer: Self-pay | Source: Ambulatory Visit | Attending: Cardiology | Admitting: Cardiology

## 2012-09-17 ENCOUNTER — Encounter (HOSPITAL_COMMUNITY): Payer: Self-pay

## 2012-09-21 ENCOUNTER — Encounter (HOSPITAL_COMMUNITY)
Admission: RE | Admit: 2012-09-21 | Discharge: 2012-09-21 | Disposition: A | Discharge status: Home / Self Care | Payer: Self-pay | Source: Ambulatory Visit | Attending: Cardiology | Admitting: Cardiology

## 2012-09-23 ENCOUNTER — Encounter (HOSPITAL_COMMUNITY)
Admission: RE | Admit: 2012-09-23 | Discharge: 2012-09-23 | Disposition: A | Discharge status: Home / Self Care | Payer: Self-pay | Source: Ambulatory Visit | Attending: Cardiology | Admitting: Cardiology

## 2012-09-24 ENCOUNTER — Encounter (HOSPITAL_COMMUNITY): Payer: Self-pay

## 2012-09-28 ENCOUNTER — Encounter (HOSPITAL_COMMUNITY)
Admission: RE | Admit: 2012-09-28 | Discharge: 2012-09-28 | Disposition: A | Discharge status: Home / Self Care | Payer: Self-pay | Source: Ambulatory Visit | Attending: Cardiology | Admitting: Cardiology

## 2012-09-28 DIAGNOSIS — E785 Hyperlipidemia, unspecified: Secondary | ICD-10-CM | POA: Insufficient documentation

## 2012-09-28 DIAGNOSIS — Z5189 Encounter for other specified aftercare: Secondary | ICD-10-CM | POA: Insufficient documentation

## 2012-09-28 DIAGNOSIS — Z9861 Coronary angioplasty status: Secondary | ICD-10-CM | POA: Insufficient documentation

## 2012-09-28 DIAGNOSIS — I2510 Atherosclerotic heart disease of native coronary artery without angina pectoris: Secondary | ICD-10-CM | POA: Insufficient documentation

## 2012-09-28 DIAGNOSIS — I209 Angina pectoris, unspecified: Secondary | ICD-10-CM | POA: Insufficient documentation

## 2012-09-28 DIAGNOSIS — I2582 Chronic total occlusion of coronary artery: Secondary | ICD-10-CM | POA: Insufficient documentation

## 2012-09-28 DIAGNOSIS — K219 Gastro-esophageal reflux disease without esophagitis: Secondary | ICD-10-CM | POA: Insufficient documentation

## 2012-09-28 DIAGNOSIS — I10 Essential (primary) hypertension: Secondary | ICD-10-CM | POA: Insufficient documentation

## 2012-09-30 ENCOUNTER — Encounter (HOSPITAL_COMMUNITY): Payer: Self-pay

## 2012-10-01 ENCOUNTER — Encounter (HOSPITAL_COMMUNITY)
Admission: RE | Admit: 2012-10-01 | Discharge: 2012-10-01 | Disposition: A | Discharge status: Home / Self Care | Payer: Self-pay | Source: Ambulatory Visit | Attending: Cardiology | Admitting: Cardiology

## 2012-10-05 ENCOUNTER — Encounter (HOSPITAL_COMMUNITY)
Admission: RE | Admit: 2012-10-05 | Discharge: 2012-10-05 | Disposition: A | Discharge status: Home / Self Care | Payer: Self-pay | Source: Ambulatory Visit | Attending: Cardiology | Admitting: Cardiology

## 2012-10-07 ENCOUNTER — Encounter (HOSPITAL_COMMUNITY)
Admission: RE | Admit: 2012-10-07 | Discharge: 2012-10-07 | Disposition: A | Discharge status: Home / Self Care | Payer: Self-pay | Source: Ambulatory Visit | Attending: Cardiology | Admitting: Cardiology

## 2012-10-08 ENCOUNTER — Encounter (HOSPITAL_COMMUNITY)
Admission: RE | Admit: 2012-10-08 | Discharge: 2012-10-08 | Disposition: A | Discharge status: Home / Self Care | Payer: Self-pay | Source: Ambulatory Visit | Attending: Cardiology | Admitting: Cardiology

## 2012-10-12 ENCOUNTER — Encounter (HOSPITAL_COMMUNITY)
Admission: RE | Admit: 2012-10-12 | Discharge: 2012-10-12 | Disposition: A | Discharge status: Home / Self Care | Payer: Self-pay | Source: Ambulatory Visit | Attending: Cardiology | Admitting: Cardiology

## 2012-10-14 ENCOUNTER — Encounter (HOSPITAL_COMMUNITY)
Admission: RE | Admit: 2012-10-14 | Discharge: 2012-10-14 | Disposition: A | Discharge status: Home / Self Care | Payer: Self-pay | Source: Ambulatory Visit | Attending: Cardiology | Admitting: Cardiology

## 2012-10-15 ENCOUNTER — Encounter (HOSPITAL_COMMUNITY)
Admission: RE | Admit: 2012-10-15 | Discharge: 2012-10-15 | Disposition: A | Discharge status: Home / Self Care | Payer: Self-pay | Source: Ambulatory Visit | Attending: Cardiology | Admitting: Cardiology

## 2012-10-19 ENCOUNTER — Encounter (HOSPITAL_COMMUNITY)
Admission: RE | Admit: 2012-10-19 | Discharge: 2012-10-19 | Disposition: A | Discharge status: Home / Self Care | Payer: Self-pay | Source: Ambulatory Visit | Attending: Cardiology | Admitting: Cardiology

## 2012-10-21 ENCOUNTER — Encounter (HOSPITAL_COMMUNITY)
Admission: RE | Admit: 2012-10-21 | Discharge: 2012-10-21 | Disposition: A | Discharge status: Home / Self Care | Payer: Self-pay | Source: Ambulatory Visit | Attending: Cardiology | Admitting: Cardiology

## 2012-10-22 ENCOUNTER — Encounter (HOSPITAL_COMMUNITY)
Admission: RE | Admit: 2012-10-22 | Discharge: 2012-10-22 | Disposition: A | Discharge status: Home / Self Care | Payer: Self-pay | Source: Ambulatory Visit | Attending: Cardiology | Admitting: Cardiology

## 2012-10-26 ENCOUNTER — Encounter (HOSPITAL_COMMUNITY): Payer: Self-pay

## 2012-10-28 ENCOUNTER — Encounter (HOSPITAL_COMMUNITY)
Admission: RE | Admit: 2012-10-28 | Discharge: 2012-10-28 | Disposition: A | Discharge status: Home / Self Care | Payer: Self-pay | Source: Ambulatory Visit | Attending: Cardiology | Admitting: Cardiology

## 2012-10-29 ENCOUNTER — Encounter (HOSPITAL_COMMUNITY)
Admission: RE | Admit: 2012-10-29 | Discharge: 2012-10-29 | Disposition: A | Discharge status: Home / Self Care | Payer: Self-pay | Source: Ambulatory Visit | Attending: Cardiology | Admitting: Cardiology

## 2012-10-29 DIAGNOSIS — I2582 Chronic total occlusion of coronary artery: Secondary | ICD-10-CM | POA: Insufficient documentation

## 2012-10-29 DIAGNOSIS — Z9861 Coronary angioplasty status: Secondary | ICD-10-CM | POA: Insufficient documentation

## 2012-10-29 DIAGNOSIS — I209 Angina pectoris, unspecified: Secondary | ICD-10-CM | POA: Insufficient documentation

## 2012-10-29 DIAGNOSIS — K219 Gastro-esophageal reflux disease without esophagitis: Secondary | ICD-10-CM | POA: Insufficient documentation

## 2012-10-29 DIAGNOSIS — Z5189 Encounter for other specified aftercare: Secondary | ICD-10-CM | POA: Insufficient documentation

## 2012-10-29 DIAGNOSIS — E785 Hyperlipidemia, unspecified: Secondary | ICD-10-CM | POA: Insufficient documentation

## 2012-10-29 DIAGNOSIS — I2510 Atherosclerotic heart disease of native coronary artery without angina pectoris: Secondary | ICD-10-CM | POA: Insufficient documentation

## 2012-10-29 DIAGNOSIS — I10 Essential (primary) hypertension: Secondary | ICD-10-CM | POA: Insufficient documentation

## 2012-11-02 ENCOUNTER — Encounter (HOSPITAL_COMMUNITY)
Admission: RE | Admit: 2012-11-02 | Discharge: 2012-11-02 | Disposition: A | Discharge status: Home / Self Care | Payer: Self-pay | Source: Ambulatory Visit | Attending: Cardiology | Admitting: Cardiology

## 2012-11-03 ENCOUNTER — Telehealth: Payer: Self-pay | Admitting: Internal Medicine

## 2012-11-03 DIAGNOSIS — F411 Generalized anxiety disorder: Secondary | ICD-10-CM

## 2012-11-03 DIAGNOSIS — E7800 Pure hypercholesterolemia, unspecified: Secondary | ICD-10-CM

## 2012-11-03 MED ORDER — CITALOPRAM HYDROBROMIDE 40 MG PO TABS: 40.0000 mg | ORAL_TABLET | Freq: Every day | ORAL | Status: DC

## 2012-11-03 MED ORDER — ATORVASTATIN CALCIUM 20 MG PO TABS: 20.0000 mg | ORAL_TABLET | Freq: Every day | ORAL | Status: DC

## 2012-11-03 NOTE — Telephone Encounter (Signed)
I recommend CBC, c-met, FLP and TSH, dx v58.69.  I also recommend PSA (v76.44) if not done by his urologist Dr. Vernie Ammons (it looks like we checked it last year; he has h/o prostate cancer and should be f/b urology also--verify he didn't have PSA done through them)

## 2012-11-03 NOTE — Telephone Encounter (Signed)
Last seen for a CPE a year ago.  No visit scheduled.  Needs to schedule CPE (vs med check).  Last labs 10/2011, and is requesting Lipitor refill.

## 2012-11-03 NOTE — Telephone Encounter (Signed)
Spoke with patient and he is going to do his med check 12/02/12 @ 3:45pm, he would like to come in for fasting labs 11/30/12, is there anything in addition to the lipids you would like drawn so I can place future orders, thanks. Med refills sent x 90 days.

## 2012-11-04 ENCOUNTER — Telehealth: Payer: Self-pay | Admitting: Family Medicine

## 2012-11-04 ENCOUNTER — Other Ambulatory Visit: Payer: Self-pay | Admitting: *Deleted

## 2012-11-04 ENCOUNTER — Encounter (HOSPITAL_COMMUNITY)
Admission: RE | Admit: 2012-11-04 | Discharge: 2012-11-04 | Disposition: A | Discharge status: Home / Self Care | Payer: Self-pay | Source: Ambulatory Visit | Attending: Cardiology | Admitting: Cardiology

## 2012-11-04 DIAGNOSIS — E7800 Pure hypercholesterolemia, unspecified: Secondary | ICD-10-CM

## 2012-11-04 DIAGNOSIS — Z79899 Other long term (current) drug therapy: Secondary | ICD-10-CM

## 2012-11-04 DIAGNOSIS — F411 Generalized anxiety disorder: Secondary | ICD-10-CM

## 2012-11-04 MED ORDER — ATORVASTATIN CALCIUM 20 MG PO TABS: 20.0000 mg | ORAL_TABLET | Freq: Every day | ORAL | Status: DC

## 2012-11-04 MED ORDER — CITALOPRAM HYDROBROMIDE 40 MG PO TABS: 40.0000 mg | ORAL_TABLET | Freq: Every day | ORAL | Status: DC

## 2012-11-04 NOTE — Telephone Encounter (Signed)
My mistake, recalled into correct pharmacy and also called patient to let him know.

## 2012-11-05 ENCOUNTER — Encounter (HOSPITAL_COMMUNITY)
Admission: RE | Admit: 2012-11-05 | Discharge: 2012-11-05 | Disposition: A | Discharge status: Home / Self Care | Payer: Self-pay | Source: Ambulatory Visit | Attending: Cardiology | Admitting: Cardiology

## 2012-11-09 ENCOUNTER — Encounter (HOSPITAL_COMMUNITY)
Admission: RE | Admit: 2012-11-09 | Discharge: 2012-11-09 | Disposition: A | Discharge status: Home / Self Care | Payer: Self-pay | Source: Ambulatory Visit | Attending: Cardiology | Admitting: Cardiology

## 2012-11-11 ENCOUNTER — Encounter (HOSPITAL_COMMUNITY)
Admission: RE | Admit: 2012-11-11 | Discharge: 2012-11-11 | Disposition: A | Discharge status: Home / Self Care | Payer: Self-pay | Source: Ambulatory Visit | Attending: Cardiology | Admitting: Cardiology

## 2012-11-12 ENCOUNTER — Encounter (HOSPITAL_COMMUNITY): Payer: Self-pay

## 2012-11-16 ENCOUNTER — Encounter (HOSPITAL_COMMUNITY)
Admission: RE | Admit: 2012-11-16 | Discharge: 2012-11-16 | Disposition: A | Discharge status: Home / Self Care | Payer: Self-pay | Source: Ambulatory Visit | Attending: Cardiology | Admitting: Cardiology

## 2012-11-17 ENCOUNTER — Telehealth: Payer: Self-pay | Admitting: *Deleted

## 2012-11-17 NOTE — Telephone Encounter (Signed)
Just an FYI-I called over to [facility] and patient did have a PSA done yesterday-it was 0.13.

## 2012-11-18 ENCOUNTER — Encounter (HOSPITAL_COMMUNITY)
Admission: RE | Admit: 2012-11-18 | Discharge: 2012-11-18 | Disposition: A | Discharge status: Home / Self Care | Payer: Self-pay | Source: Ambulatory Visit | Attending: Cardiology | Admitting: Cardiology

## 2012-11-19 ENCOUNTER — Encounter (HOSPITAL_COMMUNITY)
Admission: RE | Admit: 2012-11-19 | Discharge: 2012-11-19 | Disposition: A | Discharge status: Home / Self Care | Payer: Self-pay | Source: Ambulatory Visit | Attending: Cardiology | Admitting: Cardiology

## 2012-11-19 ENCOUNTER — Encounter: Payer: Self-pay | Admitting: Internal Medicine

## 2012-11-23 ENCOUNTER — Encounter (HOSPITAL_COMMUNITY)
Admission: RE | Admit: 2012-11-23 | Discharge: 2012-11-23 | Disposition: A | Discharge status: Home / Self Care | Payer: Self-pay | Source: Ambulatory Visit | Attending: Cardiology | Admitting: Cardiology

## 2012-11-26 ENCOUNTER — Encounter: Payer: Self-pay | Admitting: Family Medicine

## 2012-11-30 ENCOUNTER — Other Ambulatory Visit: Payer: BC Managed Care – PPO

## 2012-11-30 ENCOUNTER — Encounter (HOSPITAL_COMMUNITY)
Admission: RE | Admit: 2012-11-30 | Discharge: 2012-11-30 | Disposition: A | Discharge status: Home / Self Care | Payer: Self-pay | Source: Ambulatory Visit | Attending: Cardiology | Admitting: Cardiology

## 2012-11-30 DIAGNOSIS — Z79899 Other long term (current) drug therapy: Secondary | ICD-10-CM

## 2012-11-30 DIAGNOSIS — K219 Gastro-esophageal reflux disease without esophagitis: Secondary | ICD-10-CM | POA: Insufficient documentation

## 2012-11-30 DIAGNOSIS — Z5189 Encounter for other specified aftercare: Secondary | ICD-10-CM | POA: Insufficient documentation

## 2012-11-30 DIAGNOSIS — Z9861 Coronary angioplasty status: Secondary | ICD-10-CM | POA: Insufficient documentation

## 2012-11-30 DIAGNOSIS — I209 Angina pectoris, unspecified: Secondary | ICD-10-CM | POA: Insufficient documentation

## 2012-11-30 DIAGNOSIS — I10 Essential (primary) hypertension: Secondary | ICD-10-CM | POA: Insufficient documentation

## 2012-11-30 DIAGNOSIS — I2582 Chronic total occlusion of coronary artery: Secondary | ICD-10-CM | POA: Insufficient documentation

## 2012-11-30 DIAGNOSIS — E785 Hyperlipidemia, unspecified: Secondary | ICD-10-CM | POA: Insufficient documentation

## 2012-11-30 DIAGNOSIS — I2510 Atherosclerotic heart disease of native coronary artery without angina pectoris: Secondary | ICD-10-CM | POA: Insufficient documentation

## 2012-11-30 LAB — CBC WITH DIFFERENTIAL/PLATELET
Basophils Absolute: 0 10*3/uL (ref 0.0–0.1)
Eosinophils Absolute: 0.1 10*3/uL (ref 0.0–0.7)
Lymphs Abs: 1.8 10*3/uL (ref 0.7–4.0)
MCH: 32.9 pg (ref 26.0–34.0)
Neutrophils Relative %: 54 % (ref 43–77)
Platelets: 188 10*3/uL (ref 150–400)
RBC: 4.68 MIL/uL (ref 4.22–5.81)
RDW: 13.8 % (ref 11.5–15.5)
WBC: 5.2 10*3/uL (ref 4.0–10.5)

## 2012-11-30 LAB — LIPID PANEL
HDL: 47 mg/dL (ref >39)
Total CHOL/HDL Ratio: 3.6 Ratio
VLDL: 27 mg/dL (ref 0–40)

## 2012-11-30 LAB — COMPREHENSIVE METABOLIC PANEL
ALT: 31 U/L (ref 0–53)
AST: 30 U/L (ref 0–37)
BUN: 16 mg/dL (ref 6–23)
Creat: 0.92 mg/dL (ref 0.50–1.35)
Total Bilirubin: 0.7 mg/dL (ref 0.3–1.2)

## 2012-11-30 LAB — TSH: TSH: 2.608 u[IU]/mL (ref 0.350–4.500)

## 2012-12-02 ENCOUNTER — Ambulatory Visit (INDEPENDENT_AMBULATORY_CARE_PROVIDER_SITE_OTHER): Payer: BC Managed Care – PPO | Admitting: Family Medicine

## 2012-12-02 ENCOUNTER — Encounter (HOSPITAL_COMMUNITY): Payer: Self-pay

## 2012-12-02 ENCOUNTER — Encounter: Payer: Self-pay | Admitting: Family Medicine

## 2012-12-02 VITALS — BP 120/70 | HR 56 | Ht 70.0 in | Wt 200.0 lb

## 2012-12-02 DIAGNOSIS — K219 Gastro-esophageal reflux disease without esophagitis: Secondary | ICD-10-CM

## 2012-12-02 DIAGNOSIS — E7800 Pure hypercholesterolemia, unspecified: Secondary | ICD-10-CM

## 2012-12-02 DIAGNOSIS — I2510 Atherosclerotic heart disease of native coronary artery without angina pectoris: Secondary | ICD-10-CM

## 2012-12-02 DIAGNOSIS — F411 Generalized anxiety disorder: Secondary | ICD-10-CM

## 2012-12-02 DIAGNOSIS — I10 Essential (primary) hypertension: Secondary | ICD-10-CM

## 2012-12-02 MED ORDER — ATORVASTATIN CALCIUM 20 MG PO TABS: 20.0000 mg | ORAL_TABLET | Freq: Every day | ORAL | Status: DC

## 2012-12-02 NOTE — Patient Instructions (Addendum)
Check with your insurance regarding shingles vaccine (zostavax). If covered, schedule nurse visit at your convenience.   Lab Results  Component Value Date   WBC 5.2 11/30/2012   HGB 15.4 11/30/2012   HCT 43.8 11/30/2012   MCV 93.6 11/30/2012   PLT 188 11/30/2012     Chemistry      Component Value Date/Time   NA 140 11/30/2012 0817   K 4.5 11/30/2012 0817   CL 106 11/30/2012 0817   CO2 28 11/30/2012 0817   BUN 16 11/30/2012 0817   CREATININE 0.92 11/30/2012 0817   CREATININE 1.1 04/21/2010 2126      Component Value Date/Time   CALCIUM 9.5 11/30/2012 0817   ALKPHOS 46 11/30/2012 0817   AST 30 11/30/2012 0817   ALT 31 11/30/2012 0817   BILITOT 0.7 11/30/2012 0817     Fasting sugar 82 (normal)  Lab Results  Component Value Date   CHOL 171 11/30/2012   CHOL 156 11/03/2011   CHOL 131 05/15/2011   Lab Results  Component Value Date   HDL 47 11/30/2012   HDL 53 16/12/958   HDL 49 4/54/0981   Lab Results  Component Value Date   LDLCALC 97 11/30/2012   LDLCALC 82 11/03/2011   LDLCALC 63 05/15/2011   Lab Results  Component Value Date   TRIG 137 11/30/2012   TRIG 105 11/03/2011   TRIG 93 05/15/2011   Lab Results  Component Value Date   CHOLHDL 3.6 11/30/2012   CHOLHDL 2.9 11/03/2011   CHOLHDL 2.7 05/15/2011   No results found for this basename: LDLDIRECT   Lab Results  Component Value Date   TSH 2.608 11/30/2012   Cholesterol is higher than previously, and HDL is lower.  Ideally LDL should be <70, and now it is closer to 100.  Try and work harder on cutting back on cholesterol in diet.  Try and exercise at least 30 minutes daily, and get some of the weight off that you have gained.   Fat and Cholesterol Control Diet Cholesterol levels in your body are determined significantly by your diet. Cholesterol levels may also be related to heart disease. The following material helps to explain this relationship and discusses what you can do to help keep your heart healthy. Not all cholesterol is  bad. Low-density lipoprotein (LDL) cholesterol is the "bad" cholesterol. It may cause fatty deposits to build up inside your arteries. High-density lipoprotein (HDL) cholesterol is "good." It helps to remove the "bad" LDL cholesterol from your blood. Cholesterol is a very important risk factor for heart disease. Other risk factors are high blood pressure, smoking, stress, heredity, and weight. The heart muscle gets its supply of blood through the coronary arteries. If your LDL cholesterol is high and your HDL cholesterol is low, you are at risk for having fatty deposits build up in your coronary arteries. This leaves less room through which blood can flow. Without sufficient blood and oxygen, the heart muscle cannot function properly and you may feel chest pains (angina pectoris). When a coronary artery closes up entirely, a part of the heart muscle may die causing a heart attack (myocardial infarction). CHECKING CHOLESTEROL When your caregiver sends your blood to a lab to be examined for cholesterol, a complete lipid (fat) profile may be done. With this test, the total amount of cholesterol and levels of LDL and HDL are determined. Triglycerides are a type of fat that circulates in the blood. They can also be used to determine heart disease  risk. The list below describes what the numbers should be: Test: Total Cholesterol.  Less than 200 mg/dl. Test: LDL "bad cholesterol."  Less than 100 mg/dl.  Less than 70 mg/dl if you are at very high risk of a heart attack or sudden cardiac death. Test: HDL "good cholesterol."  Greater than 50 mg/dl for women.  Greater than 40 mg/dl for men. Test: Triglycerides.  Less than 150 mg/dl. CONTROLLING CHOLESTEROL WITH DIET Although exercise and lifestyle factors are important, your diet is key. That is because certain foods are known to raise cholesterol and others to lower it. The goal is to balance foods for their effect on cholesterol and more importantly, to  replace saturated and trans fat with other types of fat, such as monounsaturated fat, polyunsaturated fat, and omega-3 fatty acids. On average, a person should consume no more than 15 to 17 g of saturated fat daily. Saturated and trans fats are considered "bad" fats, and they will raise LDL cholesterol. Saturated fats are primarily found in animal products such as meats, butter, and cream. However, that does not mean you need to give up all your favorite foods. Today, there are good tasting, low-fat, low-cholesterol substitutes for most of the things you like to eat. Choose low-fat or nonfat alternatives. Choose round or loin cuts of red meat. These types of cuts are lowest in fat and cholesterol. Chicken (without the skin), fish, veal, and ground [place] breast are great choices. Eliminate fatty meats, such as hot dogs and salami. Even shellfish have little or no saturated fat. Have a 3 oz (85 g) portion when you eat lean meat, poultry, or fish. Trans fats are also called "partially hydrogenated oils." They are oils that have been scientifically manipulated so that they are solid at room temperature resulting in a longer shelf life and improved taste and texture of foods in which they are added. Trans fats are found in stick margarine, some tub margarines, cookies, crackers, and baked goods.  When baking and cooking, oils are a great substitute for butter. The monounsaturated oils are especially beneficial since it is believed they lower LDL and raise HDL. The oils you should avoid entirely are saturated tropical oils, such as coconut and palm.  Remember to eat a lot from food groups that are naturally free of saturated and trans fat, including fish, fruit, vegetables, beans, grains (barley, rice, couscous, bulgur wheat), and pasta (without cream sauces).  IDENTIFYING FOODS THAT LOWER CHOLESTEROL  Soluble fiber may lower your cholesterol. This type of fiber is found in fruits such as apples, vegetables such as  broccoli, potatoes, and carrots, legumes such as beans, peas, and lentils, and grains such as barley. Foods fortified with plant sterols (phytosterol) may also lower cholesterol. You should eat at least 2 g per day of these foods for a cholesterol lowering effect.  Read package labels to identify low-saturated fats, trans fat free, and low-fat foods at the supermarket. Select cheeses that have only 2 to 3 g saturated fat per ounce. Use a heart-healthy tub margarine that is free of trans fats or partially hydrogenated oil. When buying baked goods (cookies, crackers), avoid partially hydrogenated oils. Breads and muffins should be made from whole grains (whole-wheat or whole oat flour, instead of "flour" or "enriched flour"). Buy non-creamy canned soups with reduced salt and no added fats.  FOOD PREPARATION TECHNIQUES  Never deep-fry. If you must fry, either stir-fry, which uses very little fat, or use non-stick cooking sprays. When possible, broil, bake,  or roast meats, and steam vegetables. Instead of putting butter or margarine on vegetables, use lemon and herbs, applesauce, and cinnamon (for squash and sweet potatoes), nonfat yogurt, salsa, and low-fat dressings for salads.  LOW-SATURATED FAT / LOW-FAT FOOD SUBSTITUTES Meats / Saturated Fat (g)  Avoid: Steak, marbled (3 oz/85 g) / 11 g  Choose: Steak, lean (3 oz/85 g) / 4 g  Avoid: Hamburger (3 oz/85 g) / 7 g  Choose: Hamburger, lean (3 oz/85 g) / 5 g  Avoid: Ham (3 oz/85 g) / 6 g  Choose: Ham, lean cut (3 oz/85 g) / 2.4 g  Avoid: Chicken, with skin, dark meat (3 oz/85 g) / 4 g  Choose: Chicken, skin removed, dark meat (3 oz/85 g) / 2 g  Avoid: Chicken, with skin, light meat (3 oz/85 g) / 2.5 g  Choose: Chicken, skin removed, light meat (3 oz/85 g) / 1 g Dairy / Saturated Fat (g)  Avoid: Whole milk (1 cup) / 5 g  Choose: Low-fat milk, 2% (1 cup) / 3 g  Choose: Low-fat milk, 1% (1 cup) / 1.5 g  Choose: Skim milk (1 cup) / 0.3  g  Avoid: Hard cheese (1 oz/28 g) / 6 g  Choose: Skim milk cheese (1 oz/28 g) / 2 to 3 g  Avoid: Cottage cheese, 4% fat (1 cup) / 6.5 g  Choose: Low-fat cottage cheese, 1% fat (1 cup) / 1.5 g  Avoid: Ice cream (1 cup) / 9 g  Choose: Sherbet (1 cup) / 2.5 g  Choose: Nonfat frozen yogurt (1 cup) / 0.3 g  Choose: Frozen fruit bar / trace  Avoid: Whipped cream (1 tbs) / 3.5 g  Choose: Nondairy whipped topping (1 tbs) / 1 g Condiments / Saturated Fat (g)  Avoid: Mayonnaise (1 tbs) / 2 g  Choose: Low-fat mayonnaise (1 tbs) / 1 g  Avoid: Butter (1 tbs) / 7 g  Choose: Extra light margarine (1 tbs) / 1 g  Avoid: Coconut oil (1 tbs) / 11.8 g  Choose: Olive oil (1 tbs) / 1.8 g  Choose: Corn oil (1 tbs) / 1.7 g  Choose: Safflower oil (1 tbs) / 1.2 g  Choose: Sunflower oil (1 tbs) / 1.4 g  Choose: Soybean oil (1 tbs) / 2.4 g  Choose: Canola oil (1 tbs) / 1 g Document Released: 12/15/2005 Document Revised: 03/08/2012 Document Reviewed: 06/05/2011 [facility] Patient Information 2013 [place], [place].

## 2012-12-02 NOTE — Progress Notes (Signed)
Chief Complaint  Patient presents with  . Med check    labs done 11/30/12.   HPI: Patient presents for med check.  His only complaint is that he feels "whipped"/fatigued by the end of the week, ready to go to bed by 9.  Sleeping well, just up once a night to void, if at all.  Moods have been good, better than it had been--has help at work, so less stressful.  Has gained weight.  Still going to cardiac rehab, still kayaking, but not as often.  Eating more, and diet hasn't been as good, eating more sweets.  CAD:  Denies chest pain, DOE--stable for a couple of years. HTN:  BP's at home are running 120/70 at home.  Denies headaches or dizziness GERD:  Takes Nexium every other day, if he uses less often has increased symptoms.  Denies dysphagia Hyperlipidemia follow-up: Compliant with medications and denies medication side effects. Thinks his diet hasn't been as good. Prostate cancer:  Recent check and all is good  Past Medical History  Diagnosis Date  . Hyperlipidemia   . Anxiety 05/2010  . Prostate cancer 01/2008     s/p brachiotherapy; Dr. Vernie Ammons  . CAD (coronary artery disease) 2010    stent to LAD 06/2009; Dr. Anne Fu  . Kidney stone 03/2010  . ED (erectile dysfunction)   . Irregular heartbeat     started on beta blocker by Dr. Anne Fu, improved  . Shingles 2010   Past Surgical History  Procedure Date  . Heart stent 06/2009    drug-eluting stent LAD  . Inguinal hernia repair 10/1998    left  . Colonoscopy 2005  . Vasectomy   . Radioactive seed implant 01/2008    prostate cancer  . Colonoscopy 05   History   Social History  . Marital Status: Married    Spouse Name: N/A    Number of Children: 3  . Years of Education: N/A   Occupational History  . manufacturing    Social History Main Topics  . Smoking status: Never Smoker   . Smokeless tobacco: Never Used  . Alcohol Use: Yes     Comment: 5 beers per week.  . Drug Use: No  . Sexually Active: Not on file   Other Topics  Concern  . Not on file   Social History Narrative   Married. Currently daughter and 3 grandkids are living with them (going through separation).  Another daughter in [place], and one in [place].   Current Outpatient Prescriptions on File Prior to Visit  Medication Sig Dispense Refill  . ALPRAZolam (XANAX) 0.5 MG tablet Take 1 tablet (0.5 mg total) by mouth at bedtime as needed.  30 tablet  0  . aspirin 81 MG tablet Take 81 mg by mouth daily.        Marland Kitchen atorvastatin (LIPITOR) 20 MG tablet Take 1 tablet (20 mg total) by mouth daily.  90 tablet  0  . citalopram (CELEXA) 40 MG tablet Take 1 tablet (40 mg total) by mouth daily.  90 tablet  0  . fish oil-omega-3 fatty acids 1000 MG capsule Take 1 g by mouth daily.        . metoprolol succinate (TOPROL-XL) 25 MG 24 hr tablet Take 1 tablet by mouth Daily.      Marland Kitchen NEXIUM 40 MG capsule TAKE 1 CAPSULE EVERY DAY  34 capsule  5  . vardenafil (LEVITRA) 20 MG tablet Take 20 mg by mouth daily as needed.  Allergies  Allergen Reactions  . Imdur (Isosorbide Mononitrate) Rash   ROS:  Denies fevers, URI symptoms, cough, shortness of breath, chest pain, palpitations, nausea, vomiting, bowel changes, polydipsia, polyuria, dysuria, skin rashes/lesions, bleeding/bruising, anxiety or other concerns.  Moods good. +weight gain and fatigue as per HPI.  PHYSICAL EXAM: BP 120/70  Pulse 56  Ht 5\' 10"  (1.778 m)  Wt 200 lb (90.719 kg)  BMI 28.70 kg/m2 Well developed pleasant male in no distress HEENT:  Conjunctiva clear, PERRL, OP clear Neck: no lymphadenopathy, thyromegaly or carotid bruit Heart: regular rate and rhythm, no murmur, rub Lungs: clear bilaterally Back: no CVA or spinal tenderness Abdomen: soft, nontender, no organomegaly or mass Extremities: no edema, 2+ pulse Skin: no rash Psych: normal mood, affect, hygiene and grooming Neuro: alert and oriented x 3.  Cranial nerves intact, normal gait, normal strength, sensation   Lab Results  Component Value  Date   WBC 5.2 11/30/2012   HGB 15.4 11/30/2012   HCT 43.8 11/30/2012   MCV 93.6 11/30/2012   PLT 188 11/30/2012     Chemistry      Component Value Date/Time   NA 140 11/30/2012 0817   K 4.5 11/30/2012 0817   CL 106 11/30/2012 0817   CO2 28 11/30/2012 0817   BUN 16 11/30/2012 0817   CREATININE 0.92 11/30/2012 0817   CREATININE 1.1 04/21/2010 2126      Component Value Date/Time   CALCIUM 9.5 11/30/2012 0817   ALKPHOS 46 11/30/2012 0817   AST 30 11/30/2012 0817   ALT 31 11/30/2012 0817   BILITOT 0.7 11/30/2012 0817     Fasting sugar 82 (normal)  Lab Results  Component Value Date   CHOL 171 11/30/2012   CHOL 156 11/03/2011   CHOL 131 05/15/2011   Lab Results  Component Value Date   HDL 47 11/30/2012   HDL 53 40/08/8118   HDL 49 1/47/8295   Lab Results  Component Value Date   LDLCALC 97 11/30/2012   LDLCALC 82 11/03/2011   LDLCALC 63 05/15/2011   Lab Results  Component Value Date   TRIG 137 11/30/2012   TRIG 105 11/03/2011   TRIG 93 05/15/2011   Lab Results  Component Value Date   CHOLHDL 3.6 11/30/2012   CHOLHDL 2.9 11/03/2011   CHOLHDL 2.7 05/15/2011   No results found for this basename: LDLDIRECT   Lab Results  Component Value Date   TSH 2.608 11/30/2012   ASSESSMENT/PLAN: 1. Pure hypercholesterolemia  atorvastatin (LIPITOR) 20 MG tablet, Lipid panel  2. HTN (hypertension)    3. CAD (coronary artery disease)    4. Anxiety state, unspecified    5. GERD (gastroesophageal reflux disease)     Send copies of results to Dr. Anne Fu.   HTN--controlled. CAD--stable Hyperlipidemia--worse compared to last year, on same meds, likely related to change in diet (which also caused weight gain). Improve diet/exercise. Keep meds the same.  Return in April (prior to appt with Dr. Anne Fu) for repeat lipids--we can send results to Dr. Anne Fu for his opinion, if not any better.  Fatigue--labs normal. Consider low T, but not checking it now, as I wouldn't treat given his h/o prostate cancer.   Consider checking (or he can discuss with urologist at next appt) if fatigue persists/worsens.  Counseled regarding risks/benefits of shingles vaccine and recommended he check insurance coverage, return for nurse visit if interested.

## 2012-12-03 ENCOUNTER — Encounter (HOSPITAL_COMMUNITY)
Admission: RE | Admit: 2012-12-03 | Discharge: 2012-12-03 | Disposition: A | Discharge status: Home / Self Care | Payer: Self-pay | Source: Ambulatory Visit | Attending: Cardiology | Admitting: Cardiology

## 2012-12-07 ENCOUNTER — Encounter (HOSPITAL_COMMUNITY)
Admission: RE | Admit: 2012-12-07 | Discharge: 2012-12-07 | Disposition: A | Discharge status: Home / Self Care | Payer: Self-pay | Source: Ambulatory Visit | Attending: Cardiology | Admitting: Cardiology

## 2012-12-09 ENCOUNTER — Encounter (HOSPITAL_COMMUNITY)
Admission: RE | Admit: 2012-12-09 | Discharge: 2012-12-09 | Disposition: A | Discharge status: Home / Self Care | Payer: Self-pay | Source: Ambulatory Visit | Attending: Cardiology | Admitting: Cardiology

## 2012-12-10 ENCOUNTER — Encounter (HOSPITAL_COMMUNITY)
Admission: RE | Admit: 2012-12-10 | Discharge: 2012-12-10 | Disposition: A | Discharge status: Home / Self Care | Payer: Self-pay | Source: Ambulatory Visit | Attending: Cardiology | Admitting: Cardiology

## 2012-12-14 ENCOUNTER — Encounter (HOSPITAL_COMMUNITY)
Admission: RE | Admit: 2012-12-14 | Discharge: 2012-12-14 | Disposition: A | Discharge status: Home / Self Care | Payer: Self-pay | Source: Ambulatory Visit | Attending: Cardiology | Admitting: Cardiology

## 2012-12-16 ENCOUNTER — Encounter (HOSPITAL_COMMUNITY)
Admission: RE | Admit: 2012-12-16 | Discharge: 2012-12-16 | Disposition: A | Discharge status: Home / Self Care | Payer: Self-pay | Source: Ambulatory Visit | Attending: Cardiology | Admitting: Cardiology

## 2012-12-17 ENCOUNTER — Encounter (HOSPITAL_COMMUNITY)
Admission: RE | Admit: 2012-12-17 | Discharge: 2012-12-17 | Disposition: A | Discharge status: Home / Self Care | Payer: Self-pay | Source: Ambulatory Visit | Attending: Cardiology | Admitting: Cardiology

## 2012-12-21 ENCOUNTER — Encounter (HOSPITAL_COMMUNITY)
Admission: RE | Admit: 2012-12-21 | Discharge: 2012-12-21 | Disposition: A | Discharge status: Home / Self Care | Payer: Self-pay | Source: Ambulatory Visit | Attending: Cardiology | Admitting: Cardiology

## 2012-12-23 ENCOUNTER — Encounter (HOSPITAL_COMMUNITY)
Admission: RE | Admit: 2012-12-23 | Discharge: 2012-12-23 | Disposition: A | Discharge status: Home / Self Care | Payer: Self-pay | Source: Ambulatory Visit | Attending: Cardiology | Admitting: Cardiology

## 2012-12-24 ENCOUNTER — Encounter (HOSPITAL_COMMUNITY): Payer: Self-pay

## 2012-12-28 ENCOUNTER — Encounter (HOSPITAL_COMMUNITY): Payer: Self-pay

## 2012-12-30 ENCOUNTER — Encounter (HOSPITAL_COMMUNITY)
Admission: RE | Admit: 2012-12-30 | Discharge: 2012-12-30 | Disposition: A | Discharge status: Home / Self Care | Payer: Self-pay | Source: Ambulatory Visit | Attending: Cardiology | Admitting: Cardiology

## 2012-12-30 DIAGNOSIS — I2582 Chronic total occlusion of coronary artery: Secondary | ICD-10-CM | POA: Insufficient documentation

## 2012-12-30 DIAGNOSIS — I10 Essential (primary) hypertension: Secondary | ICD-10-CM | POA: Insufficient documentation

## 2012-12-30 DIAGNOSIS — I209 Angina pectoris, unspecified: Secondary | ICD-10-CM | POA: Insufficient documentation

## 2012-12-30 DIAGNOSIS — E785 Hyperlipidemia, unspecified: Secondary | ICD-10-CM | POA: Insufficient documentation

## 2012-12-30 DIAGNOSIS — Z9861 Coronary angioplasty status: Secondary | ICD-10-CM | POA: Insufficient documentation

## 2012-12-30 DIAGNOSIS — K219 Gastro-esophageal reflux disease without esophagitis: Secondary | ICD-10-CM | POA: Insufficient documentation

## 2012-12-30 DIAGNOSIS — Z5189 Encounter for other specified aftercare: Secondary | ICD-10-CM | POA: Insufficient documentation

## 2012-12-30 DIAGNOSIS — I2510 Atherosclerotic heart disease of native coronary artery without angina pectoris: Secondary | ICD-10-CM | POA: Insufficient documentation

## 2012-12-31 ENCOUNTER — Encounter (HOSPITAL_COMMUNITY)
Admission: RE | Admit: 2012-12-31 | Discharge: 2012-12-31 | Disposition: A | Discharge status: Home / Self Care | Payer: Self-pay | Source: Ambulatory Visit | Attending: Cardiology | Admitting: Cardiology

## 2013-01-04 ENCOUNTER — Encounter (HOSPITAL_COMMUNITY)
Admission: RE | Admit: 2013-01-04 | Discharge: 2013-01-04 | Disposition: A | Discharge status: Home / Self Care | Payer: Self-pay | Source: Ambulatory Visit | Attending: Cardiology | Admitting: Cardiology

## 2013-01-06 ENCOUNTER — Encounter (HOSPITAL_COMMUNITY)
Admission: RE | Admit: 2013-01-06 | Discharge: 2013-01-06 | Disposition: A | Discharge status: Home / Self Care | Payer: Self-pay | Source: Ambulatory Visit | Attending: Cardiology | Admitting: Cardiology

## 2013-01-07 ENCOUNTER — Encounter (HOSPITAL_COMMUNITY)
Admission: RE | Admit: 2013-01-07 | Discharge: 2013-01-07 | Disposition: A | Discharge status: Home / Self Care | Payer: Self-pay | Source: Ambulatory Visit | Attending: Cardiology | Admitting: Cardiology

## 2013-01-11 ENCOUNTER — Encounter (HOSPITAL_COMMUNITY): Payer: Self-pay

## 2013-01-13 ENCOUNTER — Encounter (HOSPITAL_COMMUNITY)
Admission: RE | Admit: 2013-01-13 | Discharge: 2013-01-13 | Disposition: A | Discharge status: Home / Self Care | Payer: Self-pay | Source: Ambulatory Visit | Attending: Cardiology | Admitting: Cardiology

## 2013-01-14 ENCOUNTER — Encounter (HOSPITAL_COMMUNITY)
Admission: RE | Admit: 2013-01-14 | Discharge: 2013-01-14 | Disposition: A | Discharge status: Home / Self Care | Payer: Self-pay | Source: Ambulatory Visit | Attending: Cardiology | Admitting: Cardiology

## 2013-01-18 ENCOUNTER — Encounter (HOSPITAL_COMMUNITY)
Admission: RE | Admit: 2013-01-18 | Discharge: 2013-01-18 | Disposition: A | Discharge status: Home / Self Care | Payer: Self-pay | Source: Ambulatory Visit | Attending: Cardiology | Admitting: Cardiology

## 2013-01-20 ENCOUNTER — Encounter (HOSPITAL_COMMUNITY)
Admission: RE | Admit: 2013-01-20 | Discharge: 2013-01-20 | Disposition: A | Discharge status: Home / Self Care | Payer: Self-pay | Source: Ambulatory Visit | Attending: Cardiology | Admitting: Cardiology

## 2013-01-21 ENCOUNTER — Encounter (HOSPITAL_COMMUNITY): Payer: Self-pay

## 2013-01-25 ENCOUNTER — Encounter (HOSPITAL_COMMUNITY)
Admission: RE | Admit: 2013-01-25 | Discharge: 2013-01-25 | Disposition: A | Discharge status: Home / Self Care | Payer: Self-pay | Source: Ambulatory Visit | Attending: Cardiology | Admitting: Cardiology

## 2013-01-27 ENCOUNTER — Encounter (HOSPITAL_COMMUNITY)
Admission: RE | Admit: 2013-01-27 | Discharge: 2013-01-27 | Disposition: A | Discharge status: Home / Self Care | Payer: Self-pay | Source: Ambulatory Visit | Attending: Cardiology | Admitting: Cardiology

## 2013-01-28 ENCOUNTER — Encounter (HOSPITAL_COMMUNITY)
Admission: RE | Admit: 2013-01-28 | Discharge: 2013-01-28 | Disposition: A | Discharge status: Home / Self Care | Payer: Self-pay | Source: Ambulatory Visit | Attending: Cardiology | Admitting: Cardiology

## 2013-02-01 ENCOUNTER — Encounter (HOSPITAL_COMMUNITY)
Admission: RE | Admit: 2013-02-01 | Discharge: 2013-02-01 | Disposition: A | Discharge status: Home / Self Care | Payer: Self-pay | Source: Ambulatory Visit | Attending: Cardiology | Admitting: Cardiology

## 2013-02-01 DIAGNOSIS — Z9861 Coronary angioplasty status: Secondary | ICD-10-CM | POA: Insufficient documentation

## 2013-02-01 DIAGNOSIS — I10 Essential (primary) hypertension: Secondary | ICD-10-CM | POA: Insufficient documentation

## 2013-02-01 DIAGNOSIS — Z5189 Encounter for other specified aftercare: Secondary | ICD-10-CM | POA: Insufficient documentation

## 2013-02-01 DIAGNOSIS — E785 Hyperlipidemia, unspecified: Secondary | ICD-10-CM | POA: Insufficient documentation

## 2013-02-01 DIAGNOSIS — I209 Angina pectoris, unspecified: Secondary | ICD-10-CM | POA: Insufficient documentation

## 2013-02-01 DIAGNOSIS — I2510 Atherosclerotic heart disease of native coronary artery without angina pectoris: Secondary | ICD-10-CM | POA: Insufficient documentation

## 2013-02-01 DIAGNOSIS — I2582 Chronic total occlusion of coronary artery: Secondary | ICD-10-CM | POA: Insufficient documentation

## 2013-02-01 DIAGNOSIS — K219 Gastro-esophageal reflux disease without esophagitis: Secondary | ICD-10-CM | POA: Insufficient documentation

## 2013-02-03 ENCOUNTER — Encounter (HOSPITAL_COMMUNITY)
Admission: RE | Admit: 2013-02-03 | Discharge: 2013-02-03 | Disposition: A | Discharge status: Home / Self Care | Payer: Self-pay | Source: Ambulatory Visit | Attending: Cardiology | Admitting: Cardiology

## 2013-02-04 ENCOUNTER — Encounter (HOSPITAL_COMMUNITY)
Admission: RE | Admit: 2013-02-04 | Discharge: 2013-02-04 | Disposition: A | Discharge status: Home / Self Care | Payer: Self-pay | Source: Ambulatory Visit | Attending: Cardiology | Admitting: Cardiology

## 2013-02-08 ENCOUNTER — Encounter (HOSPITAL_COMMUNITY)
Admission: RE | Admit: 2013-02-08 | Discharge: 2013-02-08 | Disposition: A | Discharge status: Home / Self Care | Payer: Self-pay | Source: Ambulatory Visit | Attending: Cardiology | Admitting: Cardiology

## 2013-02-09 ENCOUNTER — Encounter (HOSPITAL_COMMUNITY)
Admission: RE | Admit: 2013-02-09 | Discharge: 2013-02-09 | Disposition: A | Discharge status: Home / Self Care | Payer: Self-pay | Source: Ambulatory Visit | Attending: Cardiology | Admitting: Cardiology

## 2013-02-10 ENCOUNTER — Encounter (HOSPITAL_COMMUNITY): Payer: Self-pay

## 2013-02-11 ENCOUNTER — Telehealth: Payer: Self-pay | Admitting: Family Medicine

## 2013-02-11 ENCOUNTER — Encounter (HOSPITAL_COMMUNITY): Payer: Self-pay

## 2013-02-11 DIAGNOSIS — E7800 Pure hypercholesterolemia, unspecified: Secondary | ICD-10-CM

## 2013-02-11 MED ORDER — ATORVASTATIN CALCIUM 20 MG PO TABS: 20.0000 mg | ORAL_TABLET | Freq: Every day | ORAL | Status: DC

## 2013-02-11 NOTE — Telephone Encounter (Signed)
SENT IN ATROVASTATIN

## 2013-02-11 NOTE — Telephone Encounter (Signed)
Fax refill request from [facility] fro  Atorvastatin Calcium 20mg   90 day supply

## 2013-02-15 ENCOUNTER — Encounter (HOSPITAL_COMMUNITY)
Admission: RE | Admit: 2013-02-15 | Discharge: 2013-02-15 | Disposition: A | Discharge status: Home / Self Care | Payer: Self-pay | Source: Ambulatory Visit | Attending: Cardiology | Admitting: Cardiology

## 2013-02-17 ENCOUNTER — Encounter (HOSPITAL_COMMUNITY)
Admission: RE | Admit: 2013-02-17 | Discharge: 2013-02-17 | Disposition: A | Discharge status: Home / Self Care | Payer: Self-pay | Source: Ambulatory Visit | Attending: Cardiology | Admitting: Cardiology

## 2013-02-18 ENCOUNTER — Encounter (HOSPITAL_COMMUNITY)
Admission: RE | Admit: 2013-02-18 | Discharge: 2013-02-18 | Disposition: A | Discharge status: Home / Self Care | Payer: Self-pay | Source: Ambulatory Visit | Attending: Cardiology | Admitting: Cardiology

## 2013-02-22 ENCOUNTER — Encounter (HOSPITAL_COMMUNITY)
Admission: RE | Admit: 2013-02-22 | Discharge: 2013-02-22 | Disposition: A | Discharge status: Home / Self Care | Payer: Self-pay | Source: Ambulatory Visit | Attending: Cardiology | Admitting: Cardiology

## 2013-02-24 ENCOUNTER — Encounter (HOSPITAL_COMMUNITY)
Admission: RE | Admit: 2013-02-24 | Discharge: 2013-02-24 | Disposition: A | Discharge status: Home / Self Care | Payer: Self-pay | Source: Ambulatory Visit | Attending: Cardiology | Admitting: Cardiology

## 2013-02-25 ENCOUNTER — Encounter (HOSPITAL_COMMUNITY)
Admission: RE | Admit: 2013-02-25 | Discharge: 2013-02-25 | Disposition: A | Discharge status: Home / Self Care | Payer: Self-pay | Source: Ambulatory Visit | Attending: Cardiology | Admitting: Cardiology

## 2013-03-01 ENCOUNTER — Encounter (HOSPITAL_COMMUNITY): Payer: Self-pay

## 2013-03-01 DIAGNOSIS — I2582 Chronic total occlusion of coronary artery: Secondary | ICD-10-CM | POA: Insufficient documentation

## 2013-03-01 DIAGNOSIS — I2510 Atherosclerotic heart disease of native coronary artery without angina pectoris: Secondary | ICD-10-CM | POA: Insufficient documentation

## 2013-03-01 DIAGNOSIS — Z5189 Encounter for other specified aftercare: Secondary | ICD-10-CM | POA: Insufficient documentation

## 2013-03-01 DIAGNOSIS — Z9861 Coronary angioplasty status: Secondary | ICD-10-CM | POA: Insufficient documentation

## 2013-03-01 DIAGNOSIS — E785 Hyperlipidemia, unspecified: Secondary | ICD-10-CM | POA: Insufficient documentation

## 2013-03-01 DIAGNOSIS — I209 Angina pectoris, unspecified: Secondary | ICD-10-CM | POA: Insufficient documentation

## 2013-03-01 DIAGNOSIS — I10 Essential (primary) hypertension: Secondary | ICD-10-CM | POA: Insufficient documentation

## 2013-03-01 DIAGNOSIS — K219 Gastro-esophageal reflux disease without esophagitis: Secondary | ICD-10-CM | POA: Insufficient documentation

## 2013-03-02 ENCOUNTER — Encounter (HOSPITAL_COMMUNITY)
Admission: RE | Admit: 2013-03-02 | Discharge: 2013-03-02 | Disposition: A | Discharge status: Home / Self Care | Payer: Self-pay | Source: Ambulatory Visit | Attending: Cardiology | Admitting: Cardiology

## 2013-03-03 ENCOUNTER — Encounter (HOSPITAL_COMMUNITY)
Admission: RE | Admit: 2013-03-03 | Discharge: 2013-03-03 | Disposition: A | Discharge status: Home / Self Care | Payer: Self-pay | Source: Ambulatory Visit | Attending: Cardiology | Admitting: Cardiology

## 2013-03-04 ENCOUNTER — Encounter (HOSPITAL_COMMUNITY): Payer: Self-pay

## 2013-03-08 ENCOUNTER — Encounter (HOSPITAL_COMMUNITY)
Admission: RE | Admit: 2013-03-08 | Discharge: 2013-03-08 | Disposition: A | Discharge status: Home / Self Care | Payer: Self-pay | Source: Ambulatory Visit | Attending: Cardiology | Admitting: Cardiology

## 2013-03-10 ENCOUNTER — Encounter (HOSPITAL_COMMUNITY)
Admission: RE | Admit: 2013-03-10 | Discharge: 2013-03-10 | Disposition: A | Discharge status: Home / Self Care | Payer: Self-pay | Source: Ambulatory Visit | Attending: Cardiology | Admitting: Cardiology

## 2013-03-11 ENCOUNTER — Encounter (HOSPITAL_COMMUNITY)
Admission: RE | Admit: 2013-03-11 | Discharge: 2013-03-11 | Disposition: A | Discharge status: Home / Self Care | Payer: Self-pay | Source: Ambulatory Visit | Attending: Cardiology | Admitting: Cardiology

## 2013-03-15 ENCOUNTER — Encounter (HOSPITAL_COMMUNITY): Payer: Self-pay

## 2013-03-17 ENCOUNTER — Encounter (HOSPITAL_COMMUNITY)
Admission: RE | Admit: 2013-03-17 | Discharge: 2013-03-17 | Disposition: A | Discharge status: Home / Self Care | Payer: Self-pay | Source: Ambulatory Visit | Attending: Cardiology | Admitting: Cardiology

## 2013-03-18 ENCOUNTER — Encounter (HOSPITAL_COMMUNITY)
Admission: RE | Admit: 2013-03-18 | Discharge: 2013-03-18 | Disposition: A | Discharge status: Home / Self Care | Payer: Self-pay | Source: Ambulatory Visit | Attending: Cardiology | Admitting: Cardiology

## 2013-03-22 ENCOUNTER — Encounter (HOSPITAL_COMMUNITY)
Admission: RE | Admit: 2013-03-22 | Discharge: 2013-03-22 | Disposition: A | Discharge status: Home / Self Care | Payer: Self-pay | Source: Ambulatory Visit | Attending: Cardiology | Admitting: Cardiology

## 2013-03-24 ENCOUNTER — Encounter (HOSPITAL_COMMUNITY)
Admission: RE | Admit: 2013-03-24 | Discharge: 2013-03-24 | Disposition: A | Discharge status: Home / Self Care | Payer: Self-pay | Source: Ambulatory Visit | Attending: Cardiology | Admitting: Cardiology

## 2013-03-25 ENCOUNTER — Encounter (HOSPITAL_COMMUNITY)
Admission: RE | Admit: 2013-03-25 | Discharge: 2013-03-25 | Disposition: A | Discharge status: Home / Self Care | Payer: Self-pay | Source: Ambulatory Visit | Attending: Cardiology | Admitting: Cardiology

## 2013-03-29 ENCOUNTER — Encounter (HOSPITAL_COMMUNITY)
Admission: RE | Admit: 2013-03-29 | Discharge: 2013-03-29 | Disposition: A | Discharge status: Home / Self Care | Payer: Self-pay | Source: Ambulatory Visit | Attending: Cardiology | Admitting: Cardiology

## 2013-03-29 DIAGNOSIS — E785 Hyperlipidemia, unspecified: Secondary | ICD-10-CM | POA: Insufficient documentation

## 2013-03-29 DIAGNOSIS — I2582 Chronic total occlusion of coronary artery: Secondary | ICD-10-CM | POA: Insufficient documentation

## 2013-03-29 DIAGNOSIS — Z5189 Encounter for other specified aftercare: Secondary | ICD-10-CM | POA: Insufficient documentation

## 2013-03-29 DIAGNOSIS — I10 Essential (primary) hypertension: Secondary | ICD-10-CM | POA: Insufficient documentation

## 2013-03-29 DIAGNOSIS — I209 Angina pectoris, unspecified: Secondary | ICD-10-CM | POA: Insufficient documentation

## 2013-03-29 DIAGNOSIS — K219 Gastro-esophageal reflux disease without esophagitis: Secondary | ICD-10-CM | POA: Insufficient documentation

## 2013-03-29 DIAGNOSIS — I2510 Atherosclerotic heart disease of native coronary artery without angina pectoris: Secondary | ICD-10-CM | POA: Insufficient documentation

## 2013-03-29 DIAGNOSIS — Z9861 Coronary angioplasty status: Secondary | ICD-10-CM | POA: Insufficient documentation

## 2013-03-31 ENCOUNTER — Encounter (HOSPITAL_COMMUNITY)
Admission: RE | Admit: 2013-03-31 | Discharge: 2013-03-31 | Disposition: A | Discharge status: Home / Self Care | Payer: Self-pay | Source: Ambulatory Visit | Attending: Cardiology | Admitting: Cardiology

## 2013-04-01 ENCOUNTER — Encounter (HOSPITAL_COMMUNITY)
Admission: RE | Admit: 2013-04-01 | Discharge: 2013-04-01 | Disposition: A | Discharge status: Home / Self Care | Payer: Self-pay | Source: Ambulatory Visit | Attending: Cardiology | Admitting: Cardiology

## 2013-04-02 ENCOUNTER — Other Ambulatory Visit: Payer: Self-pay | Admitting: Family Medicine

## 2013-04-04 ENCOUNTER — Telehealth: Payer: Self-pay | Admitting: Family Medicine

## 2013-04-04 DIAGNOSIS — F411 Generalized anxiety disorder: Secondary | ICD-10-CM

## 2013-04-04 MED ORDER — CITALOPRAM HYDROBROMIDE 40 MG PO TABS: 40.0000 mg | ORAL_TABLET | Freq: Every day | ORAL | Status: DC

## 2013-04-04 NOTE — Telephone Encounter (Signed)
done

## 2013-04-05 ENCOUNTER — Encounter (HOSPITAL_COMMUNITY)
Admission: RE | Admit: 2013-04-05 | Discharge: 2013-04-05 | Disposition: A | Discharge status: Home / Self Care | Payer: Self-pay | Source: Ambulatory Visit | Attending: Cardiology | Admitting: Cardiology

## 2013-04-07 ENCOUNTER — Encounter (HOSPITAL_COMMUNITY)
Admission: RE | Admit: 2013-04-07 | Discharge: 2013-04-07 | Disposition: A | Discharge status: Home / Self Care | Payer: Self-pay | Source: Ambulatory Visit | Attending: Cardiology | Admitting: Cardiology

## 2013-04-08 ENCOUNTER — Encounter (HOSPITAL_COMMUNITY)
Admission: RE | Admit: 2013-04-08 | Discharge: 2013-04-08 | Disposition: A | Discharge status: Home / Self Care | Payer: Self-pay | Source: Ambulatory Visit | Attending: Cardiology | Admitting: Cardiology

## 2013-04-08 ENCOUNTER — Other Ambulatory Visit: Payer: BC Managed Care – PPO

## 2013-04-08 DIAGNOSIS — E7800 Pure hypercholesterolemia, unspecified: Secondary | ICD-10-CM

## 2013-04-08 LAB — LIPID PANEL
Cholesterol: 150 mg/dL (ref 0–200)
Total CHOL/HDL Ratio: 3.3 Ratio
VLDL: 24 mg/dL (ref 0–40)

## 2013-04-12 ENCOUNTER — Encounter (HOSPITAL_COMMUNITY)
Admission: RE | Admit: 2013-04-12 | Discharge: 2013-04-12 | Disposition: A | Discharge status: Home / Self Care | Payer: Self-pay | Source: Ambulatory Visit | Attending: Cardiology | Admitting: Cardiology

## 2013-04-14 ENCOUNTER — Encounter (HOSPITAL_COMMUNITY): Payer: Self-pay

## 2013-04-15 ENCOUNTER — Encounter (HOSPITAL_COMMUNITY): Payer: Self-pay

## 2013-04-19 ENCOUNTER — Encounter (HOSPITAL_COMMUNITY): Payer: Self-pay

## 2013-04-21 ENCOUNTER — Encounter (HOSPITAL_COMMUNITY)
Admission: RE | Admit: 2013-04-21 | Discharge: 2013-04-21 | Disposition: A | Discharge status: Home / Self Care | Payer: Self-pay | Source: Ambulatory Visit | Attending: Cardiology | Admitting: Cardiology

## 2013-04-22 ENCOUNTER — Encounter (HOSPITAL_COMMUNITY)
Admission: RE | Admit: 2013-04-22 | Discharge: 2013-04-22 | Disposition: A | Discharge status: Home / Self Care | Payer: Self-pay | Source: Ambulatory Visit | Attending: Cardiology | Admitting: Cardiology

## 2013-04-26 ENCOUNTER — Encounter (HOSPITAL_COMMUNITY)
Admission: RE | Admit: 2013-04-26 | Discharge: 2013-04-26 | Disposition: A | Discharge status: Home / Self Care | Payer: Self-pay | Source: Ambulatory Visit | Attending: Cardiology | Admitting: Cardiology

## 2013-04-28 ENCOUNTER — Encounter (HOSPITAL_COMMUNITY)
Admission: RE | Admit: 2013-04-28 | Discharge: 2013-04-28 | Disposition: A | Discharge status: Home / Self Care | Payer: Self-pay | Source: Ambulatory Visit | Attending: Cardiology | Admitting: Cardiology

## 2013-04-28 DIAGNOSIS — Z5189 Encounter for other specified aftercare: Secondary | ICD-10-CM | POA: Insufficient documentation

## 2013-04-28 DIAGNOSIS — I10 Essential (primary) hypertension: Secondary | ICD-10-CM | POA: Insufficient documentation

## 2013-04-28 DIAGNOSIS — I209 Angina pectoris, unspecified: Secondary | ICD-10-CM | POA: Insufficient documentation

## 2013-04-28 DIAGNOSIS — E785 Hyperlipidemia, unspecified: Secondary | ICD-10-CM | POA: Insufficient documentation

## 2013-04-28 DIAGNOSIS — I2510 Atherosclerotic heart disease of native coronary artery without angina pectoris: Secondary | ICD-10-CM | POA: Insufficient documentation

## 2013-04-28 DIAGNOSIS — K219 Gastro-esophageal reflux disease without esophagitis: Secondary | ICD-10-CM | POA: Insufficient documentation

## 2013-04-28 DIAGNOSIS — I2582 Chronic total occlusion of coronary artery: Secondary | ICD-10-CM | POA: Insufficient documentation

## 2013-04-28 DIAGNOSIS — Z9861 Coronary angioplasty status: Secondary | ICD-10-CM | POA: Insufficient documentation

## 2013-04-29 ENCOUNTER — Encounter (HOSPITAL_COMMUNITY): Payer: Self-pay

## 2013-05-03 ENCOUNTER — Encounter (HOSPITAL_COMMUNITY)
Admission: RE | Admit: 2013-05-03 | Discharge: 2013-05-03 | Disposition: A | Discharge status: Home / Self Care | Payer: Self-pay | Source: Ambulatory Visit | Attending: Cardiology | Admitting: Cardiology

## 2013-05-05 ENCOUNTER — Encounter (HOSPITAL_COMMUNITY)
Admission: RE | Admit: 2013-05-05 | Discharge: 2013-05-05 | Disposition: A | Discharge status: Home / Self Care | Payer: Self-pay | Source: Ambulatory Visit | Attending: Cardiology | Admitting: Cardiology

## 2013-05-06 ENCOUNTER — Encounter (HOSPITAL_COMMUNITY)
Admission: RE | Admit: 2013-05-06 | Discharge: 2013-05-06 | Disposition: A | Discharge status: Home / Self Care | Payer: Self-pay | Source: Ambulatory Visit | Attending: Cardiology | Admitting: Cardiology

## 2013-05-10 ENCOUNTER — Encounter (HOSPITAL_COMMUNITY)
Admission: RE | Admit: 2013-05-10 | Discharge: 2013-05-10 | Disposition: A | Discharge status: Home / Self Care | Payer: Self-pay | Source: Ambulatory Visit | Attending: Cardiology | Admitting: Cardiology

## 2013-05-12 ENCOUNTER — Encounter (HOSPITAL_COMMUNITY)
Admission: RE | Admit: 2013-05-12 | Discharge: 2013-05-12 | Disposition: A | Discharge status: Home / Self Care | Payer: Self-pay | Source: Ambulatory Visit | Attending: Cardiology | Admitting: Cardiology

## 2013-05-13 ENCOUNTER — Encounter (HOSPITAL_COMMUNITY)
Admission: RE | Admit: 2013-05-13 | Discharge: 2013-05-13 | Disposition: A | Discharge status: Home / Self Care | Payer: Self-pay | Source: Ambulatory Visit | Attending: Cardiology | Admitting: Cardiology

## 2013-05-17 ENCOUNTER — Encounter (HOSPITAL_COMMUNITY)
Admission: RE | Admit: 2013-05-17 | Discharge: 2013-05-17 | Disposition: A | Discharge status: Home / Self Care | Payer: Self-pay | Source: Ambulatory Visit | Attending: Cardiology | Admitting: Cardiology

## 2013-05-19 ENCOUNTER — Encounter (HOSPITAL_COMMUNITY)
Admission: RE | Admit: 2013-05-19 | Discharge: 2013-05-19 | Disposition: A | Discharge status: Home / Self Care | Payer: Self-pay | Source: Ambulatory Visit | Attending: Cardiology | Admitting: Cardiology

## 2013-05-20 ENCOUNTER — Encounter (HOSPITAL_COMMUNITY)
Admission: RE | Admit: 2013-05-20 | Discharge: 2013-05-20 | Disposition: A | Discharge status: Home / Self Care | Payer: Self-pay | Source: Ambulatory Visit | Attending: Cardiology | Admitting: Cardiology

## 2013-05-24 ENCOUNTER — Encounter (HOSPITAL_COMMUNITY)
Admission: RE | Admit: 2013-05-24 | Discharge: 2013-05-24 | Disposition: A | Discharge status: Home / Self Care | Payer: Self-pay | Source: Ambulatory Visit | Attending: Cardiology | Admitting: Cardiology

## 2013-05-26 ENCOUNTER — Encounter (HOSPITAL_COMMUNITY)
Admission: RE | Admit: 2013-05-26 | Discharge: 2013-05-26 | Disposition: A | Discharge status: Home / Self Care | Payer: Self-pay | Source: Ambulatory Visit | Attending: Cardiology | Admitting: Cardiology

## 2013-05-27 ENCOUNTER — Encounter (HOSPITAL_COMMUNITY)
Admission: RE | Admit: 2013-05-27 | Discharge: 2013-05-27 | Disposition: A | Discharge status: Home / Self Care | Payer: Self-pay | Source: Ambulatory Visit | Attending: Cardiology | Admitting: Cardiology

## 2013-05-31 ENCOUNTER — Encounter (HOSPITAL_COMMUNITY)
Admission: RE | Admit: 2013-05-31 | Discharge: 2013-05-31 | Disposition: A | Discharge status: Home / Self Care | Payer: Self-pay | Source: Ambulatory Visit | Attending: Cardiology | Admitting: Cardiology

## 2013-05-31 DIAGNOSIS — Z5189 Encounter for other specified aftercare: Secondary | ICD-10-CM | POA: Insufficient documentation

## 2013-05-31 DIAGNOSIS — I2510 Atherosclerotic heart disease of native coronary artery without angina pectoris: Secondary | ICD-10-CM | POA: Insufficient documentation

## 2013-05-31 DIAGNOSIS — I10 Essential (primary) hypertension: Secondary | ICD-10-CM | POA: Insufficient documentation

## 2013-05-31 DIAGNOSIS — Z9861 Coronary angioplasty status: Secondary | ICD-10-CM | POA: Insufficient documentation

## 2013-05-31 DIAGNOSIS — I2582 Chronic total occlusion of coronary artery: Secondary | ICD-10-CM | POA: Insufficient documentation

## 2013-06-02 ENCOUNTER — Encounter (HOSPITAL_COMMUNITY)
Admission: RE | Admit: 2013-06-02 | Discharge: 2013-06-02 | Disposition: A | Discharge status: Home / Self Care | Payer: Self-pay | Source: Ambulatory Visit | Attending: Cardiology | Admitting: Cardiology

## 2013-06-03 ENCOUNTER — Encounter (HOSPITAL_COMMUNITY)
Admission: RE | Admit: 2013-06-03 | Discharge: 2013-06-03 | Disposition: A | Discharge status: Home / Self Care | Payer: Self-pay | Source: Ambulatory Visit | Attending: Cardiology | Admitting: Cardiology

## 2013-06-07 ENCOUNTER — Encounter (HOSPITAL_COMMUNITY)
Admission: RE | Admit: 2013-06-07 | Discharge: 2013-06-07 | Disposition: A | Discharge status: Home / Self Care | Payer: Self-pay | Source: Ambulatory Visit | Attending: Cardiology | Admitting: Cardiology

## 2013-06-09 ENCOUNTER — Encounter (HOSPITAL_COMMUNITY)
Admission: RE | Admit: 2013-06-09 | Discharge: 2013-06-09 | Disposition: A | Discharge status: Home / Self Care | Payer: Self-pay | Source: Ambulatory Visit | Attending: Cardiology | Admitting: Cardiology

## 2013-06-10 ENCOUNTER — Encounter (HOSPITAL_COMMUNITY): Payer: Self-pay

## 2013-06-14 ENCOUNTER — Encounter (HOSPITAL_COMMUNITY)
Admission: RE | Admit: 2013-06-14 | Discharge: 2013-06-14 | Disposition: A | Discharge status: Home / Self Care | Payer: Self-pay | Source: Ambulatory Visit | Attending: Cardiology | Admitting: Cardiology

## 2013-06-16 ENCOUNTER — Encounter (HOSPITAL_COMMUNITY)
Admission: RE | Admit: 2013-06-16 | Discharge: 2013-06-16 | Disposition: A | Discharge status: Home / Self Care | Payer: Self-pay | Source: Ambulatory Visit | Attending: Cardiology | Admitting: Cardiology

## 2013-06-17 ENCOUNTER — Encounter (HOSPITAL_COMMUNITY)
Admission: RE | Admit: 2013-06-17 | Discharge: 2013-06-17 | Disposition: A | Discharge status: Home / Self Care | Payer: Self-pay | Source: Ambulatory Visit | Attending: Cardiology | Admitting: Cardiology

## 2013-06-21 ENCOUNTER — Encounter (HOSPITAL_COMMUNITY)
Admission: RE | Admit: 2013-06-21 | Discharge: 2013-06-21 | Disposition: A | Discharge status: Home / Self Care | Payer: Self-pay | Source: Ambulatory Visit | Attending: Cardiology | Admitting: Cardiology

## 2013-06-23 ENCOUNTER — Encounter (HOSPITAL_COMMUNITY)
Admission: RE | Admit: 2013-06-23 | Discharge: 2013-06-23 | Disposition: A | Discharge status: Home / Self Care | Payer: Self-pay | Source: Ambulatory Visit | Attending: Cardiology | Admitting: Cardiology

## 2013-06-24 ENCOUNTER — Encounter (HOSPITAL_COMMUNITY)
Admission: RE | Admit: 2013-06-24 | Discharge: 2013-06-24 | Disposition: A | Discharge status: Home / Self Care | Payer: Self-pay | Source: Ambulatory Visit | Attending: Cardiology | Admitting: Cardiology

## 2013-06-27 ENCOUNTER — Telehealth: Payer: Self-pay | Admitting: Internal Medicine

## 2013-06-27 DIAGNOSIS — F411 Generalized anxiety disorder: Secondary | ICD-10-CM

## 2013-06-27 MED ORDER — CITALOPRAM HYDROBROMIDE 40 MG PO TABS: 40.0000 mg | ORAL_TABLET | Freq: Every day | ORAL | Status: DC

## 2013-06-27 NOTE — Telephone Encounter (Signed)
Refill request for citalopram 40mg #90 to [facility] 

## 2013-06-27 NOTE — Telephone Encounter (Signed)
done

## 2013-06-28 ENCOUNTER — Encounter (HOSPITAL_COMMUNITY): Payer: Self-pay

## 2013-06-28 DIAGNOSIS — I10 Essential (primary) hypertension: Secondary | ICD-10-CM | POA: Insufficient documentation

## 2013-06-28 DIAGNOSIS — I2582 Chronic total occlusion of coronary artery: Secondary | ICD-10-CM | POA: Insufficient documentation

## 2013-06-28 DIAGNOSIS — Z9861 Coronary angioplasty status: Secondary | ICD-10-CM | POA: Insufficient documentation

## 2013-06-28 DIAGNOSIS — Z5189 Encounter for other specified aftercare: Secondary | ICD-10-CM | POA: Insufficient documentation

## 2013-06-28 DIAGNOSIS — I2510 Atherosclerotic heart disease of native coronary artery without angina pectoris: Secondary | ICD-10-CM | POA: Insufficient documentation

## 2013-06-30 ENCOUNTER — Encounter (HOSPITAL_COMMUNITY): Payer: Self-pay

## 2013-07-05 ENCOUNTER — Encounter (HOSPITAL_COMMUNITY): Payer: Self-pay

## 2013-07-07 ENCOUNTER — Encounter (HOSPITAL_COMMUNITY)
Admission: RE | Admit: 2013-07-07 | Discharge: 2013-07-07 | Disposition: A | Discharge status: Home / Self Care | Payer: Self-pay | Source: Ambulatory Visit | Attending: Cardiology | Admitting: Cardiology

## 2013-07-08 ENCOUNTER — Encounter (HOSPITAL_COMMUNITY)
Admission: RE | Admit: 2013-07-08 | Discharge: 2013-07-08 | Disposition: A | Discharge status: Home / Self Care | Payer: Self-pay | Source: Ambulatory Visit | Attending: Cardiology | Admitting: Cardiology

## 2013-07-12 ENCOUNTER — Encounter (HOSPITAL_COMMUNITY)
Admission: RE | Admit: 2013-07-12 | Discharge: 2013-07-12 | Disposition: A | Discharge status: Home / Self Care | Payer: Self-pay | Source: Ambulatory Visit | Attending: Cardiology | Admitting: Cardiology

## 2013-07-14 ENCOUNTER — Encounter (HOSPITAL_COMMUNITY)
Admission: RE | Admit: 2013-07-14 | Discharge: 2013-07-14 | Disposition: A | Discharge status: Home / Self Care | Payer: Self-pay | Source: Ambulatory Visit | Attending: Cardiology | Admitting: Cardiology

## 2013-07-15 ENCOUNTER — Encounter (HOSPITAL_COMMUNITY): Payer: Self-pay

## 2013-07-19 ENCOUNTER — Encounter (HOSPITAL_COMMUNITY)
Admission: RE | Admit: 2013-07-19 | Discharge: 2013-07-19 | Disposition: A | Discharge status: Home / Self Care | Payer: Self-pay | Source: Ambulatory Visit | Attending: Cardiology | Admitting: Cardiology

## 2013-07-21 ENCOUNTER — Encounter (HOSPITAL_COMMUNITY)
Admission: RE | Admit: 2013-07-21 | Discharge: 2013-07-21 | Disposition: A | Discharge status: Home / Self Care | Payer: Self-pay | Source: Ambulatory Visit | Attending: Cardiology | Admitting: Cardiology

## 2013-07-22 ENCOUNTER — Encounter (HOSPITAL_COMMUNITY)
Admission: RE | Admit: 2013-07-22 | Discharge: 2013-07-22 | Disposition: A | Discharge status: Home / Self Care | Payer: Self-pay | Source: Ambulatory Visit | Attending: Cardiology | Admitting: Cardiology

## 2013-07-26 ENCOUNTER — Encounter (HOSPITAL_COMMUNITY): Payer: Self-pay

## 2013-07-28 ENCOUNTER — Encounter (HOSPITAL_COMMUNITY)
Admission: RE | Admit: 2013-07-28 | Discharge: 2013-07-28 | Disposition: A | Discharge status: Home / Self Care | Payer: Self-pay | Source: Ambulatory Visit | Attending: Cardiology | Admitting: Cardiology

## 2013-07-29 ENCOUNTER — Encounter (HOSPITAL_COMMUNITY): Payer: Self-pay

## 2013-07-29 DIAGNOSIS — I2510 Atherosclerotic heart disease of native coronary artery without angina pectoris: Secondary | ICD-10-CM | POA: Insufficient documentation

## 2013-07-29 DIAGNOSIS — I10 Essential (primary) hypertension: Secondary | ICD-10-CM | POA: Insufficient documentation

## 2013-07-29 DIAGNOSIS — Z5189 Encounter for other specified aftercare: Secondary | ICD-10-CM | POA: Insufficient documentation

## 2013-07-29 DIAGNOSIS — I2582 Chronic total occlusion of coronary artery: Secondary | ICD-10-CM | POA: Insufficient documentation

## 2013-07-29 DIAGNOSIS — Z9861 Coronary angioplasty status: Secondary | ICD-10-CM | POA: Insufficient documentation

## 2013-08-02 ENCOUNTER — Encounter (HOSPITAL_COMMUNITY)
Admission: RE | Admit: 2013-08-02 | Discharge: 2013-08-02 | Disposition: A | Discharge status: Home / Self Care | Payer: Self-pay | Source: Ambulatory Visit | Attending: Cardiology | Admitting: Cardiology

## 2013-08-04 ENCOUNTER — Encounter (HOSPITAL_COMMUNITY)
Admission: RE | Admit: 2013-08-04 | Discharge: 2013-08-04 | Disposition: A | Discharge status: Home / Self Care | Payer: Self-pay | Source: Ambulatory Visit | Attending: Cardiology | Admitting: Cardiology

## 2013-08-05 ENCOUNTER — Encounter (HOSPITAL_COMMUNITY)
Admission: RE | Admit: 2013-08-05 | Discharge: 2013-08-05 | Disposition: A | Discharge status: Home / Self Care | Payer: Self-pay | Source: Ambulatory Visit | Attending: Cardiology | Admitting: Cardiology

## 2013-08-09 ENCOUNTER — Encounter (HOSPITAL_COMMUNITY): Payer: Self-pay

## 2013-08-11 ENCOUNTER — Encounter (HOSPITAL_COMMUNITY)
Admission: RE | Admit: 2013-08-11 | Discharge: 2013-08-11 | Disposition: A | Discharge status: Home / Self Care | Payer: Self-pay | Source: Ambulatory Visit | Attending: Cardiology | Admitting: Cardiology

## 2013-08-12 ENCOUNTER — Encounter (HOSPITAL_COMMUNITY)
Admission: RE | Admit: 2013-08-12 | Discharge: 2013-08-12 | Disposition: A | Discharge status: Home / Self Care | Payer: Self-pay | Source: Ambulatory Visit | Attending: Cardiology | Admitting: Cardiology

## 2013-08-16 ENCOUNTER — Encounter (HOSPITAL_COMMUNITY): Payer: Self-pay

## 2013-08-18 ENCOUNTER — Encounter (HOSPITAL_COMMUNITY)
Admission: RE | Admit: 2013-08-18 | Discharge: 2013-08-18 | Disposition: A | Discharge status: Home / Self Care | Payer: Self-pay | Source: Ambulatory Visit | Attending: Cardiology | Admitting: Cardiology

## 2013-08-19 ENCOUNTER — Encounter (HOSPITAL_COMMUNITY)
Admission: RE | Admit: 2013-08-19 | Discharge: 2013-08-19 | Disposition: A | Discharge status: Home / Self Care | Payer: Self-pay | Source: Ambulatory Visit | Attending: Cardiology | Admitting: Cardiology

## 2013-08-23 ENCOUNTER — Encounter (HOSPITAL_COMMUNITY)
Admission: RE | Admit: 2013-08-23 | Discharge: 2013-08-23 | Disposition: A | Discharge status: Home / Self Care | Payer: Self-pay | Source: Ambulatory Visit | Attending: Cardiology | Admitting: Cardiology

## 2013-08-25 ENCOUNTER — Encounter (HOSPITAL_COMMUNITY)
Admission: RE | Admit: 2013-08-25 | Discharge: 2013-08-25 | Disposition: A | Discharge status: Home / Self Care | Payer: Self-pay | Source: Ambulatory Visit | Attending: Cardiology | Admitting: Cardiology

## 2013-08-26 ENCOUNTER — Encounter (HOSPITAL_COMMUNITY): Payer: Self-pay

## 2013-08-30 ENCOUNTER — Encounter (HOSPITAL_COMMUNITY): Payer: BC Managed Care – PPO

## 2013-08-30 DIAGNOSIS — Z9861 Coronary angioplasty status: Secondary | ICD-10-CM | POA: Insufficient documentation

## 2013-08-30 DIAGNOSIS — I10 Essential (primary) hypertension: Secondary | ICD-10-CM | POA: Insufficient documentation

## 2013-08-30 DIAGNOSIS — Z5189 Encounter for other specified aftercare: Secondary | ICD-10-CM | POA: Insufficient documentation

## 2013-08-30 DIAGNOSIS — I2510 Atherosclerotic heart disease of native coronary artery without angina pectoris: Secondary | ICD-10-CM | POA: Insufficient documentation

## 2013-08-30 DIAGNOSIS — I2582 Chronic total occlusion of coronary artery: Secondary | ICD-10-CM | POA: Insufficient documentation

## 2013-09-01 ENCOUNTER — Encounter (HOSPITAL_COMMUNITY)
Admission: RE | Admit: 2013-09-01 | Discharge: 2013-09-01 | Disposition: A | Discharge status: Home / Self Care | Payer: Self-pay | Source: Ambulatory Visit | Attending: Cardiology | Admitting: Cardiology

## 2013-09-02 ENCOUNTER — Encounter (HOSPITAL_COMMUNITY)
Admission: RE | Admit: 2013-09-02 | Discharge: 2013-09-02 | Disposition: A | Discharge status: Home / Self Care | Payer: BC Managed Care – PPO | Source: Ambulatory Visit | Attending: Cardiology | Admitting: Cardiology

## 2013-09-06 ENCOUNTER — Encounter (HOSPITAL_COMMUNITY)
Admission: RE | Admit: 2013-09-06 | Discharge: 2013-09-06 | Disposition: A | Discharge status: Home / Self Care | Payer: Self-pay | Source: Ambulatory Visit | Attending: Cardiology | Admitting: Cardiology

## 2013-09-08 ENCOUNTER — Encounter (HOSPITAL_COMMUNITY)
Admission: RE | Admit: 2013-09-08 | Discharge: 2013-09-08 | Disposition: A | Discharge status: Home / Self Care | Payer: Self-pay | Source: Ambulatory Visit | Attending: Cardiology | Admitting: Cardiology

## 2013-09-09 ENCOUNTER — Encounter (HOSPITAL_COMMUNITY)
Admission: RE | Admit: 2013-09-09 | Discharge: 2013-09-09 | Disposition: A | Discharge status: Home / Self Care | Payer: Self-pay | Source: Ambulatory Visit | Attending: Cardiology | Admitting: Cardiology

## 2013-09-13 ENCOUNTER — Encounter (HOSPITAL_COMMUNITY)
Admission: RE | Admit: 2013-09-13 | Discharge: 2013-09-13 | Disposition: A | Discharge status: Home / Self Care | Payer: Self-pay | Source: Ambulatory Visit | Attending: Cardiology | Admitting: Cardiology

## 2013-09-15 ENCOUNTER — Encounter (HOSPITAL_COMMUNITY)
Admission: RE | Admit: 2013-09-15 | Discharge: 2013-09-15 | Disposition: A | Discharge status: Home / Self Care | Payer: Self-pay | Source: Ambulatory Visit | Attending: Cardiology | Admitting: Cardiology

## 2013-09-16 ENCOUNTER — Encounter (HOSPITAL_COMMUNITY)
Admission: RE | Admit: 2013-09-16 | Discharge: 2013-09-16 | Disposition: A | Discharge status: Home / Self Care | Payer: Self-pay | Source: Ambulatory Visit | Attending: Cardiology | Admitting: Cardiology

## 2013-09-20 ENCOUNTER — Encounter (HOSPITAL_COMMUNITY)
Admission: RE | Admit: 2013-09-20 | Discharge: 2013-09-20 | Disposition: A | Discharge status: Home / Self Care | Payer: Self-pay | Source: Ambulatory Visit | Attending: Cardiology | Admitting: Cardiology

## 2013-09-22 ENCOUNTER — Encounter (HOSPITAL_COMMUNITY)
Admission: RE | Admit: 2013-09-22 | Discharge: 2013-09-22 | Disposition: A | Discharge status: Home / Self Care | Payer: Self-pay | Source: Ambulatory Visit | Attending: Cardiology | Admitting: Cardiology

## 2013-09-23 ENCOUNTER — Encounter (HOSPITAL_COMMUNITY): Payer: BC Managed Care – PPO

## 2013-09-27 ENCOUNTER — Encounter (HOSPITAL_COMMUNITY)
Admission: RE | Admit: 2013-09-27 | Discharge: 2013-09-27 | Disposition: A | Discharge status: Home / Self Care | Payer: Self-pay | Source: Ambulatory Visit | Attending: Cardiology | Admitting: Cardiology

## 2013-09-29 ENCOUNTER — Encounter (HOSPITAL_COMMUNITY)
Admission: RE | Admit: 2013-09-29 | Discharge: 2013-09-29 | Disposition: A | Discharge status: Home / Self Care | Payer: Self-pay | Source: Ambulatory Visit | Attending: Cardiology | Admitting: Cardiology

## 2013-09-29 DIAGNOSIS — Z9861 Coronary angioplasty status: Secondary | ICD-10-CM | POA: Insufficient documentation

## 2013-09-29 DIAGNOSIS — I2582 Chronic total occlusion of coronary artery: Secondary | ICD-10-CM | POA: Insufficient documentation

## 2013-09-29 DIAGNOSIS — Z5189 Encounter for other specified aftercare: Secondary | ICD-10-CM | POA: Insufficient documentation

## 2013-09-29 DIAGNOSIS — I10 Essential (primary) hypertension: Secondary | ICD-10-CM | POA: Insufficient documentation

## 2013-09-29 DIAGNOSIS — I2510 Atherosclerotic heart disease of native coronary artery without angina pectoris: Secondary | ICD-10-CM | POA: Insufficient documentation

## 2013-09-30 ENCOUNTER — Encounter (HOSPITAL_COMMUNITY)
Admission: RE | Admit: 2013-09-30 | Discharge: 2013-09-30 | Disposition: A | Discharge status: Home / Self Care | Payer: Self-pay | Source: Ambulatory Visit | Attending: Cardiology | Admitting: Cardiology

## 2013-10-04 ENCOUNTER — Encounter (HOSPITAL_COMMUNITY)
Admission: RE | Admit: 2013-10-04 | Discharge: 2013-10-04 | Disposition: A | Discharge status: Home / Self Care | Payer: Self-pay | Source: Ambulatory Visit | Attending: Cardiology | Admitting: Cardiology

## 2013-10-06 ENCOUNTER — Encounter (HOSPITAL_COMMUNITY)
Admission: RE | Admit: 2013-10-06 | Discharge: 2013-10-06 | Disposition: A | Discharge status: Home / Self Care | Payer: Self-pay | Source: Ambulatory Visit | Attending: Cardiology | Admitting: Cardiology

## 2013-10-07 ENCOUNTER — Encounter (HOSPITAL_COMMUNITY)
Admission: RE | Admit: 2013-10-07 | Discharge: 2013-10-07 | Disposition: A | Discharge status: Home / Self Care | Payer: Self-pay | Source: Ambulatory Visit | Attending: Cardiology | Admitting: Cardiology

## 2013-10-11 ENCOUNTER — Encounter (HOSPITAL_COMMUNITY)
Admission: RE | Admit: 2013-10-11 | Discharge: 2013-10-11 | Disposition: A | Discharge status: Home / Self Care | Payer: Self-pay | Source: Ambulatory Visit | Attending: Cardiology | Admitting: Cardiology

## 2013-10-13 ENCOUNTER — Encounter (HOSPITAL_COMMUNITY)
Admission: RE | Admit: 2013-10-13 | Discharge: 2013-10-13 | Disposition: A | Discharge status: Home / Self Care | Payer: Self-pay | Source: Ambulatory Visit | Attending: Cardiology | Admitting: Cardiology

## 2013-10-14 ENCOUNTER — Encounter (HOSPITAL_COMMUNITY)
Admission: RE | Admit: 2013-10-14 | Discharge: 2013-10-14 | Disposition: A | Discharge status: Home / Self Care | Payer: Self-pay | Source: Ambulatory Visit | Attending: Cardiology | Admitting: Cardiology

## 2013-10-18 ENCOUNTER — Encounter (HOSPITAL_COMMUNITY)
Admission: RE | Admit: 2013-10-18 | Discharge: 2013-10-18 | Disposition: A | Discharge status: Home / Self Care | Payer: Self-pay | Source: Ambulatory Visit | Attending: Cardiology | Admitting: Cardiology

## 2013-10-20 ENCOUNTER — Encounter (HOSPITAL_COMMUNITY)
Admission: RE | Admit: 2013-10-20 | Discharge: 2013-10-20 | Disposition: A | Discharge status: Home / Self Care | Payer: Self-pay | Source: Ambulatory Visit | Attending: Cardiology | Admitting: Cardiology

## 2013-10-21 ENCOUNTER — Encounter (HOSPITAL_COMMUNITY)
Admission: RE | Admit: 2013-10-21 | Discharge: 2013-10-21 | Disposition: A | Discharge status: Home / Self Care | Payer: Self-pay | Source: Ambulatory Visit | Attending: Cardiology | Admitting: Cardiology

## 2013-10-25 ENCOUNTER — Encounter (HOSPITAL_COMMUNITY)
Admission: RE | Admit: 2013-10-25 | Discharge: 2013-10-25 | Disposition: A | Discharge status: Home / Self Care | Payer: Self-pay | Source: Ambulatory Visit | Attending: Cardiology | Admitting: Cardiology

## 2013-10-26 ENCOUNTER — Other Ambulatory Visit (INDEPENDENT_AMBULATORY_CARE_PROVIDER_SITE_OTHER): Payer: BC Managed Care – PPO

## 2013-10-26 DIAGNOSIS — Z23 Encounter for immunization: Secondary | ICD-10-CM

## 2013-10-27 ENCOUNTER — Encounter (HOSPITAL_COMMUNITY)
Admission: RE | Admit: 2013-10-27 | Discharge: 2013-10-27 | Disposition: A | Discharge status: Home / Self Care | Payer: Self-pay | Source: Ambulatory Visit | Attending: Cardiology | Admitting: Cardiology

## 2013-10-28 ENCOUNTER — Encounter (HOSPITAL_COMMUNITY): Payer: BC Managed Care – PPO

## 2013-11-01 ENCOUNTER — Encounter (HOSPITAL_COMMUNITY)
Admission: RE | Admit: 2013-11-01 | Discharge: 2013-11-01 | Disposition: A | Discharge status: Home / Self Care | Payer: Self-pay | Source: Ambulatory Visit | Attending: Cardiology | Admitting: Cardiology

## 2013-11-01 DIAGNOSIS — Z5189 Encounter for other specified aftercare: Secondary | ICD-10-CM | POA: Insufficient documentation

## 2013-11-01 DIAGNOSIS — I10 Essential (primary) hypertension: Secondary | ICD-10-CM | POA: Insufficient documentation

## 2013-11-01 DIAGNOSIS — I2582 Chronic total occlusion of coronary artery: Secondary | ICD-10-CM | POA: Insufficient documentation

## 2013-11-01 DIAGNOSIS — I2510 Atherosclerotic heart disease of native coronary artery without angina pectoris: Secondary | ICD-10-CM | POA: Insufficient documentation

## 2013-11-01 DIAGNOSIS — Z9861 Coronary angioplasty status: Secondary | ICD-10-CM | POA: Insufficient documentation

## 2013-11-03 ENCOUNTER — Encounter: Payer: Self-pay | Admitting: Family Medicine

## 2013-11-03 ENCOUNTER — Ambulatory Visit (INDEPENDENT_AMBULATORY_CARE_PROVIDER_SITE_OTHER): Payer: BC Managed Care – PPO | Admitting: Family Medicine

## 2013-11-03 ENCOUNTER — Encounter (HOSPITAL_COMMUNITY)
Admission: RE | Admit: 2013-11-03 | Discharge: 2013-11-03 | Disposition: A | Discharge status: Home / Self Care | Payer: Self-pay | Source: Ambulatory Visit | Attending: Cardiology | Admitting: Cardiology

## 2013-11-03 VITALS — BP 122/88 | HR 60 | Ht 70.0 in | Wt 200.0 lb

## 2013-11-03 DIAGNOSIS — I10 Essential (primary) hypertension: Secondary | ICD-10-CM

## 2013-11-03 DIAGNOSIS — Z0000 Encounter for general adult medical examination without abnormal findings: Secondary | ICD-10-CM

## 2013-11-03 DIAGNOSIS — I2510 Atherosclerotic heart disease of native coronary artery without angina pectoris: Secondary | ICD-10-CM

## 2013-11-03 DIAGNOSIS — G253 Myoclonus: Secondary | ICD-10-CM | POA: Insufficient documentation

## 2013-11-03 DIAGNOSIS — Z79899 Other long term (current) drug therapy: Secondary | ICD-10-CM

## 2013-11-03 DIAGNOSIS — C61 Malignant neoplasm of prostate: Secondary | ICD-10-CM

## 2013-11-03 DIAGNOSIS — Z1211 Encounter for screening for malignant neoplasm of colon: Secondary | ICD-10-CM

## 2013-11-03 DIAGNOSIS — E7800 Pure hypercholesterolemia, unspecified: Secondary | ICD-10-CM

## 2013-11-03 DIAGNOSIS — F411 Generalized anxiety disorder: Secondary | ICD-10-CM

## 2013-11-03 DIAGNOSIS — K219 Gastro-esophageal reflux disease without esophagitis: Secondary | ICD-10-CM

## 2013-11-03 LAB — COMPREHENSIVE METABOLIC PANEL
ALT: 37 U/L (ref 0–53)
AST: 27 U/L (ref 0–37)
Albumin: 4.5 g/dL (ref 3.5–5.2)
Alkaline Phosphatase: 54 U/L (ref 39–117)
BUN: 17 mg/dL (ref 6–23)
CO2: 28 mEq/L (ref 19–32)
Calcium: 9.7 mg/dL (ref 8.4–10.5)
Chloride: 100 mEq/L (ref 96–112)
Creat: 1.05 mg/dL (ref 0.50–1.35)
Glucose, Bld: 98 mg/dL (ref 70–99)
Potassium: 4.7 mEq/L (ref 3.5–5.3)
Total Protein: 7.1 g/dL (ref 6.0–8.3)

## 2013-11-03 LAB — CBC WITH DIFFERENTIAL/PLATELET
Basophils Absolute: 0 10*3/uL (ref 0.0–0.1)
Basophils Relative: 0 % (ref 0–1)
Eosinophils Absolute: 0.1 10*3/uL (ref 0.0–0.7)
Eosinophils Relative: 2 % (ref 0–5)
HCT: 47.1 % (ref 39.0–52.0)
Hemoglobin: 16.6 g/dL (ref 13.0–17.0)
Lymphocytes Relative: 30 % (ref 12–46)
MCH: 32.5 pg (ref 26.0–34.0)
MCHC: 35.2 g/dL (ref 30.0–36.0)
Monocytes Absolute: 0.7 10*3/uL (ref 0.1–1.0)
Monocytes Relative: 11 % (ref 3–12)
Platelets: 210 10*3/uL (ref 150–400)
RBC: 5.1 MIL/uL (ref 4.22–5.81)
RDW: 13.7 % (ref 11.5–15.5)

## 2013-11-03 LAB — POCT URINALYSIS DIPSTICK
Blood, UA: NEGATIVE
Ketones, UA: NEGATIVE
Nitrite, UA: NEGATIVE
Protein, UA: NEGATIVE
Spec Grav, UA: 1.015
Urobilinogen, UA: NEGATIVE

## 2013-11-03 LAB — LIPID PANEL
Cholesterol: 117 mg/dL (ref 0–200)
LDL Cholesterol: 51 mg/dL (ref 0–99)
Total CHOL/HDL Ratio: 2.9 Ratio
VLDL: 25 mg/dL (ref 0–40)

## 2013-11-03 NOTE — Progress Notes (Signed)
Chief Complaint  Patient presents with  . Annual Exam    fasting annual exam. Does have a twitch just about every night in both of his legs wonders if this could be side effect of one of his meds.    [name] is a 62 y.o. male who presents for a complete physical.  He has the following concerns:  He reports some twitching in his legs at night, which will sometimes wake up his wife at night.  He has had it for several years, unchanged/stable.  Rarely wakes him up, like a startle, like he is falling, usually just one "twitch".  CAD: Denies chest pain, DOE--stable for a couple of years. Still doing cardiac rehab. Sees Dr. Anne Fu yearly (in the Spring)  HTN: BP's at home are running 117-120's/70-80 at home and at rehab. Denies headaches or dizziness.  Rarely lightheaded if he stands very quickly.  GERD: Takes over-the-counter Nexium daily with good results. He will have recurrent reflux if he misses a dose. Denies dysphagia   Hyperlipidemia follow-up: Compliant with medications and denies medication side effects. Thinks his diet hasn't been as good--he has gained weight due to change in diet.  Red meat 2x/week, eggs once a week, cheese 4x/week.  He reports that Dr. Anne Fu increased the atorvastatin dose to 40mg  in the spring.  Prostate cancer:  Diagnosed 2008, treated with brachytherapy.  Seen yearly by Dr. Vernie Ammons, due now.  Anxiety:  Doing very well.  Uses xanax very infrequently, maybe once a month.  Immunization History  Administered Date(s) Administered  . Influenza Split 11/03/2011, 10/29/2012  . Influenza,inj,Quad PF,36+ Mos 10/26/2013  . Td 11/26/2005  . Tdap 11/03/2011   Last colonoscopy: 2005  Last PSA: 1 year ago, per Dr. Vernie Ammons Ophtho:2012, got new glasses last year Dentist: twice yearly  Exercise: 4 days/week (3 days cardiac rehab, and walking or paddling once a week)  Past Medical History  Diagnosis Date  . Hyperlipidemia   . Anxiety 05/2010  . Prostate  cancer 01/2008     s/p brachiotherapy; Dr. Vernie Ammons  . CAD (coronary artery disease) 2010    stent to LAD 06/2009; Dr. Anne Fu  . Kidney stone 03/2010  . ED (erectile dysfunction)   . Irregular heartbeat     started on beta blocker by Dr. Anne Fu, improved  . Shingles 2010    Past Surgical History  Procedure Laterality Date  . Heart stent  06/2009    drug-eluting stent LAD  . Inguinal hernia repair  10/1998    left  . Colonoscopy  2005  . Vasectomy    . Radioactive seed implant  01/2008    prostate cancer    History   Social History  . Marital Status: Married    Spouse Name: N/A    Number of Children: 3  . Years of Education: N/A   Occupational History  . manufacturing    Social History Main Topics  . Smoking status: Former Smoker    Types: Cigarettes, Pipe, Software engineer  . Smokeless tobacco: Never Used     Comment: distant tobacco h/o in college (2-3 packs/week); +pipe/cigar use daily x 15 years; quit 20 years ago  . Alcohol Use: Yes     Comment: 4-5 beers per week.  . Drug Use: No  . Sexual Activity: Not on file   Other Topics Concern  . Not on file   Social History Narrative   Married. 2 daughters in [place],  5 grandchildren (locally) and one in [place].  Working part-time, has a Environmental manager).  Still looking for some supplemental part-time work.    Family History  Problem Relation Age of Onset  . Heart disease Father 49  . Cancer Father 33    prostate  . Hypertension Father   . Hyperlipidemia Father   . Cancer Mother     bladder  . Eating disorder Mother   . Hodgkin's lymphoma Daughter   . Cardiomyopathy Daughter     related to treatment for lymphoma  . Cancer Daughter     thyroid  . Heart disease Daughter     congenital ASD, s/p repair  . Cancer Paternal Grandfather     prostate  . Diabetes Neg Hx     Current outpatient prescriptions:ALPRAZolam (XANAX) 0.5 MG tablet, Take 1 tablet (0.5 mg total) by mouth at bedtime as needed., Disp: 30  tablet, Rfl: 0;  aspirin 81 MG tablet, Take 81 mg by mouth daily.  , Disp: , Rfl: ;  atorvastatin (LIPITOR) 40 MG tablet, Take 40 mg by mouth daily., Disp: , Rfl: ;  citalopram (CELEXA) 40 MG tablet, Take 1 tablet (40 mg total) by mouth daily., Disp: 90 tablet, Rfl: 1 esomeprazole (NEXIUM) 20 MG capsule, Take 20 mg by mouth daily at 12 noon., Disp: , Rfl: ;  fish oil-omega-3 fatty acids 1000 MG capsule, Take 1 g by mouth daily.  , Disp: , Rfl: ;  metoprolol succinate (TOPROL-XL) 25 MG 24 hr tablet, Take 1 tablet by mouth Daily., Disp: , Rfl: ;  vardenafil (LEVITRA) 20 MG tablet, Take 20 mg by mouth daily as needed.  , Disp: , Rfl:   Allergies  Allergen Reactions  . Imdur [Isosorbide Mononitrate] Rash   ROS: The patient denies anorexia, fever, weight changes (unchanged from last year, but gained about 15 pounds the year prior which he hasn't lost yet), no headaches, vision loss, decreased hearing, ear pain, hoarseness, chest pain, palpitations, dizziness, syncope, dyspnea on exertion, cough, swelling, nausea, vomiting, diarrhea, constipation, abdominal pain, melena, hematochezia, indigestion/heartburn, hematuria, incontinence, nocturia (1x/night), weakened urine stream, dysuria, genital lesions, joint pains, numbness, tingling, weakness, tremor, suspicious skin lesions, depression, anxiety, abnormal bleeding/bruising, or enlarged lymph nodes.  +erectile dysfunction  PHYSICAL EXAM:  BP 122/88  Pulse 60  Ht 5\' 10"  (1.778 m)  Wt 200 lb (90.719 kg)  BMI 28.70 kg/m2  General Appearance:  Alert, cooperative, no distress, appears stated age.  He has shaved his facial hair since last visit  Head:  Normocephalic, without obvious abnormality, atraumatic   Eyes:  PERRL, conjunctiva/corneas clear, EOM's intact, fundi  benign   Ears:  Normal TM's and external ear canals   Nose:  Nares normal, mucosa normal, no drainage or sinus tenderness   Throat:  Lips, mucosa, and tongue normal; teeth and gums normal    Neck:  Supple, no lymphadenopathy; thyroid: no enlargement/tenderness/nodules; no carotid  bruit or JVD   Back:  Spine nontender, no curvature, ROM normal, no CVA tenderness   Lungs:  Clear to auscultation bilaterally without wheezes, rales or ronchi; respirations unlabored   Chest Wall:  No tenderness or deformity   Heart:  Regular rate and rhythm, S1 and S2 normal, no murmur, rub  or gallop. Occasional ectopic beat   Breast Exam:  No chest wall tenderness, masses or gynecomastia   Abdomen:  Soft, non-tender, nondistended, normoactive bowel sounds,  no masses, no hepatosplenomegaly   Genitalia:  Normal male external genitalia without lesions. Testicles without masses. No inguinal hernias.  Rectal:  Deferred to Urologist.   Extremities:  No clubbing, cyanosis or edema.    Pulses:  2+ and symmetric all extremities   Skin:  Skin color, texture, turgor normal.  Lymph nodes:  Cervical, supraclavicular, and axillary nodes normal   Neurologic:  CNII-XII intact, normal strength, sensation and gait; reflexes 2+ and symmetric throughout          Psych: Normal mood, affect, hygiene and grooming.   ASSESSMENT/PLAN:  Routine general medical examination at a health care facility - Plan: POCT Urinalysis Dipstick, Visual acuity screening, PSA  Pure hypercholesterolemia - Plan: Lipid panel, Comprehensive metabolic panel  Prostate cancer - Plan: PSA  HTN (hypertension) - Plan: Comprehensive metabolic panel  GERD (gastroesophageal reflux disease)  CAD (coronary artery disease) - Plan: CBC with Differential  Anxiety state, unspecified  Colon cancer screening - Plan: Ambulatory referral to Gastroenterology  Myoclonus - infrequent/mild.  educated/counseled.  Do not feel that treatment is indicated at this time.  Check electrolytes.  consider clonazepam if worse  Encounter for long-term (current) use of other medications - Plan: Lipid panel, Comprehensive metabolic panel, CBC with Differential,  Magnesium  Recommended at least 30 minutes of aerobic activity at least 5 days/week; proper sunscreen use reviewed; healthy diet and alcohol recommendations (less than or equal to 2 drinks/day) reviewed; regular seatbelt use; changing batteries in smoke detectors. Self-testicular exams. Immunization recommendations discussed--pneumonia vaccine age 66.  Counseled re: risks/benefits of zostavax.  To check with insurance company and schedule nurse visit.  Colonoscopy recommendations reviewed, due in 2015.  Send copies of results to Dr. Vernie Ammons Cc: labs to Dr. Anne Fu

## 2013-11-03 NOTE — Patient Instructions (Addendum)
Myoclonus Myoclonus is a term that refers to brief, involuntary twitching or jerking of a muscle or a group of muscles. It describes a symptom, and generally, is not a diagnosis of a disease. The myoclonic twitches or jerks are usually caused by sudden muscle contractions. They also can result from brief lapses of contraction. Myoclonic twitches or jerks may occur:  Alone or in sequence.  In a pattern or without pattern.  Infrequently or many times each minute. Often times, myoclonus is one of several symptoms in a wide variety of nervous system disorders such as:  Multiple sclerosis.  Parkinson's disease.  Alzheimer's disease.  Creutzfeldt-Jakob disease. Familiar examples of normal myoclonus include:  Hiccups and jerks.  "Sleep starts" that some people have while drifting off to sleep. Severe cases can severely limit a person's ability to:  Eat.  Talk.  Walk. Myoclonic jerks commonly occur in individuals with epilepsy. The most common types of myoclonus include:  Action.  Cortical reflex.  Essential.  Palatal.  Progressive myoclonus epilepsy.  Reticular reflex.  Sleep.  Stimulus-sensitive. TREATMENT  Treatment for myoclonus consists of medicines that may help reduce symptoms. These drugs (many of which are also used to treat epilepsy) include:   Barbiturates.  Clonazepam.  Phenytoin.  Primidone.  Sodium valproate. The complex origins of myoclonus may require the use of multiple drugs. Document Released: 12/05/2002 Document Revised: 03/08/2012 Document Reviewed: 12/15/2005 [facility] Patient Information 2014 [place], [place].   HEALTH MAINTENANCE RECOMMENDATIONS:  It is recommended that you get at least 30 minutes of aerobic exercise at least 5 days/week (for weight loss, you may need as much as 60-90 minutes). This can be any activity that gets your heart rate up. This can be divided in 10-15 minute intervals if needed, but try and build up your  endurance at least once a week.  Weight bearing exercise is also recommended twice weekly.  Eat a healthy diet with lots of vegetables, fruits and fiber.  "Colorful" foods have a lot of vitamins (ie green vegetables, tomatoes, red peppers, etc).  Limit sweet tea, regular sodas and alcoholic beverages, all of which has a lot of calories and sugar.  Up to 2 alcoholic drinks daily may be beneficial for men (unless trying to lose weight, watch sugars).  Drink a lot of water.  Sunscreen of at least SPF 30 should be used on all sun-exposed parts of the skin when outside between the hours of 10 am and 4 pm (not just when at beach or pool, but even with exercise, golf, tennis, and yard work!)  Use a sunscreen that says "broad spectrum" so it covers both UVA and UVB rays, and make sure to reapply every 1-2 hours.  Remember to change the batteries in your smoke detectors when changing your clock times in the spring and fall.  Use your seat belt every time you are in a car, and please drive safely and not be distracted with cell phones and texting while driving.  Check with your insurance company re: coverage of shingles vaccine (zostavax).  Schedule nurse visit if desired.  Must be after 11/29 (a month from your flu shot).

## 2013-11-04 ENCOUNTER — Encounter (HOSPITAL_COMMUNITY)
Admission: RE | Admit: 2013-11-04 | Discharge: 2013-11-04 | Disposition: A | Discharge status: Home / Self Care | Payer: Self-pay | Source: Ambulatory Visit | Attending: Cardiology | Admitting: Cardiology

## 2013-11-08 ENCOUNTER — Encounter (HOSPITAL_COMMUNITY)
Admission: RE | Admit: 2013-11-08 | Discharge: 2013-11-08 | Disposition: A | Discharge status: Home / Self Care | Payer: Self-pay | Source: Ambulatory Visit | Attending: Cardiology | Admitting: Cardiology

## 2013-11-10 ENCOUNTER — Encounter (HOSPITAL_COMMUNITY)
Admission: RE | Admit: 2013-11-10 | Discharge: 2013-11-10 | Disposition: A | Discharge status: Home / Self Care | Payer: Self-pay | Source: Ambulatory Visit | Attending: Cardiology | Admitting: Cardiology

## 2013-11-11 ENCOUNTER — Encounter (HOSPITAL_COMMUNITY)
Admission: RE | Admit: 2013-11-11 | Discharge: 2013-11-11 | Disposition: A | Discharge status: Home / Self Care | Payer: Self-pay | Source: Ambulatory Visit | Attending: Cardiology | Admitting: Cardiology

## 2013-11-15 ENCOUNTER — Encounter (HOSPITAL_COMMUNITY)
Admission: RE | Admit: 2013-11-15 | Discharge: 2013-11-15 | Disposition: A | Discharge status: Home / Self Care | Payer: Self-pay | Source: Ambulatory Visit | Attending: Cardiology | Admitting: Cardiology

## 2013-11-17 ENCOUNTER — Encounter (HOSPITAL_COMMUNITY): Payer: Self-pay

## 2013-11-18 ENCOUNTER — Encounter (HOSPITAL_COMMUNITY)
Admission: RE | Admit: 2013-11-18 | Discharge: 2013-11-18 | Disposition: A | Discharge status: Home / Self Care | Payer: Self-pay | Source: Ambulatory Visit | Attending: Cardiology | Admitting: Cardiology

## 2013-11-22 ENCOUNTER — Encounter (HOSPITAL_COMMUNITY)
Admission: RE | Admit: 2013-11-22 | Discharge: 2013-11-22 | Disposition: A | Discharge status: Home / Self Care | Payer: Self-pay | Source: Ambulatory Visit | Attending: Cardiology | Admitting: Cardiology

## 2013-11-23 ENCOUNTER — Encounter (HOSPITAL_COMMUNITY)
Admission: RE | Admit: 2013-11-23 | Discharge: 2013-11-23 | Disposition: A | Discharge status: Home / Self Care | Payer: Self-pay | Source: Ambulatory Visit | Attending: Cardiology | Admitting: Cardiology

## 2013-11-29 ENCOUNTER — Encounter (HOSPITAL_COMMUNITY)
Admission: RE | Admit: 2013-11-29 | Discharge: 2013-11-29 | Disposition: A | Discharge status: Home / Self Care | Payer: Self-pay | Source: Ambulatory Visit | Attending: Cardiology | Admitting: Cardiology

## 2013-11-29 DIAGNOSIS — Z5189 Encounter for other specified aftercare: Secondary | ICD-10-CM | POA: Insufficient documentation

## 2013-11-29 DIAGNOSIS — Z9861 Coronary angioplasty status: Secondary | ICD-10-CM | POA: Insufficient documentation

## 2013-11-29 DIAGNOSIS — I10 Essential (primary) hypertension: Secondary | ICD-10-CM | POA: Insufficient documentation

## 2013-11-29 DIAGNOSIS — I2582 Chronic total occlusion of coronary artery: Secondary | ICD-10-CM | POA: Insufficient documentation

## 2013-11-29 DIAGNOSIS — I2510 Atherosclerotic heart disease of native coronary artery without angina pectoris: Secondary | ICD-10-CM | POA: Insufficient documentation

## 2013-12-01 ENCOUNTER — Encounter (HOSPITAL_COMMUNITY): Payer: Self-pay

## 2013-12-01 ENCOUNTER — Encounter (HOSPITAL_COMMUNITY)
Admission: RE | Admit: 2013-12-01 | Discharge: 2013-12-01 | Disposition: A | Discharge status: Home / Self Care | Payer: Self-pay | Source: Ambulatory Visit | Attending: Cardiology | Admitting: Cardiology

## 2013-12-02 ENCOUNTER — Encounter (HOSPITAL_COMMUNITY): Payer: Self-pay

## 2013-12-06 ENCOUNTER — Encounter (HOSPITAL_COMMUNITY)
Admission: RE | Admit: 2013-12-06 | Discharge: 2013-12-06 | Disposition: A | Discharge status: Home / Self Care | Payer: Self-pay | Source: Ambulatory Visit | Attending: Cardiology | Admitting: Cardiology

## 2013-12-06 ENCOUNTER — Encounter (HOSPITAL_COMMUNITY): Payer: Self-pay

## 2013-12-08 ENCOUNTER — Encounter (HOSPITAL_COMMUNITY)
Admission: RE | Admit: 2013-12-08 | Discharge: 2013-12-08 | Disposition: A | Discharge status: Home / Self Care | Payer: Self-pay | Source: Ambulatory Visit | Attending: Cardiology | Admitting: Cardiology

## 2013-12-08 ENCOUNTER — Encounter (HOSPITAL_COMMUNITY): Payer: Self-pay

## 2013-12-09 ENCOUNTER — Encounter (HOSPITAL_COMMUNITY)
Admission: RE | Admit: 2013-12-09 | Discharge: 2013-12-09 | Disposition: A | Discharge status: Home / Self Care | Payer: Self-pay | Source: Ambulatory Visit | Attending: Cardiology | Admitting: Cardiology

## 2013-12-09 ENCOUNTER — Encounter (HOSPITAL_COMMUNITY): Payer: Self-pay

## 2013-12-12 ENCOUNTER — Other Ambulatory Visit: Payer: Self-pay | Admitting: Cardiology

## 2013-12-13 ENCOUNTER — Encounter (HOSPITAL_COMMUNITY): Payer: Self-pay

## 2013-12-15 ENCOUNTER — Other Ambulatory Visit: Payer: Self-pay | Admitting: *Deleted

## 2013-12-15 ENCOUNTER — Encounter (HOSPITAL_COMMUNITY)
Admission: RE | Admit: 2013-12-15 | Discharge: 2013-12-15 | Disposition: A | Discharge status: Home / Self Care | Payer: Self-pay | Source: Ambulatory Visit | Attending: Cardiology | Admitting: Cardiology

## 2013-12-15 ENCOUNTER — Encounter (HOSPITAL_COMMUNITY): Payer: Self-pay

## 2013-12-15 MED ORDER — METOPROLOL SUCCINATE ER 25 MG PO TB24: 25.0000 mg | ORAL_TABLET | Freq: Every day | ORAL | Status: DC

## 2013-12-16 ENCOUNTER — Encounter (HOSPITAL_COMMUNITY)
Admission: RE | Admit: 2013-12-16 | Discharge: 2013-12-16 | Disposition: A | Discharge status: Home / Self Care | Payer: Self-pay | Source: Ambulatory Visit | Attending: Cardiology | Admitting: Cardiology

## 2013-12-16 ENCOUNTER — Encounter (HOSPITAL_COMMUNITY): Payer: Self-pay

## 2013-12-19 ENCOUNTER — Telehealth: Payer: Self-pay | Admitting: Internal Medicine

## 2013-12-19 DIAGNOSIS — F411 Generalized anxiety disorder: Secondary | ICD-10-CM

## 2013-12-19 MED ORDER — CITALOPRAM HYDROBROMIDE 40 MG PO TABS: 40.0000 mg | ORAL_TABLET | Freq: Every day | ORAL | Status: DC

## 2013-12-19 NOTE — Telephone Encounter (Signed)
Refill request for citalopram 40mg  to [facility]

## 2013-12-19 NOTE — Telephone Encounter (Signed)
done

## 2013-12-20 ENCOUNTER — Encounter (HOSPITAL_COMMUNITY): Payer: Self-pay

## 2013-12-27 ENCOUNTER — Encounter (HOSPITAL_COMMUNITY)
Admission: RE | Admit: 2013-12-27 | Discharge: 2013-12-27 | Disposition: A | Discharge status: Home / Self Care | Payer: Self-pay | Source: Ambulatory Visit | Attending: Cardiology | Admitting: Cardiology

## 2013-12-27 ENCOUNTER — Encounter (HOSPITAL_COMMUNITY): Payer: Self-pay

## 2013-12-30 ENCOUNTER — Encounter (HOSPITAL_COMMUNITY)
Admission: RE | Admit: 2013-12-30 | Discharge: 2013-12-30 | Disposition: A | Discharge status: Home / Self Care | Payer: Self-pay | Source: Ambulatory Visit | Attending: Cardiology | Admitting: Cardiology

## 2013-12-30 ENCOUNTER — Encounter (HOSPITAL_COMMUNITY): Payer: Self-pay

## 2013-12-30 DIAGNOSIS — Z9861 Coronary angioplasty status: Secondary | ICD-10-CM | POA: Insufficient documentation

## 2013-12-30 DIAGNOSIS — Z5189 Encounter for other specified aftercare: Secondary | ICD-10-CM | POA: Insufficient documentation

## 2013-12-30 DIAGNOSIS — I2582 Chronic total occlusion of coronary artery: Secondary | ICD-10-CM | POA: Insufficient documentation

## 2013-12-30 DIAGNOSIS — I2510 Atherosclerotic heart disease of native coronary artery without angina pectoris: Secondary | ICD-10-CM | POA: Insufficient documentation

## 2013-12-30 DIAGNOSIS — I10 Essential (primary) hypertension: Secondary | ICD-10-CM | POA: Insufficient documentation

## 2014-01-03 ENCOUNTER — Encounter (HOSPITAL_COMMUNITY)
Admission: RE | Admit: 2014-01-03 | Discharge: 2014-01-03 | Disposition: A | Discharge status: Home / Self Care | Payer: Self-pay | Source: Ambulatory Visit | Attending: Cardiology | Admitting: Cardiology

## 2014-01-03 ENCOUNTER — Encounter (HOSPITAL_COMMUNITY): Payer: Self-pay

## 2014-01-05 ENCOUNTER — Encounter (HOSPITAL_COMMUNITY)
Admission: RE | Admit: 2014-01-05 | Discharge: 2014-01-05 | Disposition: A | Discharge status: Home / Self Care | Payer: Self-pay | Source: Ambulatory Visit | Attending: Cardiology | Admitting: Cardiology

## 2014-01-05 ENCOUNTER — Encounter (HOSPITAL_COMMUNITY): Payer: Self-pay

## 2014-01-06 ENCOUNTER — Encounter (HOSPITAL_COMMUNITY)
Admission: RE | Admit: 2014-01-06 | Discharge: 2014-01-06 | Disposition: A | Discharge status: Home / Self Care | Payer: Self-pay | Source: Ambulatory Visit | Attending: Cardiology | Admitting: Cardiology

## 2014-01-06 ENCOUNTER — Encounter (HOSPITAL_COMMUNITY): Payer: Self-pay

## 2014-01-10 ENCOUNTER — Encounter (HOSPITAL_COMMUNITY)
Admission: RE | Admit: 2014-01-10 | Discharge: 2014-01-10 | Disposition: A | Discharge status: Home / Self Care | Payer: Self-pay | Source: Ambulatory Visit | Attending: Cardiology | Admitting: Cardiology

## 2014-01-10 ENCOUNTER — Encounter (HOSPITAL_COMMUNITY): Payer: Self-pay

## 2014-01-12 ENCOUNTER — Encounter (HOSPITAL_COMMUNITY): Payer: Self-pay

## 2014-01-13 ENCOUNTER — Encounter (HOSPITAL_COMMUNITY)
Admission: RE | Admit: 2014-01-13 | Discharge: 2014-01-13 | Disposition: A | Discharge status: Home / Self Care | Payer: Self-pay | Source: Ambulatory Visit | Attending: Cardiology | Admitting: Cardiology

## 2014-01-13 ENCOUNTER — Encounter (HOSPITAL_COMMUNITY): Payer: Self-pay

## 2014-01-17 ENCOUNTER — Encounter (HOSPITAL_COMMUNITY): Payer: Self-pay

## 2014-01-17 ENCOUNTER — Encounter (HOSPITAL_COMMUNITY)
Admission: RE | Admit: 2014-01-17 | Discharge: 2014-01-17 | Disposition: A | Discharge status: Home / Self Care | Payer: Self-pay | Source: Ambulatory Visit | Attending: Cardiology | Admitting: Cardiology

## 2014-01-19 ENCOUNTER — Encounter (HOSPITAL_COMMUNITY): Payer: Self-pay

## 2014-01-19 ENCOUNTER — Encounter (HOSPITAL_COMMUNITY)
Admission: RE | Admit: 2014-01-19 | Discharge: 2014-01-19 | Disposition: A | Discharge status: Home / Self Care | Payer: Self-pay | Source: Ambulatory Visit | Attending: Cardiology | Admitting: Cardiology

## 2014-01-20 ENCOUNTER — Encounter (HOSPITAL_COMMUNITY)
Admission: RE | Admit: 2014-01-20 | Discharge: 2014-01-20 | Disposition: A | Discharge status: Home / Self Care | Payer: Self-pay | Source: Ambulatory Visit | Attending: Cardiology | Admitting: Cardiology

## 2014-01-20 ENCOUNTER — Encounter (HOSPITAL_COMMUNITY): Payer: Self-pay

## 2014-01-24 ENCOUNTER — Encounter (HOSPITAL_COMMUNITY): Payer: Self-pay

## 2014-01-24 ENCOUNTER — Encounter (HOSPITAL_COMMUNITY)
Admission: RE | Admit: 2014-01-24 | Discharge: 2014-01-24 | Disposition: A | Discharge status: Home / Self Care | Payer: Self-pay | Source: Ambulatory Visit | Attending: Cardiology | Admitting: Cardiology

## 2014-01-26 ENCOUNTER — Encounter (HOSPITAL_COMMUNITY)
Admission: RE | Admit: 2014-01-26 | Discharge: 2014-01-26 | Disposition: A | Discharge status: Home / Self Care | Payer: Self-pay | Source: Ambulatory Visit | Attending: Cardiology | Admitting: Cardiology

## 2014-01-26 ENCOUNTER — Encounter (HOSPITAL_COMMUNITY): Payer: Self-pay

## 2014-01-27 ENCOUNTER — Encounter (HOSPITAL_COMMUNITY)
Admission: RE | Admit: 2014-01-27 | Discharge: 2014-01-27 | Disposition: A | Discharge status: Home / Self Care | Payer: Self-pay | Source: Ambulatory Visit | Attending: Cardiology | Admitting: Cardiology

## 2014-01-27 ENCOUNTER — Encounter (HOSPITAL_COMMUNITY): Payer: Self-pay

## 2014-01-27 ENCOUNTER — Encounter: Payer: Self-pay | Admitting: Family Medicine

## 2014-01-31 ENCOUNTER — Encounter (HOSPITAL_COMMUNITY)
Admission: RE | Admit: 2014-01-31 | Discharge: 2014-01-31 | Disposition: A | Discharge status: Home / Self Care | Payer: Self-pay | Source: Ambulatory Visit | Attending: Cardiology | Admitting: Cardiology

## 2014-01-31 ENCOUNTER — Encounter (HOSPITAL_COMMUNITY): Payer: Self-pay

## 2014-01-31 DIAGNOSIS — I2510 Atherosclerotic heart disease of native coronary artery without angina pectoris: Secondary | ICD-10-CM | POA: Insufficient documentation

## 2014-01-31 DIAGNOSIS — I2582 Chronic total occlusion of coronary artery: Secondary | ICD-10-CM | POA: Insufficient documentation

## 2014-01-31 DIAGNOSIS — I10 Essential (primary) hypertension: Secondary | ICD-10-CM | POA: Insufficient documentation

## 2014-01-31 DIAGNOSIS — Z9861 Coronary angioplasty status: Secondary | ICD-10-CM | POA: Insufficient documentation

## 2014-01-31 DIAGNOSIS — Z5189 Encounter for other specified aftercare: Secondary | ICD-10-CM | POA: Insufficient documentation

## 2014-02-02 ENCOUNTER — Encounter (HOSPITAL_COMMUNITY)
Admission: RE | Admit: 2014-02-02 | Discharge: 2014-02-02 | Disposition: A | Discharge status: Home / Self Care | Payer: Self-pay | Source: Ambulatory Visit | Attending: Cardiology | Admitting: Cardiology

## 2014-02-02 ENCOUNTER — Encounter (HOSPITAL_COMMUNITY): Payer: Self-pay

## 2014-02-03 ENCOUNTER — Encounter (HOSPITAL_COMMUNITY)
Admission: RE | Admit: 2014-02-03 | Discharge: 2014-02-03 | Disposition: A | Discharge status: Home / Self Care | Payer: Self-pay | Source: Ambulatory Visit | Attending: Cardiology | Admitting: Cardiology

## 2014-02-03 ENCOUNTER — Encounter (HOSPITAL_COMMUNITY): Payer: Self-pay

## 2014-02-07 ENCOUNTER — Encounter (HOSPITAL_COMMUNITY): Payer: Self-pay

## 2014-02-07 ENCOUNTER — Encounter (HOSPITAL_COMMUNITY)
Admission: RE | Admit: 2014-02-07 | Discharge: 2014-02-07 | Disposition: A | Discharge status: Home / Self Care | Payer: Self-pay | Source: Ambulatory Visit | Attending: Cardiology | Admitting: Cardiology

## 2014-02-09 ENCOUNTER — Encounter (HOSPITAL_COMMUNITY): Payer: Self-pay

## 2014-02-10 ENCOUNTER — Encounter (HOSPITAL_COMMUNITY): Payer: Self-pay

## 2014-02-10 ENCOUNTER — Encounter (HOSPITAL_COMMUNITY)
Admission: RE | Admit: 2014-02-10 | Discharge: 2014-02-10 | Disposition: A | Discharge status: Home / Self Care | Payer: Self-pay | Source: Ambulatory Visit | Attending: Cardiology | Admitting: Cardiology

## 2014-02-14 ENCOUNTER — Encounter (HOSPITAL_COMMUNITY): Payer: Self-pay

## 2014-02-16 ENCOUNTER — Encounter (HOSPITAL_COMMUNITY): Payer: Self-pay

## 2014-02-16 ENCOUNTER — Encounter (HOSPITAL_COMMUNITY)
Admission: RE | Admit: 2014-02-16 | Discharge: 2014-02-16 | Disposition: A | Discharge status: Home / Self Care | Payer: Self-pay | Source: Ambulatory Visit | Attending: Cardiology | Admitting: Cardiology

## 2014-02-17 ENCOUNTER — Encounter (HOSPITAL_COMMUNITY): Payer: Self-pay

## 2014-02-21 ENCOUNTER — Encounter (HOSPITAL_COMMUNITY)
Admission: RE | Admit: 2014-02-21 | Discharge: 2014-02-21 | Disposition: A | Discharge status: Home / Self Care | Payer: Self-pay | Source: Ambulatory Visit | Attending: Cardiology | Admitting: Cardiology

## 2014-02-21 ENCOUNTER — Encounter (HOSPITAL_COMMUNITY): Payer: Self-pay

## 2014-02-23 ENCOUNTER — Encounter (HOSPITAL_COMMUNITY): Payer: Self-pay

## 2014-02-24 ENCOUNTER — Encounter (HOSPITAL_COMMUNITY): Payer: Self-pay

## 2014-02-28 ENCOUNTER — Encounter (HOSPITAL_COMMUNITY): Payer: Self-pay

## 2014-02-28 ENCOUNTER — Encounter (HOSPITAL_COMMUNITY): Payer: Self-pay | Attending: Cardiology

## 2014-02-28 ENCOUNTER — Other Ambulatory Visit (INDEPENDENT_AMBULATORY_CARE_PROVIDER_SITE_OTHER): Payer: BC Managed Care – PPO

## 2014-02-28 DIAGNOSIS — I2582 Chronic total occlusion of coronary artery: Secondary | ICD-10-CM | POA: Insufficient documentation

## 2014-02-28 DIAGNOSIS — Z9861 Coronary angioplasty status: Secondary | ICD-10-CM | POA: Insufficient documentation

## 2014-02-28 DIAGNOSIS — I10 Essential (primary) hypertension: Secondary | ICD-10-CM | POA: Insufficient documentation

## 2014-02-28 DIAGNOSIS — I2510 Atherosclerotic heart disease of native coronary artery without angina pectoris: Secondary | ICD-10-CM | POA: Insufficient documentation

## 2014-02-28 DIAGNOSIS — Z2911 Encounter for prophylactic immunotherapy for respiratory syncytial virus (RSV): Secondary | ICD-10-CM

## 2014-02-28 DIAGNOSIS — Z23 Encounter for immunization: Secondary | ICD-10-CM

## 2014-02-28 DIAGNOSIS — Z5189 Encounter for other specified aftercare: Secondary | ICD-10-CM | POA: Insufficient documentation

## 2014-03-02 ENCOUNTER — Encounter (HOSPITAL_COMMUNITY): Payer: Self-pay

## 2014-03-03 ENCOUNTER — Encounter (HOSPITAL_COMMUNITY): Payer: Self-pay

## 2014-03-07 ENCOUNTER — Encounter (HOSPITAL_COMMUNITY): Payer: Self-pay

## 2014-03-09 ENCOUNTER — Encounter (HOSPITAL_COMMUNITY): Payer: Self-pay

## 2014-03-10 ENCOUNTER — Encounter (HOSPITAL_COMMUNITY): Payer: Self-pay

## 2014-03-14 ENCOUNTER — Encounter (HOSPITAL_COMMUNITY): Payer: Self-pay

## 2014-03-15 ENCOUNTER — Encounter: Payer: BC Managed Care – PPO | Admitting: Family Medicine

## 2014-03-16 ENCOUNTER — Encounter (HOSPITAL_COMMUNITY): Payer: Self-pay

## 2014-03-17 ENCOUNTER — Encounter (HOSPITAL_COMMUNITY): Payer: Self-pay

## 2014-03-21 ENCOUNTER — Encounter (HOSPITAL_COMMUNITY): Payer: Self-pay

## 2014-03-23 ENCOUNTER — Encounter (HOSPITAL_COMMUNITY): Payer: Self-pay

## 2014-03-24 ENCOUNTER — Encounter (HOSPITAL_COMMUNITY): Payer: Self-pay

## 2014-03-28 ENCOUNTER — Encounter (HOSPITAL_COMMUNITY): Payer: Self-pay

## 2014-03-30 ENCOUNTER — Encounter (HOSPITAL_COMMUNITY): Payer: Self-pay

## 2014-03-31 ENCOUNTER — Encounter (HOSPITAL_COMMUNITY): Payer: Self-pay

## 2014-04-04 ENCOUNTER — Encounter (HOSPITAL_COMMUNITY): Payer: Self-pay

## 2014-04-06 ENCOUNTER — Encounter: Payer: Self-pay | Admitting: Cardiology

## 2014-04-06 ENCOUNTER — Encounter (HOSPITAL_COMMUNITY): Payer: Self-pay

## 2014-04-07 ENCOUNTER — Encounter (HOSPITAL_COMMUNITY): Payer: Self-pay

## 2014-04-11 ENCOUNTER — Encounter (HOSPITAL_COMMUNITY): Payer: Self-pay

## 2014-04-13 ENCOUNTER — Encounter (HOSPITAL_COMMUNITY): Payer: Self-pay

## 2014-04-14 ENCOUNTER — Ambulatory Visit: Payer: BC Managed Care – PPO | Admitting: Cardiology

## 2014-04-14 ENCOUNTER — Encounter (HOSPITAL_COMMUNITY): Payer: Self-pay

## 2014-04-18 ENCOUNTER — Encounter (HOSPITAL_COMMUNITY): Payer: Self-pay

## 2014-04-20 ENCOUNTER — Encounter (HOSPITAL_COMMUNITY): Payer: Self-pay

## 2014-04-21 ENCOUNTER — Encounter (HOSPITAL_COMMUNITY): Payer: Self-pay

## 2014-04-25 ENCOUNTER — Encounter (HOSPITAL_COMMUNITY): Payer: Self-pay

## 2014-04-26 ENCOUNTER — Ambulatory Visit (INDEPENDENT_AMBULATORY_CARE_PROVIDER_SITE_OTHER): Payer: BC Managed Care – PPO | Admitting: Cardiology

## 2014-04-26 ENCOUNTER — Encounter: Payer: Self-pay | Admitting: Cardiology

## 2014-04-26 VITALS — BP 142/78 | HR 55 | Ht 70.0 in | Wt 201.8 lb

## 2014-04-26 DIAGNOSIS — I2510 Atherosclerotic heart disease of native coronary artery without angina pectoris: Secondary | ICD-10-CM

## 2014-04-26 DIAGNOSIS — R079 Chest pain, unspecified: Secondary | ICD-10-CM

## 2014-04-26 DIAGNOSIS — E7800 Pure hypercholesterolemia, unspecified: Secondary | ICD-10-CM

## 2014-04-26 DIAGNOSIS — I10 Essential (primary) hypertension: Secondary | ICD-10-CM

## 2014-04-26 NOTE — Progress Notes (Signed)
[facility]. [address], [address], [place]  [address] Phone: [phone] Fax:  [phone]  Date:  04/26/2014   ID:  [name], DOB [birth year], MRN [id number]  PCP:  Vikki Ports, MD   History of Present Illness: [name] is a 63 y.o. male with coronary artery disease status post drug eluting stent to LAD in July of 2010 (following abnormal nuclear stress test), hyperlipidemia, hypertension, anxiety here for annual month followup.   After walking for 10 min. He is feeling chest pain. SSCP, no radiation, mild SOB, no nausea. Less intense than before in 2010.   At a prior visit he was describing low blood pressures and his lisinopril and metoprolol (subsequently stopped lisinopril). No longer on Plavix.   He also has a residual LAD lesion medical management. He also developed a rash on Imdur. Prednisone helped and stopped Imdur. Tried Imdur again and developed rash. No recent chest pain. No longer daytime fatigue. He denies any excessive snoring.   Palpitations have waned. Used to notice at night in bed. No syncope. Some lightheadedness when bending over and getting up    Wt Readings from Last 3 Encounters:  04/26/14 201 lb 12.8 oz (91.536 kg)  11/03/13 200 lb (90.719 kg)  12/02/12 200 lb (90.719 kg)     Past Medical History  Diagnosis Date  . Hyperlipidemia   . Anxiety 05/2010  . Prostate cancer 01/2008     s/p brachiotherapy; Dr. Karsten Ro  . CAD (coronary artery disease) 2010    stent to LAD 06/2009; Dr. Marlou Porch  . Kidney stone 03/2010  . ED (erectile dysfunction)   . Irregular heartbeat     started on beta blocker by Dr. Marlou Porch, improved  . Shingles 2010    Past Surgical History  Procedure Laterality Date  . Heart stent  06/2009    drug-eluting stent LAD  . Inguinal hernia repair  10/1998    left  . Colonoscopy  2005  . Vasectomy    . Radioactive seed implant  01/2008    prostate cancer    Current Outpatient Prescriptions  Medication Sig  Dispense Refill  . ALPRAZolam (XANAX) 0.5 MG tablet Take 1 tablet (0.5 mg total) by mouth at bedtime as needed.  30 tablet  0  . aspirin 81 MG tablet Take 81 mg by mouth daily.        Marland Kitchen atorvastatin (LIPITOR) 40 MG tablet Take 40 mg by mouth daily.      . citalopram (CELEXA) 40 MG tablet Take 1 tablet (40 mg total) by mouth daily.  90 tablet  3  . esomeprazole (NEXIUM) 20 MG capsule Take 20 mg by mouth daily at 12 noon.      . fish oil-omega-3 fatty acids 1000 MG capsule Take 1 g by mouth daily.        . metoprolol succinate (TOPROL-XL) 25 MG 24 hr tablet TAKE 1 TABLET BY MOUTH ONCE DAILY  30 tablet  12  . vardenafil (LEVITRA) 20 MG tablet Take 20 mg by mouth daily as needed.         No current facility-administered medications for this visit.    Allergies:    Allergies  Allergen Reactions  . Imdur [Isosorbide Mononitrate] Rash    Social History:  The patient  reports that he has quit smoking. His smoking use included Cigarettes, Pipe, and Cigars. He smoked 0.00 packs per day. He has never used smokeless tobacco. He reports that he  drinks alcohol. He reports that he does not use illicit drugs.   Family History  Problem Relation Age of Onset  . Heart disease Father 93  . Cancer Father 59    prostate  . Hypertension Father   . Hyperlipidemia Father   . Cancer Mother     bladder  . Eating disorder Mother   . Hodgkin's lymphoma Daughter   . Cardiomyopathy Daughter     related to treatment for lymphoma  . Cancer Daughter     thyroid  . Heart disease Daughter     congenital ASD, s/p repair  . Cancer Paternal Grandfather     prostate  . Diabetes Neg Hx     ROS:  Please see the history of present illness.   Denies any fevers, chills, orthopnea, PND  Some numbness in right hand.  All other systems reviewed and negative.   PHYSICAL EXAM: VS:  BP 142/78  Pulse 55  Ht 5\' 10"  (1.778 m)  Wt 201 lb 12.8 oz (91.536 kg)  BMI 28.96 kg/m2 Well nourished, well developed, in no acute  distress HEENT: normal, [place]/AT, EOMI Neck: no JVD, normal carotid upstroke, no bruit Cardiac:  normal S1, S2; RRR; no murmur Lungs:  clear to auscultation bilaterally, no wheezing, rhonchi or rales Abd: soft, nontender, no hepatomegaly, no bruits Ext: no edema, 2+ distal pulses Skin: warm and dry GU: deferred Neuro: no focal abnormalities noted, AAO x 3  EKG:  Sinus bradycardia rate 55 with no other abnormalities    LABS: LDL 51  ASSESSMENT AND PLAN:  1. Coronary artery disease-LAD stent. Aspirin. Having chest pain for the first time with exertion substernal less intensity than 2010. We'll check a nuclear stress test in to evaluate for ischemia. 2010 stent was placed. He does have a residual LAD lesion. Hold metoprolol prior to stress test. Secondary prevention. 2. Hypertension-blood pressure mildly elevated today. Optimally would like systolic less than 277. We will contemplate adding low-dose amlodipine 5 mg as an antianginal medication. 3. Hyperlipidemia-excellent LDL 51, less than 70 at goal. Atorvastatin. 4. We will see back in 2 months. Monitor symptoms closely. If nuclear stress test is abnormal, we will proceed with cardiac catheterization.  Signed, Candee Furbish, MD [facility]  04/26/2014 9:16 AM

## 2014-04-26 NOTE — Patient Instructions (Signed)
Your physician recommends that you continue on your current medications as directed. Please refer to the Current Medication list given to you today.  Your physician has requested that you have en exercise stress myoview. For further information please visit [email or web address]. Please follow instruction sheet, as given.  Your physician recommends that you schedule a follow-up appointment in: 2 months with Dr. Marlou Porch.

## 2014-04-27 ENCOUNTER — Encounter (HOSPITAL_COMMUNITY): Payer: Self-pay

## 2014-04-28 ENCOUNTER — Encounter (HOSPITAL_COMMUNITY): Payer: Self-pay

## 2014-05-02 ENCOUNTER — Encounter (HOSPITAL_COMMUNITY): Payer: Self-pay

## 2014-05-04 ENCOUNTER — Encounter (HOSPITAL_COMMUNITY): Payer: Self-pay

## 2014-05-05 ENCOUNTER — Encounter (HOSPITAL_COMMUNITY): Payer: Self-pay

## 2014-05-09 ENCOUNTER — Encounter (HOSPITAL_COMMUNITY): Payer: Self-pay

## 2014-05-10 ENCOUNTER — Other Ambulatory Visit: Payer: Self-pay

## 2014-05-10 MED ORDER — ATORVASTATIN CALCIUM 40 MG PO TABS: 40.0000 mg | ORAL_TABLET | Freq: Every day | ORAL | Status: DC

## 2014-05-11 ENCOUNTER — Encounter (HOSPITAL_COMMUNITY): Payer: Self-pay

## 2014-05-11 ENCOUNTER — Ambulatory Visit (HOSPITAL_COMMUNITY): Payer: BC Managed Care – PPO | Attending: Cardiovascular Disease | Admitting: Radiology

## 2014-05-11 VITALS — BP 128/84 | Ht 70.0 in | Wt 197.0 lb

## 2014-05-11 DIAGNOSIS — R079 Chest pain, unspecified: Secondary | ICD-10-CM | POA: Insufficient documentation

## 2014-05-11 DIAGNOSIS — I2510 Atherosclerotic heart disease of native coronary artery without angina pectoris: Secondary | ICD-10-CM | POA: Insufficient documentation

## 2014-05-11 DIAGNOSIS — R0602 Shortness of breath: Secondary | ICD-10-CM

## 2014-05-11 MED ORDER — TECHNETIUM TC 99M SESTAMIBI GENERIC - CARDIOLITE
33.0000 | Freq: Once | INTRAVENOUS | Status: AC | PRN
  Administered 2014-05-11: 33 via INTRAVENOUS

## 2014-05-11 MED ORDER — TECHNETIUM TC 99M SESTAMIBI GENERIC - CARDIOLITE
11.0000 | Freq: Once | INTRAVENOUS | Status: AC | PRN
  Administered 2014-05-11: 11 via INTRAVENOUS

## 2014-05-11 NOTE — Progress Notes (Signed)
[facility] SITE 3 NUCLEAR MED [address] [place], [place] [address] [phone]    Cardiology Nuclear Med Study  [name] is a 63 y.o. male     MRN : [id number]     DOB: 11-24-1951  Procedure Date: 05/11/2014  Nuclear Med Background Indication for Stress Test:  Evaluation for Ischemia and Stent Patency History:  History CAD-MI, Stent, 2010 Abnormal MPI Cardiac Risk Factors: Family History - CAD, History of Smoking, Hypertension and Lipids  Symptoms:  Chest Pain with Exertion (last date of chest discomfort 2 weeks ago) and SOB   Nuclear Pre-Procedure Caffeine/Decaff Intake:  None > 12 hrs NPO After: 8:00pm   Lungs:  clear O2 Sat: 97% on room air. IV 0.9% NS with Angio Cath:  22g  IV Site: R Hand x 1, tolerated well IV Started by:  Irven [place], RN  Chest Size (in):  40 Cup Size: n/a  Height: 5\' 10"  (1.778 m)  Weight:  197 lb (89.359 kg)  BMI:  Body mass index is 28.27 kg/(m^2). Tech Comments:  Patient held Toprol x 24 hrs. Irven Baltimore, RN.    Nuclear Med Study 1 or 2 day study: 1 day  Stress Test Type:  Stress  Reading MD: N/A  Order Authorizing Provider:  Candee Furbish, MD  Resting Radionuclide: Technetium 35m Sestamibi  Resting Radionuclide Dose: 11.0 mCi   Stress Radionuclide:  Technetium 54m Sestamibi  Stress Radionuclide Dose: 33.0 mCi           Stress Protocol Rest HR: 47 Stress HR: 139  Rest BP: 128/84 Stress BP: 176/69  Exercise Time (min): 10:51 METS: 12.1   Predicted Max HR: 157 bpm % Max HR: 88.54 bpm Rate Pressure Product: 43329   Dose of Adenosine (mg):  n/a Dose of Lexiscan: n/a mg  Dose of Atropine (mg): n/a Dose of Dobutamine: n/a mcg/kg/min (at max HR)  Stress Test Technologist: [name], RN  Nuclear Technologist:  [name], CNMT     Rest Procedure:  Myocardial perfusion imaging was performed at rest 45 minutes following the intravenous administration of Technetium 62m Sestamibi. Rest ECG: NSR - Normal  EKG  Stress Procedure:  The patient exercised on the treadmill utilizing the Bruce Protocol for 10:51 minutes. The patient stopped due to fatigue and dyspnea and denied any chest pain.  Technetium 23m Sestamibi was injected at peak exercise and myocardial perfusion imaging was performed after a brief delay. Stress ECG: No significant change from baseline ECG  QPS Raw Data Images:  Normal; no motion artifact; normal heart/lung ratio. Stress Images:  There is a small, severe defect in the distal anterior wall with normal  uptake in the other segments.  Rest Images:  Normal homogeneous uptake in all areas of the myocardium. Subtraction (SDS):  Consistent with a small but severe area of reversible ischemia in the dital anterior wall   Transient Ischemic Dilatation (Normal <1.22):  1.18 Lung/Heart Ratio (Normal <0.45):  0.30  Quantitative Gated Spect Images QGS EDV:  120 ml QGS ESV:  65 ml  Impression Exercise Capacity:  Good exercise capacity. BP Response:  Normal blood pressure response. Clinical Symptoms:  No significant symptoms noted. ECG Impression:  No significant ST segment change suggestive of ischemia. Comparison with Prior Nuclear Study: No images to compare  Overall Impression:  High risk stress nuclear study .   There is a small but severe area of ischemia in the distal anterior wall. .  LV Ejection Fraction: 46%.  LV  Wall Motion:  There is mild hypokinesis of the dista anterior wall.  The overall LV function is mildly depressed.     [name], [name]., MD, [facility] 05/11/2014, 4:09 PM  [address]  [place] Pager [phone]

## 2014-05-12 ENCOUNTER — Encounter (HOSPITAL_COMMUNITY): Payer: Self-pay

## 2014-05-16 ENCOUNTER — Encounter (HOSPITAL_COMMUNITY): Payer: Self-pay

## 2014-05-18 ENCOUNTER — Ambulatory Visit (INDEPENDENT_AMBULATORY_CARE_PROVIDER_SITE_OTHER): Payer: BC Managed Care – PPO | Admitting: Cardiology

## 2014-05-18 ENCOUNTER — Encounter: Payer: Self-pay | Admitting: Cardiology

## 2014-05-18 ENCOUNTER — Encounter (HOSPITAL_COMMUNITY): Payer: Self-pay

## 2014-05-18 VITALS — BP 140/84 | HR 64 | Ht 70.0 in | Wt 205.8 lb

## 2014-05-18 DIAGNOSIS — I10 Essential (primary) hypertension: Secondary | ICD-10-CM

## 2014-05-18 DIAGNOSIS — Z01812 Encounter for preprocedural laboratory examination: Secondary | ICD-10-CM

## 2014-05-18 DIAGNOSIS — R9439 Abnormal result of other cardiovascular function study: Secondary | ICD-10-CM

## 2014-05-18 DIAGNOSIS — E7800 Pure hypercholesterolemia, unspecified: Secondary | ICD-10-CM

## 2014-05-18 DIAGNOSIS — I2510 Atherosclerotic heart disease of native coronary artery without angina pectoris: Secondary | ICD-10-CM

## 2014-05-18 NOTE — Patient Instructions (Signed)
Your physician recommends that you continue on your current medications as directed. Please refer to the Current Medication list given to you today.  Your physician has requested that you have a  Left cardiac catheterization. Cardiac catheterization is used to diagnose and/or treat various heart conditions. Doctors may recommend this procedure for a number of different reasons. The most common reason is to evaluate chest pain. Chest pain can be a symptom of coronary artery disease (CAD), and cardiac catheterization can show whether plaque is narrowing or blocking your heart's arteries. This procedure is also used to evaluate the valves, as well as measure the blood flow and oxygen levels in different parts of your heart. For further information please visit [email or web address]. Please follow instruction sheet, as given. On 05/23/2014  Your physician recommends that you schedule a follow-up appointment in: 2 weeks after Catheterization

## 2014-05-18 NOTE — Progress Notes (Addendum)
[facility]. [address] [place], [place]  [address] Phone: [phone] Fax:  [phone]  Date:  05/18/2014   ID:  [name], DOB 07-03-1951, MRN [id number]  PCP:  Vikki Ports, MD   History of Present Illness: [name] is a 63 y.o. male with coronary artery disease status post drug eluting stent to LAD in July of 2010 (following abnormal nuclear stress test), hyperlipidemia, hypertension, anxiety here for followup after abnormal nuclear stress test demonstrating a moderate-sized defect along the mid to distal LAD territory/anterior/apical wall.  After walking for 10 min. He is feeling chest pain. SSCP, no radiation, mild SOB, no nausea. Less intense than before in 2010.   At a prior visit he was describing low blood pressures and his lisinopril and metoprolol (subsequently stopped lisinopril). No longer on Plavix.   He also has a residual LAD lesion medical management. He also developed a rash on Imdur. Prednisone helped and stopped Imdur. Tried Imdur again and developed rash. No recent chest pain. No longer daytime fatigue. He denies any excessive snoring.      Wt Readings from Last 3 Encounters:  05/18/14 205 lb 12.8 oz (93.35 kg)  05/11/14 197 lb (89.359 kg)  04/26/14 201 lb 12.8 oz (91.536 kg)     Past Medical History  Diagnosis Date  . Hyperlipidemia   . Anxiety 05/2010  . Prostate cancer 01/2008     s/p brachiotherapy; Dr. Karsten Ro  . CAD (coronary artery disease) 2010    stent to LAD 06/2009; Dr. Marlou Porch  . Kidney stone 03/2010  . ED (erectile dysfunction)   . Irregular heartbeat     started on beta blocker by Dr. Marlou Porch, improved  . Shingles 2010    Past Surgical History  Procedure Laterality Date  . Heart stent  06/2009    drug-eluting stent LAD  . Inguinal hernia repair  10/1998    left  . Colonoscopy  2005  . Vasectomy    . Radioactive seed implant  01/2008    prostate cancer    Current Outpatient Prescriptions  Medication  Sig Dispense Refill  . ALPRAZolam (XANAX) 0.5 MG tablet Take 1 tablet (0.5 mg total) by mouth at bedtime as needed.  30 tablet  0  . aspirin 81 MG tablet Take 81 mg by mouth daily.        Marland Kitchen atorvastatin (LIPITOR) 40 MG tablet Take 1 tablet (40 mg total) by mouth daily.  90 tablet  1  . citalopram (CELEXA) 40 MG tablet Take 1 tablet (40 mg total) by mouth daily.  90 tablet  3  . esomeprazole (NEXIUM) 20 MG capsule Take 20 mg by mouth daily at 12 noon.      . fish oil-omega-3 fatty acids 1000 MG capsule Take 1 g by mouth daily.        . metoprolol succinate (TOPROL-XL) 25 MG 24 hr tablet TAKE 1 TABLET BY MOUTH ONCE DAILY  30 tablet  12  . vardenafil (LEVITRA) 20 MG tablet Take 20 mg by mouth daily as needed.         No current facility-administered medications for this visit.    Allergies:    Allergies  Allergen Reactions  . Imdur [Isosorbide Mononitrate] Rash    Social History:  The patient  reports that he has quit smoking. His smoking use included Cigarettes, Pipe, and Cigars. He smoked 0.00 packs per day. He has never used smokeless tobacco. He reports that  he drinks alcohol. He reports that he does not use illicit drugs.   Family History  Problem Relation Age of Onset  . Heart disease Father 12  . Cancer Father 47    prostate  . Hypertension Father   . Hyperlipidemia Father   . Cancer Mother     bladder  . Eating disorder Mother   . Hodgkin's lymphoma Daughter   . Cardiomyopathy Daughter     related to treatment for lymphoma  . Cancer Daughter     thyroid  . Heart disease Daughter     congenital ASD, s/p repair  . Cancer Paternal Grandfather     prostate  . Diabetes Neg Hx     ROS:  Please see the history of present illness.   Denies any fevers, chills, orthopnea, PND  Some numbness in right hand.  All other systems reviewed and negative.   PHYSICAL EXAM: VS:  BP 140/84  Pulse 64  Ht 5\' 10"  (1.778 m)  Wt 205 lb 12.8 oz (93.35 kg)  BMI 29.53 kg/m2 Well nourished,  well developed, in no acute distress HEENT: normal, [place]/AT, EOMI Neck: no JVD, normal carotid upstroke, no bruit Cardiac:  normal S1, S2; RRR; no murmur Lungs:  clear to auscultation bilaterally, no wheezing, rhonchi or rales Abd: soft, nontender, no hepatomegaly, no bruits Ext: no edema, 2+ distal pulses 2+ radial pulse Skin: warm and dry GU: deferred Neuro: no focal abnormalities noted, AAO x 3  EKG:  Sinus bradycardia rate 55 with no other abnormalities    LABS: LDL 51  ASSESSMENT AND PLAN:  1. Abnormal nuclear stress test/Coronary artery disease-LAD stent. We will proceed with cardiac catheterization, risks of stroke, death, bleeding have been explained to the patient. Radial artery approach. Aspirin. Having chest pain for the first time with exertion substernal less intensity than 2010.  He does have a residual LAD lesion. Hold metoprolol prior to stress test. Secondary prevention. 2. Hypertension-blood pressure mildly elevated today. Optimally would like systolic less than 485. We will contemplate adding low-dose amlodipine 5 mg as an antianginal medication. 3. Hyperlipidemia-excellent LDL 51, less than 70 at goal. Atorvastatin. 4. We will see back in 2 weeks after catheterization. Monitor symptoms closely.  Cath Lab Visit (complete for each Cath Lab visit)  Clinical Evaluation Leading to the Procedure:   ACS: no  Non-ACS:    Anginal Classification: CCS III  Anti-ischemic medical therapy: Minimal Therapy (1 class of medications)  Non-Invasive Test Results: Intermediate-risk stress test findings: cardiac mortality 1-3%/year  Prior CABG: No previous CABG   Orders placed for cath.  Signed, Candee Furbish, MD [facility]  05/18/2014 3:41 PM

## 2014-05-18 NOTE — Addendum Note (Signed)
Addended by: Jerline Pain on: 05/18/2014 04:27 PM   Modules accepted: Orders

## 2014-05-19 ENCOUNTER — Encounter (HOSPITAL_COMMUNITY): Payer: Self-pay

## 2014-05-19 LAB — BASIC METABOLIC PANEL
BUN: 14 mg/dL (ref 6–23)
CO2: 31 mEq/L (ref 19–32)
Calcium: 9.4 mg/dL (ref 8.4–10.5)
Chloride: 106 mEq/L (ref 96–112)
Creatinine, Ser: 1 mg/dL (ref 0.4–1.5)
GFR: 83.9 mL/min (ref >60.00)
Glucose, Bld: 93 mg/dL (ref 70–99)
POTASSIUM: 4.6 meq/L (ref 3.5–5.1)
Sodium: 141 mEq/L (ref 135–145)

## 2014-05-19 LAB — CBC WITH DIFFERENTIAL/PLATELET
Basophils Absolute: 0 10*3/uL (ref 0.0–0.1)
Basophils Relative: 0.7 % (ref 0.0–3.0)
EOS ABS: 0.1 10*3/uL (ref 0.0–0.7)
Eosinophils Relative: 2.1 % (ref 0.0–5.0)
HCT: 42.3 % (ref 39.0–52.0)
Hemoglobin: 14.4 g/dL (ref 13.0–17.0)
LYMPHS PCT: 26.3 % (ref 12.0–46.0)
Lymphs Abs: 1.4 10*3/uL (ref 0.7–4.0)
MCHC: 34 g/dL (ref 30.0–36.0)
MCV: 97.2 fl (ref 78.0–100.0)
Monocytes Absolute: 0.6 10*3/uL (ref 0.1–1.0)
Monocytes Relative: 10.7 % (ref 3.0–12.0)
NEUTROS PCT: 60.2 % (ref 43.0–77.0)
Neutro Abs: 3.2 10*3/uL (ref 1.4–7.7)
Platelets: 176 10*3/uL (ref 150.0–400.0)
RBC: 4.35 Mil/uL (ref 4.22–5.81)
RDW: 13.6 % (ref 11.5–15.5)
WBC: 5.4 10*3/uL (ref 4.0–10.5)

## 2014-05-19 LAB — PROTIME-INR
INR: 1 ratio (ref 0.8–1.0)
Prothrombin Time: 11.5 s (ref 9.6–13.1)

## 2014-05-23 ENCOUNTER — Encounter (HOSPITAL_COMMUNITY): Payer: Self-pay | Admitting: General Practice

## 2014-05-23 ENCOUNTER — Encounter (HOSPITAL_COMMUNITY)
Admission: RE | Disposition: A | Discharge status: Home / Self Care | Payer: Self-pay | Source: Ambulatory Visit | Attending: Cardiology

## 2014-05-23 ENCOUNTER — Encounter (HOSPITAL_COMMUNITY): Payer: Self-pay

## 2014-05-23 ENCOUNTER — Inpatient Hospital Stay (HOSPITAL_COMMUNITY)
Admission: RE | Admit: 2014-05-23 | Discharge: 2014-05-25 | DRG: 247 | Disposition: A | Discharge status: Home / Self Care | Payer: BC Managed Care – PPO | Source: Ambulatory Visit | Attending: Cardiology | Admitting: Cardiology

## 2014-05-23 DIAGNOSIS — E7800 Pure hypercholesterolemia, unspecified: Secondary | ICD-10-CM | POA: Diagnosis present

## 2014-05-23 DIAGNOSIS — R079 Chest pain, unspecified: Secondary | ICD-10-CM | POA: Diagnosis present

## 2014-05-23 DIAGNOSIS — I2589 Other forms of chronic ischemic heart disease: Secondary | ICD-10-CM | POA: Diagnosis present

## 2014-05-23 DIAGNOSIS — R9439 Abnormal result of other cardiovascular function study: Secondary | ICD-10-CM | POA: Diagnosis present

## 2014-05-23 DIAGNOSIS — I255 Ischemic cardiomyopathy: Secondary | ICD-10-CM | POA: Diagnosis present

## 2014-05-23 DIAGNOSIS — Z8249 Family history of ischemic heart disease and other diseases of the circulatory system: Secondary | ICD-10-CM

## 2014-05-23 DIAGNOSIS — Z888 Allergy status to other drugs, medicaments and biological substances status: Secondary | ICD-10-CM

## 2014-05-23 DIAGNOSIS — Z9861 Coronary angioplasty status: Secondary | ICD-10-CM

## 2014-05-23 DIAGNOSIS — F411 Generalized anxiety disorder: Secondary | ICD-10-CM | POA: Diagnosis present

## 2014-05-23 DIAGNOSIS — I2510 Atherosclerotic heart disease of native coronary artery without angina pectoris: Principal | ICD-10-CM | POA: Diagnosis present

## 2014-05-23 DIAGNOSIS — I10 Essential (primary) hypertension: Secondary | ICD-10-CM | POA: Diagnosis present

## 2014-05-23 DIAGNOSIS — I2089 Other forms of angina pectoris: Secondary | ICD-10-CM | POA: Diagnosis present

## 2014-05-23 DIAGNOSIS — Z955 Presence of coronary angioplasty implant and graft: Secondary | ICD-10-CM

## 2014-05-23 DIAGNOSIS — Z7982 Long term (current) use of aspirin: Secondary | ICD-10-CM

## 2014-05-23 DIAGNOSIS — I208 Other forms of angina pectoris: Secondary | ICD-10-CM | POA: Diagnosis present

## 2014-05-23 DIAGNOSIS — E785 Hyperlipidemia, unspecified: Secondary | ICD-10-CM | POA: Diagnosis present

## 2014-05-23 DIAGNOSIS — Z8546 Personal history of malignant neoplasm of prostate: Secondary | ICD-10-CM

## 2014-05-23 DIAGNOSIS — I498 Other specified cardiac arrhythmias: Secondary | ICD-10-CM | POA: Diagnosis not present

## 2014-05-23 HISTORY — DX: Acute myocardial infarction, unspecified: I21.9

## 2014-05-23 HISTORY — PX: LEFT HEART CATHETERIZATION WITH CORONARY ANGIOGRAM: SHX5451

## 2014-05-23 HISTORY — PX: CARDIAC CATHETERIZATION: SHX172

## 2014-05-23 HISTORY — DX: Essential (primary) hypertension: I10

## 2014-05-23 HISTORY — DX: Gastro-esophageal reflux disease without esophagitis: K21.9

## 2014-05-23 SURGERY — LEFT HEART CATHETERIZATION WITH CORONARY ANGIOGRAM
Anesthesia: LOCAL

## 2014-05-23 MED ORDER — MIDAZOLAM HCL 2 MG/2ML IJ SOLN
INTRAMUSCULAR | Status: AC
Start: 2014-05-23 — End: 2014-05-23
  Filled 2014-05-23: qty 2

## 2014-05-23 MED ORDER — HEPARIN SODIUM (PORCINE) 1000 UNIT/ML IJ SOLN
INTRAMUSCULAR | Status: AC
Start: 2014-05-23 — End: 2014-05-23
  Filled 2014-05-23: qty 1

## 2014-05-23 MED ORDER — CITALOPRAM HYDROBROMIDE 40 MG PO TABS
40.0000 mg | ORAL_TABLET | Freq: Every day | ORAL | Status: DC
  Administered 2014-05-25: 40 mg via ORAL
  Filled 2014-05-23 (×3): qty 1

## 2014-05-23 MED ORDER — LIDOCAINE HCL (PF) 1 % IJ SOLN
INTRAMUSCULAR | Status: AC
  Filled 2014-05-23: qty 30

## 2014-05-23 MED ORDER — ASPIRIN 81 MG PO CHEW: 81.0000 mg | CHEWABLE_TABLET | ORAL | Status: DC

## 2014-05-23 MED ORDER — HEPARIN (PORCINE) IN NACL 100-0.45 UNIT/ML-% IJ SOLN
1150.0000 [IU]/h | INTRAMUSCULAR | Status: DC
  Administered 2014-05-23: 22:00:00 1300 [IU]/h via INTRAVENOUS
  Filled 2014-05-23 (×2): qty 250

## 2014-05-23 MED ORDER — METOPROLOL SUCCINATE ER 50 MG PO TB24
50.0000 mg | ORAL_TABLET | Freq: Every day | ORAL | Status: DC
  Administered 2014-05-25: 10:00:00 50 mg via ORAL
  Filled 2014-05-23 (×2): qty 1

## 2014-05-23 MED ORDER — DIAZEPAM 5 MG PO TABS: 5.0000 mg | ORAL_TABLET | ORAL | Status: DC | PRN

## 2014-05-23 MED ORDER — SODIUM CHLORIDE 0.9 % IJ SOLN: 3.0000 mL | Freq: Two times a day (BID) | INTRAMUSCULAR | Status: DC

## 2014-05-23 MED ORDER — HEPARIN (PORCINE) IN NACL 2-0.9 UNIT/ML-% IJ SOLN
INTRAMUSCULAR | Status: AC
  Filled 2014-05-23: qty 1000

## 2014-05-23 MED ORDER — SODIUM CHLORIDE 0.9 % IV SOLN
1.0000 mL/kg/h | INTRAVENOUS | Status: DC
  Administered 2014-05-23: 1 mL/kg/h via INTRAVENOUS

## 2014-05-23 MED ORDER — ASPIRIN 81 MG PO CHEW
81.0000 mg | CHEWABLE_TABLET | ORAL | Status: AC
  Administered 2014-05-23: 81 mg via ORAL
  Filled 2014-05-23: qty 1

## 2014-05-23 MED ORDER — ASPIRIN EC 81 MG PO TBEC
81.0000 mg | DELAYED_RELEASE_TABLET | Freq: Every day | ORAL | Status: DC
  Administered 2014-05-25: 10:00:00 81 mg via ORAL
  Filled 2014-05-23 (×2): qty 1

## 2014-05-23 MED ORDER — ASPIRIN 81 MG PO TABS: 81.0000 mg | ORAL_TABLET | Freq: Every day | ORAL | Status: DC

## 2014-05-23 MED ORDER — VERAPAMIL HCL 2.5 MG/ML IV SOLN
INTRAVENOUS | Status: AC
  Filled 2014-05-23: qty 2

## 2014-05-23 MED ORDER — OXYCODONE-ACETAMINOPHEN 5-325 MG PO TABS: 1.0000 | ORAL_TABLET | ORAL | Status: DC | PRN

## 2014-05-23 MED ORDER — SODIUM CHLORIDE 0.9 % IV SOLN: 250.0000 mL | INTRAVENOUS | Status: DC | PRN

## 2014-05-23 MED ORDER — ATORVASTATIN CALCIUM 40 MG PO TABS
40.0000 mg | ORAL_TABLET | Freq: Every day | ORAL | Status: DC
  Administered 2014-05-23: 40 mg via ORAL
  Filled 2014-05-23 (×2): qty 1

## 2014-05-23 MED ORDER — SODIUM CHLORIDE 0.9 % IV SOLN
1.0000 mL/kg/h | INTRAVENOUS | Status: AC
  Administered 2014-05-23: 1 mL/kg/h via INTRAVENOUS

## 2014-05-23 MED ORDER — SODIUM CHLORIDE 0.9 % IJ SOLN: 3.0000 mL | INTRAMUSCULAR | Status: DC | PRN

## 2014-05-23 MED ORDER — NITROGLYCERIN 0.2 MG/ML ON CALL CATH LAB
INTRAVENOUS | Status: AC
  Filled 2014-05-23: qty 1

## 2014-05-23 MED ORDER — FENTANYL CITRATE 0.05 MG/ML IJ SOLN
INTRAMUSCULAR | Status: AC
  Filled 2014-05-23: qty 2

## 2014-05-23 MED ORDER — CLOPIDOGREL BISULFATE 300 MG PO TABS
ORAL_TABLET | ORAL | Status: AC
  Filled 2014-05-23: qty 1

## 2014-05-23 MED ORDER — SODIUM CHLORIDE 0.9 % IV SOLN: 1.0000 mL/kg/h | INTRAVENOUS | Status: DC

## 2014-05-23 MED ORDER — PANTOPRAZOLE SODIUM 40 MG PO TBEC
40.0000 mg | DELAYED_RELEASE_TABLET | Freq: Every day | ORAL | Status: DC
Start: 2014-05-24 — End: 2014-05-25
  Administered 2014-05-24 – 2014-05-25 (×2): 40 mg via ORAL
  Filled 2014-05-23 (×2): qty 1

## 2014-05-23 MED ORDER — MIDAZOLAM HCL 2 MG/2ML IJ SOLN
INTRAMUSCULAR | Status: AC
  Filled 2014-05-23: qty 2

## 2014-05-23 NOTE — Interval H&P Note (Signed)
History and Physical Interval Note:  05/23/2014 1:13 PM  [name]  has presented today for surgery, with the diagnosis of cp  The various methods of treatment have been discussed with the patient and family. After consideration of risks, benefits and other options for treatment, the patient has consented to  Procedure(s): LEFT HEART CATHETERIZATION WITH CORONARY ANGIOGRAM (N/A) as a surgical intervention .  The patient's history has been reviewed, patient examined, no change in status, stable for surgery.  I have reviewed the patient's chart and labs.  Questions were answered to the patient's satisfaction.     [employer]

## 2014-05-23 NOTE — H&P (View-Only) (Signed)
[facility] [address], [address], [place]  [address] Phone: [phone] Fax:  [phone]  Date:  05/18/2014   ID:  [name], DOB [birth year], MRN [id number]  PCP:  Vikki Ports, MD   History of Present Illness: [name] is a 63 y.o. male with coronary artery disease status post drug eluting stent to LAD in July of 2010 (following abnormal nuclear stress test), hyperlipidemia, hypertension, anxiety here for followup after abnormal nuclear stress test demonstrating a moderate-sized defect along the mid to distal LAD territory/anterior/apical wall.  After walking for 10 min. He is feeling chest pain. SSCP, no radiation, mild SOB, no nausea. Less intense than before in 2010.   At a prior visit he was describing low blood pressures and his lisinopril and metoprolol (subsequently stopped lisinopril). No longer on Plavix.   He also has a residual LAD lesion medical management. He also developed a rash on Imdur. Prednisone helped and stopped Imdur. Tried Imdur again and developed rash. No recent chest pain. No longer daytime fatigue. He denies any excessive snoring.      Wt Readings from Last 3 Encounters:  05/18/14 205 lb 12.8 oz (93.35 kg)  05/11/14 197 lb (89.359 kg)  04/26/14 201 lb 12.8 oz (91.536 kg)     Past Medical History  Diagnosis Date  . Hyperlipidemia   . Anxiety 05/2010  . Prostate cancer 01/2008     s/p brachiotherapy; Dr. Karsten Ro  . CAD (coronary artery disease) 2010    stent to LAD 06/2009; Dr. Marlou Porch  . Kidney stone 03/2010  . ED (erectile dysfunction)   . Irregular heartbeat     started on beta blocker by Dr. Marlou Porch, improved  . Shingles 2010    Past Surgical History  Procedure Laterality Date  . Heart stent  06/2009    drug-eluting stent LAD  . Inguinal hernia repair  10/1998    left  . Colonoscopy  2005  . Vasectomy    . Radioactive seed implant  01/2008    prostate cancer    Current Outpatient Prescriptions  Medication  Sig Dispense Refill  . ALPRAZolam (XANAX) 0.5 MG tablet Take 1 tablet (0.5 mg total) by mouth at bedtime as needed.  30 tablet  0  . aspirin 81 MG tablet Take 81 mg by mouth daily.        Marland Kitchen atorvastatin (LIPITOR) 40 MG tablet Take 1 tablet (40 mg total) by mouth daily.  90 tablet  1  . citalopram (CELEXA) 40 MG tablet Take 1 tablet (40 mg total) by mouth daily.  90 tablet  3  . esomeprazole (NEXIUM) 20 MG capsule Take 20 mg by mouth daily at 12 noon.      . fish oil-omega-3 fatty acids 1000 MG capsule Take 1 g by mouth daily.        . metoprolol succinate (TOPROL-XL) 25 MG 24 hr tablet TAKE 1 TABLET BY MOUTH ONCE DAILY  30 tablet  12  . vardenafil (LEVITRA) 20 MG tablet Take 20 mg by mouth daily as needed.         No current facility-administered medications for this visit.    Allergies:    Allergies  Allergen Reactions  . Imdur [Isosorbide Mononitrate] Rash    Social History:  The patient  reports that he has quit smoking. His smoking use included Cigarettes, Pipe, and Cigars. He smoked 0.00 packs per day. He has never used smokeless tobacco. He reports that  he drinks alcohol. He reports that he does not use illicit drugs.   Family History  Problem Relation Age of Onset  . Heart disease Father 12  . Cancer Father 47    prostate  . Hypertension Father   . Hyperlipidemia Father   . Cancer Mother     bladder  . Eating disorder Mother   . Hodgkin's lymphoma Daughter   . Cardiomyopathy Daughter     related to treatment for lymphoma  . Cancer Daughter     thyroid  . Heart disease Daughter     congenital ASD, s/p repair  . Cancer Paternal Grandfather     prostate  . Diabetes Neg Hx     ROS:  Please see the history of present illness.   Denies any fevers, chills, orthopnea, PND  Some numbness in right hand.  All other systems reviewed and negative.   PHYSICAL EXAM: VS:  BP 140/84  Pulse 64  Ht 5\' 10"  (1.778 m)  Wt 205 lb 12.8 oz (93.35 kg)  BMI 29.53 kg/m2 Well nourished,  well developed, in no acute distress HEENT: normal, [place]/AT, EOMI Neck: no JVD, normal carotid upstroke, no bruit Cardiac:  normal S1, S2; RRR; no murmur Lungs:  clear to auscultation bilaterally, no wheezing, rhonchi or rales Abd: soft, nontender, no hepatomegaly, no bruits Ext: no edema, 2+ distal pulses 2+ radial pulse Skin: warm and dry GU: deferred Neuro: no focal abnormalities noted, AAO x 3  EKG:  Sinus bradycardia rate 55 with no other abnormalities    LABS: LDL 51  ASSESSMENT AND PLAN:  1. Abnormal nuclear stress test/Coronary artery disease-LAD stent. We will proceed with cardiac catheterization, risks of stroke, death, bleeding have been explained to the patient. Radial artery approach. Aspirin. Having chest pain for the first time with exertion substernal less intensity than 2010.  He does have a residual LAD lesion. Hold metoprolol prior to stress test. Secondary prevention. 2. Hypertension-blood pressure mildly elevated today. Optimally would like systolic less than 485. We will contemplate adding low-dose amlodipine 5 mg as an antianginal medication. 3. Hyperlipidemia-excellent LDL 51, less than 70 at goal. Atorvastatin. 4. We will see back in 2 weeks after catheterization. Monitor symptoms closely.  Cath Lab Visit (complete for each Cath Lab visit)  Clinical Evaluation Leading to the Procedure:   ACS: no  Non-ACS:    Anginal Classification: CCS III  Anti-ischemic medical therapy: Minimal Therapy (1 class of medications)  Non-Invasive Test Results: Intermediate-risk stress test findings: cardiac mortality 1-3%/year  Prior CABG: No previous CABG   Orders placed for cath.  Signed, Candee Furbish, MD [facility]  05/18/2014 3:41 PM

## 2014-05-23 NOTE — CV Procedure (Addendum)
    CARDIAC CATHETERIZATION  PROCEDURE:  Left heart catheterization with selective coronary angiography, left ventriculogram via the radial artery approach.  INDICATIONS:  63 year old with coronary artery disease status post DES to LAD in July of 2010 with hyperlipidemia, hypertension, anxiety with recent abnormal nuclear stress test demonstrating moderate size defect along the mid to distal LAD territory/anterior/apical wall. After walking for approximately 10 minutes he is feeling chest pain substernal with no radiation. He tried isosorbide in the past but developed a rash.  The risks, benefits, and details of the procedure were explained to the patient, including possibilities of stroke, heart attack, death, renal impairment, arterial damage, bleeding.  The patient verbalized understanding and wanted to proceed.  Informed written consent was obtained.  PROCEDURE TECHNIQUE:  Allen's test was performed pre-and post procedure and was normal. The right radial artery site was prepped and draped in a sterile fashion. One percent lidocaine was used for local anesthesia. Using the modified Seldinger technique a 5 French hydrophilic sheath was inserted into the radial artery without difficulty. 3 mg of verapamil was administered via the sheath. A Judkins right #4 catheter with the guidance of a Versicore wire was placed in the right coronary cusp and selectively cannulated the right coronary artery. After traversing the aortic arch, 4000 units of heparin IV was administered. A Judkins left #3.5 catheter was used to selectively cannulate the left main artery. Multiple views with hand injection of Omnipaque were obtained. Catheter a pigtail catheter was used to cross into the left ventricle, hemodynamics were obtained, and a left ventriculogram was performed in the RAO position with power injection. Following the procedure, sheath was removed, patient was hemodynamically stable, hemostasis was maintained with a  Terumo T band.   CONTRAST:  Total of 70 ml.    FLOUROSCOPY TIME: 2.0 min.  COMPLICATIONS:  None.    HEMODYNAMICS:  Aortic pressure was 030/09QZRA; LV systolic pressure was 076AUQJ; LVEDP 19mmHg.  There was no gradient between the left ventricle and aorta.    ANGIOGRAPHIC DATA:    Left main: No angiographically significant disease. Branches into LAD and circumflex.  Left anterior descending (LAD): Just proximal to previously placed stent there is 30% stenosis. Distal to the stent, in the mid vessel there is 99%, subtotal LAD with slow distal flow in the remaining portion of his LAD.  Circumflex artery (CIRC): No angiographically significant disease, tortuous.  Right coronary artery (RCA): No angiographically significant disease, gives rise to the posterior descending artery, dominant  LEFT VENTRICULOGRAM:  Left ventricular angiogram was done in the 30 RAO projection and revealed hypokinesis of the mid to distal anterior wall with an estimated ejection fraction of 50%.   IMPRESSIONS:  99%/subtotal mid LAD lesion distal to previously placed stent. This correlates with nuclear abnormality. Abormal left ventricular systolic function.  LVEDP 10 mmHg.  Ejection fraction 50% with hypokinesis of the mid to distal anterior segment.  RECOMMENDATION:  Discussed with interventional cardiology, we will admit and loaded with Plavix 600 mg, placed on heparin 8 hours after sheath pull. Intervention will take place tomorrow.

## 2014-05-23 NOTE — Progress Notes (Signed)
ANTICOAGULATION CONSULT NOTE - Initial Consult  Pharmacy Consult for heparin Indication: chest pain/ACS  Allergies  Allergen Reactions  . Imdur [Isosorbide Mononitrate] Rash    Patient Measurements: Height: 5\' 10"  (177.8 cm) Weight: 203 lb (92.08 kg) IBW/kg (Calculated) : 73 Heparin Dosing Weight: 92 kg  Vital Signs: Temp: 97.8 F (36.6 C) (05/26 1422) Temp src: Oral (05/26 1422) BP: 127/81 mmHg (05/26 1422) Pulse Rate: 52 (05/26 1422)  Labs: No results found for this basename: HGB, HCT, PLT, APTT, LABPROT, INR, HEPARINUNFRC, CREATININE, CKTOTAL, CKMB, TROPONINI,  in the last 72 hours  Estimated Creatinine Clearance: 86.2 ml/min (by C-G formula based on Cr of 1).   Medical History: Past Medical History  Diagnosis Date  . Hyperlipidemia   . Anxiety 05/2010  . Prostate cancer 01/2008     s/p brachiotherapy; Dr. Karsten Ro  . CAD (coronary artery disease) 2010    stent to LAD 06/2009; Dr. Marlou Porch  . Kidney stone 03/2010  . ED (erectile dysfunction)   . Irregular heartbeat     started on beta blocker by Dr. Marlou Porch, improved  . Shingles 2010    Medications:  Scheduled:  . [START ON 05/24/2014] aspirin EC  81 mg Oral Daily  . atorvastatin  40 mg Oral Daily  . citalopram  40 mg Oral Daily  . [START ON 05/24/2014] metoprolol succinate  50 mg Oral Daily  . [START ON 05/24/2014] pantoprazole  40 mg Oral Daily   Infusions:  . sodium chloride      Assessment: 63 yo male s/p cath will be put on heparin therapy 8hrs post sheath removal.  Sheath was removed at around 1400 per RN.  Patient was found to have 99% stenosis of mid LAD lesion distal to previously placed stent.  Patient will go for intervention tomorrow.  Goal of Therapy:  Heparin level 0.3-0.7 units/ml Monitor platelets by anticoagulation protocol: Yes   Plan:  1) Start heparin at 1300 units/hr at 2200. No bolus 2) 8hr heparin level after drip is started 3) Daily heparin level and CBC  Tsz-Yin Aunisty Reali 05/23/2014,2:29  PM

## 2014-05-23 NOTE — Progress Notes (Signed)
TR BAND REMOVAL  LOCATION:  right radial  DEFLATED PER PROTOCOL:  yes  TIME BAND OFF / DRESSING APPLIED:   1700   SITE UPON ARRIVAL:   Level 0  SITE AFTER BAND REMOVAL:  Level 0  REVERSE ALLEN'S TEST:    positive  CIRCULATION SENSATION AND MOVEMENT:  Within Normal Limits  yes  COMMENTS:

## 2014-05-24 ENCOUNTER — Encounter (HOSPITAL_COMMUNITY): Payer: Self-pay | Admitting: Physician Assistant

## 2014-05-24 ENCOUNTER — Encounter (HOSPITAL_COMMUNITY)
Admission: RE | Disposition: A | Discharge status: Home / Self Care | Payer: BC Managed Care – PPO | Source: Ambulatory Visit | Attending: Cardiology

## 2014-05-24 DIAGNOSIS — I498 Other specified cardiac arrhythmias: Secondary | ICD-10-CM

## 2014-05-24 DIAGNOSIS — I255 Ischemic cardiomyopathy: Secondary | ICD-10-CM | POA: Diagnosis present

## 2014-05-24 DIAGNOSIS — I10 Essential (primary) hypertension: Secondary | ICD-10-CM | POA: Diagnosis present

## 2014-05-24 HISTORY — PX: PERCUTANEOUS CORONARY STENT INTERVENTION (PCI-S): SHX5485

## 2014-05-24 LAB — BASIC METABOLIC PANEL
BUN: 12 mg/dL (ref 6–23)
CALCIUM: 9.2 mg/dL (ref 8.4–10.5)
CO2: 24 meq/L (ref 19–32)
Chloride: 103 mEq/L (ref 96–112)
Creatinine, Ser: 0.82 mg/dL (ref 0.50–1.35)
GFR calc Af Amer: >90 mL/min (ref >90)
Glucose, Bld: 126 mg/dL — ABNORMAL HIGH (ref 70–99)
POTASSIUM: 4.2 meq/L (ref 3.7–5.3)
SODIUM: 139 meq/L (ref 137–147)

## 2014-05-24 LAB — CBC
HCT: 41.8 % (ref 39.0–52.0)
Hemoglobin: 14.7 g/dL (ref 13.0–17.0)
MCH: 33.3 pg (ref 26.0–34.0)
MCHC: 35.2 g/dL (ref 30.0–36.0)
MCV: 94.6 fL (ref 78.0–100.0)
PLATELETS: 155 10*3/uL (ref 150–400)
RBC: 4.42 MIL/uL (ref 4.22–5.81)
RDW: 13.2 % (ref 11.5–15.5)
WBC: 5.3 10*3/uL (ref 4.0–10.5)

## 2014-05-24 LAB — POCT ACTIVATED CLOTTING TIME: ACTIVATED CLOTTING TIME: 597 s

## 2014-05-24 LAB — HEPARIN LEVEL (UNFRACTIONATED): Heparin Unfractionated: 0.91 IU/mL — ABNORMAL HIGH (ref 0.30–0.70)

## 2014-05-24 SURGERY — PERCUTANEOUS CORONARY STENT INTERVENTION (PCI-S)
Anesthesia: LOCAL

## 2014-05-24 MED ORDER — MIDAZOLAM HCL 2 MG/2ML IJ SOLN
INTRAMUSCULAR | Status: AC
  Filled 2014-05-24: qty 2

## 2014-05-24 MED ORDER — SODIUM CHLORIDE 0.9 % IV SOLN
INTRAVENOUS | Status: AC
  Administered 2014-05-24: 100 mL/h via INTRAVENOUS

## 2014-05-24 MED ORDER — SODIUM CHLORIDE 0.9 % IJ SOLN: 3.0000 mL | Freq: Two times a day (BID) | INTRAMUSCULAR | Status: DC

## 2014-05-24 MED ORDER — BIVALIRUDIN 250 MG IV SOLR
INTRAVENOUS | Status: AC
  Filled 2014-05-24: qty 250

## 2014-05-24 MED ORDER — SODIUM CHLORIDE 0.9 % IJ SOLN: 3.0000 mL | INTRAMUSCULAR | Status: DC | PRN

## 2014-05-24 MED ORDER — SODIUM CHLORIDE 0.9 % IV SOLN
INTRAVENOUS | Status: DC
Start: 2014-05-24 — End: 2014-05-24

## 2014-05-24 MED ORDER — LIDOCAINE HCL (PF) 1 % IJ SOLN
INTRAMUSCULAR | Status: AC
  Filled 2014-05-24: qty 30

## 2014-05-24 MED ORDER — ASPIRIN 81 MG PO CHEW: 81.0000 mg | CHEWABLE_TABLET | ORAL | Status: AC

## 2014-05-24 MED ORDER — ASPIRIN 81 MG PO CHEW
81.0000 mg | CHEWABLE_TABLET | Freq: Once | ORAL | Status: DC
  Filled 2014-05-24: qty 1

## 2014-05-24 MED ORDER — FENTANYL CITRATE 0.05 MG/ML IJ SOLN
INTRAMUSCULAR | Status: AC
  Filled 2014-05-24: qty 2

## 2014-05-24 MED ORDER — HEPARIN (PORCINE) IN NACL 2-0.9 UNIT/ML-% IJ SOLN
INTRAMUSCULAR | Status: AC
  Filled 2014-05-24: qty 1500

## 2014-05-24 MED ORDER — ATORVASTATIN CALCIUM 80 MG PO TABS
80.0000 mg | ORAL_TABLET | Freq: Every day | ORAL | Status: DC
  Administered 2014-05-25: 80 mg via ORAL
  Filled 2014-05-24: qty 1

## 2014-05-24 MED ORDER — SODIUM CHLORIDE 0.9 % IV SOLN: 250.0000 mL | INTRAVENOUS | Status: DC | PRN

## 2014-05-24 MED ORDER — NITROGLYCERIN 0.2 MG/ML ON CALL CATH LAB
INTRAVENOUS | Status: AC
  Filled 2014-05-24: qty 1

## 2014-05-24 MED ORDER — CLOPIDOGREL BISULFATE 75 MG PO TABS
ORAL_TABLET | ORAL | Status: AC
  Filled 2014-05-24: qty 1

## 2014-05-24 MED ORDER — CLOPIDOGREL BISULFATE 75 MG PO TABS
75.0000 mg | ORAL_TABLET | Freq: Every day | ORAL | Status: DC
  Administered 2014-05-25: 10:00:00 75 mg via ORAL
  Filled 2014-05-24: qty 1

## 2014-05-24 NOTE — Progress Notes (Signed)
TR BAND REMOVAL  LOCATION:    right radial  DEFLATED PER PROTOCOL:    yes  TIME BAND OFF / DRESSING APPLIED:    1800   SITE UPON ARRIVAL:    Level 1 ( small bruise, Soft)  SITE AFTER BAND REMOVAL:    Level 1  REVERSE ALLEN'S TEST:     positive  CIRCULATION SENSATION AND MOVEMENT:    Within Normal Limits   yes  COMMENTS:   Tolerated procedure well

## 2014-05-24 NOTE — Progress Notes (Signed)
UR completed 

## 2014-05-24 NOTE — CV Procedure (Signed)
     PERCUTANEOUS CORONARY INTERVENTION   [name] is a 63 y.o. male  INDICATION:  High-risk myocardial perfusion study with limiting angina despite antianginal therapy.   PROCEDURE: DES mid LAD   CONSENT: The risks, benefits, and details of the procedure were explained to the patient. Risks including death, MI, stroke, bleeding, limb ischemia, emergency CABG, renal failure and allergy were described and accepted by the patient.  Informed written consent was obtained prior to proceeding.  PROCEDURE TECHNIQUE:  After Xylocaine anesthesia a 5 French Slender sheath was placed in the right radial artery with a single anterior needle wall stick.   Coronary guiding shots were made using a 6 French 3.5 cm XB LAD, and ultimately a 3.0 cm XB LAD catheter. Antithrombotic therapy,bivaliruy, was begun and determined to be therapeutic by ACT. Antiplatelet therapy, bivalirudin, was loaded. The patient was loaded with Plavix 600 mg 24 hours prior to the procedure.  PCI was performed using a Pro-water guidewire. A predilatation with a 15 x 2.0 mm balloon was performed. We then positioned and deployed at 18 x 2.5 mm diameter Xience Alpine to 13 atmospheres. We then post dilated using a 15 x 2.75 Gross balloon to 14 atmospheres. A 0% stenosis was noted post dilatation.  The case was terminated and hemostasis achieved with Wrist Band.  CONTRAST:  Total of 100 cc.  COMPLICATIONS:  None    ANGIOGRAPHIC RESULTS:   99/100% mid LAD reduces 0% with TIMI grade 3 flow after angioplasty and DES implantation post dilated at 2.75 mm in diameter. Clear step up and step down was noted in the stented segment.   IMPRESSIONS:  Successful DES implantation into a 99% mid LAD which was reduced to 0% with TIMI grade 3 flow.   RECOMMENDATION:  Discharge in a.m. assuming no complications. Aspirin and Plavix indefinitely.Marland Kitchen    Sinclair Grooms, MD 05/24/2014 2:18 PM

## 2014-05-24 NOTE — Interval H&P Note (Signed)
Cath Lab Visit (complete for each Cath Lab visit)  Clinical Evaluation Leading to the Procedure:   ACS: no  Non-ACS:    Anginal Classification: CCS III  Anti-ischemic medical therapy: Maximal Therapy (2 or more classes of medications)  Non-Invasive Test Results: Intermediate-risk stress test findings: cardiac mortality 1-3%/year  Prior CABG: No previous CABG      History and Physical Interval Note:  05/24/2014 12:56 PM  [name]  has presented today for surgery, with the diagnosis of CAD  The various methods of treatment have been discussed with the patient and family. After consideration of risks, benefits and other options for treatment, the patient has consented to  Procedure(s): PERCUTANEOUS CORONARY STENT INTERVENTION (PCI-S) (N/A) as a surgical intervention .  The patient's history has been reviewed, patient examined, no change in status, stable for surgery.  I have reviewed the patient's chart and labs.  Questions were answered to the patient's satisfaction.     [name]

## 2014-05-24 NOTE — Progress Notes (Addendum)
Subjective: No complaints  Objective: Vital signs in last 24 hours: Temp:  [97.4 F (36.3 C)-98.1 F (36.7 C)] 97.4 F (36.3 C) (05/27 0623) Pulse Rate:  [48-56] 53 (05/27 0623) Resp:  [18-20] 20 (05/27 0623) BP: (127-156)/(61-103) 143/82 mmHg (05/27 0623) SpO2:  [98 %-100 %] 99 % (05/27 0623) Weight:  [202 lb 9.6 oz (91.9 kg)] 202 lb 9.6 oz (91.9 kg) (05/27 0300) Last BM Date: 05/22/14  Intake/Output from previous day: 05/26 0701 - 05/27 0700 In: 1270.1 [P.O.:600; I.V.:670.1] Out: 300 [Urine:300] Intake/Output this shift:    Medications Current Facility-Administered Medications  Medication Dose Route Frequency Provider Last Rate Last Dose  . 0.9 %  sodium chloride infusion  250 mL Intravenous PRN [name], MD      . 0.9 %  sodium chloride infusion   Intravenous Continuous [name], MD 75 mL/hr at 05/24/14 0900 75 mL/hr at 05/24/14 0900  . aspirin chewable tablet 81 mg  81 mg Oral Once Candee Furbish, MD      . aspirin chewable tablet 81 mg  81 mg Oral Pre-Cath [name], MD      . aspirin EC tablet 81 mg  81 mg Oral Daily Candee Furbish, MD      . atorvastatin (LIPITOR) tablet 40 mg  40 mg Oral Daily Candee Furbish, MD   40 mg at 05/23/14 1538  . citalopram (CELEXA) tablet 40 mg  40 mg Oral Daily Candee Furbish, MD      . diazepam (VALIUM) tablet 5 mg  5 mg Oral Q4H PRN Candee Furbish, MD      . heparin ADULT infusion 100 units/mL (25000 units/250 mL)  1,150 Units/hr Intravenous Continuous Candee Furbish, MD 11.5 mL/hr at 05/24/14 0830 1,150 Units/hr at 05/24/14 0830  . metoprolol succinate (TOPROL-XL) 24 hr tablet 50 mg  50 mg Oral Daily Candee Furbish, MD      . oxyCODONE-acetaminophen (PERCOCET/ROXICET) 5-325 MG per tablet 1-2 tablet  1-2 tablet Oral Q4H PRN Candee Furbish, MD      . pantoprazole (PROTONIX) EC tablet 40 mg  40 mg Oral Daily Candee Furbish, MD   40 mg at 05/24/14 0093  . sodium chloride 0.9 % injection 3 mL  3 mL Intravenous Q12H [name], MD       . sodium chloride 0.9 % injection 3 mL  3 mL Intravenous PRN Sinclair Grooms, MD        PE: General appearance: alert, cooperative and no distress Lungs: clear to auscultation bilaterally Heart: regular rate and rhythm, S1, S2 normal, no murmur, click, rub or gallop Extremities: No LEE Pulses: 2+ and symmetric Skin: Warm and dry.  No ecchymosis, erythema at cath site. Neurologic: Grossly normal  Lab Results:   Recent Labs  05/24/14 0714  WBC 5.3  HGB 14.7  HCT 41.8  PLT 155   BMET  Recent Labs  05/24/14 1000  NA 139  K 4.2  CL 103  CO2 24  GLUCOSE 126*  BUN 12  CREATININE 0.82  CALCIUM 9.2    Assessment/Plan  [name] is a 63 y.o. male with coronary artery disease status post drug eluting stent to LAD in July of 2010 (following abnormal nuclear stress test), hyperlipidemia, hypertension, anxiety here for followup after abnormal nuclear stress test demonstrating a moderate-sized defect along the mid to distal LAD territory/anterior/apical wall.  After walking for 10 min. He is feeling chest pain. SSCP, no  radiation, mild SOB, no nausea. Less intense than before in 2010.   At a prior visit he was describing low blood pressures and his lisinopril and metoprolol (subsequently stopped lisinopril). No longer on Plavix.  He also has a residual LAD lesion medical management. He also developed a rash on Imdur. Prednisone helped and stopped Imdur. Tried Imdur again and developed rash. No recent chest pain. No longer daytime fatigue. He denies any excessive snoring.    Active Problems:   Pure hypercholesterolemia   CAD (coronary artery disease)   Abnormal nuclear stress test   Angina decubitus   Ischemic cardiomyopathy   Hypertension  Plan:   SP LHC by Dr. Marlou Porch yesterday revealing 99%/subtotal mid LAD lesion distal to previously placed stent. This correlates with nuclear abnormality. Abormal left ventricular systolic function. LVEDP 10 mmHg. Ejection fraction  50% with hypokinesis of the mid to distal anterior segment.  PCI planned for today at 1430hrs.  He was loaded on plavix.  ASA, lipitor, toprol XL 50mg , heparin.   Bradycardia on tele: 49.    LOS: 1 day    [name] PA-C 05/24/2014 11:19 AM  As above, patient seen and examined; no chest pain or dyspnea; for PCI of LAD today. Continue ASA, statin (change lipitor to 80 mg daily) and beta blocker. BP borderline. Follow and add additional meds if needed. Lelon Perla

## 2014-05-24 NOTE — H&P (View-Only) (Signed)
Subjective: No complaints  Objective: Vital signs in last 24 hours: Temp:  [97.4 F (36.3 C)-98.1 F (36.7 C)] 97.4 F (36.3 C) (05/27 0623) Pulse Rate:  [48-56] 53 (05/27 0623) Resp:  [18-20] 20 (05/27 0623) BP: (127-156)/(61-103) 143/82 mmHg (05/27 0623) SpO2:  [98 %-100 %] 99 % (05/27 0623) Weight:  [202 lb 9.6 oz (91.9 kg)] 202 lb 9.6 oz (91.9 kg) (05/27 0300) Last BM Date: 05/22/14  Intake/Output from previous day: 05/26 0701 - 05/27 0700 In: 1270.1 [P.O.:600; I.V.:670.1] Out: 300 [Urine:300] Intake/Output this shift:    Medications Current Facility-Administered Medications  Medication Dose Route Frequency Provider Last Rate Last Dose  . 0.9 %  sodium chloride infusion  250 mL Intravenous PRN [name], MD      . 0.9 %  sodium chloride infusion   Intravenous Continuous [name], MD 75 mL/hr at 05/24/14 0900 75 mL/hr at 05/24/14 0900  . aspirin chewable tablet 81 mg  81 mg Oral Once Candee Furbish, MD      . aspirin chewable tablet 81 mg  81 mg Oral Pre-Cath [name], MD      . aspirin EC tablet 81 mg  81 mg Oral Daily Candee Furbish, MD      . atorvastatin (LIPITOR) tablet 40 mg  40 mg Oral Daily Candee Furbish, MD   40 mg at 05/23/14 1538  . citalopram (CELEXA) tablet 40 mg  40 mg Oral Daily Candee Furbish, MD      . diazepam (VALIUM) tablet 5 mg  5 mg Oral Q4H PRN Candee Furbish, MD      . heparin ADULT infusion 100 units/mL (25000 units/250 mL)  1,150 Units/hr Intravenous Continuous Candee Furbish, MD 11.5 mL/hr at 05/24/14 0830 1,150 Units/hr at 05/24/14 0830  . metoprolol succinate (TOPROL-XL) 24 hr tablet 50 mg  50 mg Oral Daily Candee Furbish, MD      . oxyCODONE-acetaminophen (PERCOCET/ROXICET) 5-325 MG per tablet 1-2 tablet  1-2 tablet Oral Q4H PRN Candee Furbish, MD      . pantoprazole (PROTONIX) EC tablet 40 mg  40 mg Oral Daily Candee Furbish, MD   40 mg at 05/24/14 0093  . sodium chloride 0.9 % injection 3 mL  3 mL Intravenous Q12H [name], MD       . sodium chloride 0.9 % injection 3 mL  3 mL Intravenous PRN Sinclair Grooms, MD        PE: General appearance: alert, cooperative and no distress Lungs: clear to auscultation bilaterally Heart: regular rate and rhythm, S1, S2 normal, no murmur, click, rub or gallop Extremities: No LEE Pulses: 2+ and symmetric Skin: Warm and dry.  No ecchymosis, erythema at cath site. Neurologic: Grossly normal  Lab Results:   Recent Labs  05/24/14 0714  WBC 5.3  HGB 14.7  HCT 41.8  PLT 155   BMET  Recent Labs  05/24/14 1000  NA 139  K 4.2  CL 103  CO2 24  GLUCOSE 126*  BUN 12  CREATININE 0.82  CALCIUM 9.2    Assessment/Plan  [name] is a 63 y.o. male with coronary artery disease status post drug eluting stent to LAD in July of 2010 (following abnormal nuclear stress test), hyperlipidemia, hypertension, anxiety here for followup after abnormal nuclear stress test demonstrating a moderate-sized defect along the mid to distal LAD territory/anterior/apical wall.  After walking for 10 min. He is feeling chest pain. SSCP, no  radiation, mild SOB, no nausea. Less intense than before in 2010.   At a prior visit he was describing low blood pressures and his lisinopril and metoprolol (subsequently stopped lisinopril). No longer on Plavix.  He also has a residual LAD lesion medical management. He also developed a rash on Imdur. Prednisone helped and stopped Imdur. Tried Imdur again and developed rash. No recent chest pain. No longer daytime fatigue. He denies any excessive snoring.    Active Problems:   Pure hypercholesterolemia   CAD (coronary artery disease)   Abnormal nuclear stress test   Angina decubitus   Ischemic cardiomyopathy   Hypertension  Plan:   SP LHC by Dr. Marlou Porch yesterday revealing 99%/subtotal mid LAD lesion distal to previously placed stent. This correlates with nuclear abnormality. Abormal left ventricular systolic function. LVEDP 10 mmHg. Ejection fraction  50% with hypokinesis of the mid to distal anterior segment.  PCI planned for today at 1430hrs.  He was loaded on plavix.  ASA, lipitor, toprol XL 50mg , heparin.   Bradycardia on tele: 49.    LOS: 1 day    [name] PA-C 05/24/2014 11:19 AM  As above, patient seen and examined; no chest pain or dyspnea; for PCI of LAD today. Continue ASA, statin (change lipitor to 80 mg daily) and beta blocker. BP borderline. Follow and add additional meds if needed. Lelon Perla

## 2014-05-24 NOTE — Progress Notes (Signed)
  ANTICOAGULATION CONSULT NOTE - Follow Up Consult  Pharmacy Consult for heparin Indication: chest pain/ACS  Allergies  Allergen Reactions  . Imdur [Isosorbide Mononitrate] Rash    Patient Measurements: Height: 5\' 10"  (177.8 cm) Weight: 202 lb 9.6 oz (91.9 kg) IBW/kg (Calculated) : 73 Heparin Dosing Weight:   Vital Signs: Temp: 97.4 F (36.3 C) (05/27 0623) Temp src: Oral (05/27 0623) BP: 143/82 mmHg (05/27 0623) Pulse Rate: 53 (05/27 0623)  Labs:  Recent Labs  05/24/14 0714  HGB 14.7  HCT 41.8  PLT 155  HEPARINUNFRC 0.91*    Estimated Creatinine Clearance: 86.2 ml/min (by C-G formula based on Cr of 1).   Medications:  Scheduled:  . aspirin  81 mg Oral Once  . aspirin EC  81 mg Oral Daily  . atorvastatin  40 mg Oral Daily  . citalopram  40 mg Oral Daily  . metoprolol succinate  50 mg Oral Daily  . pantoprazole  40 mg Oral Daily   Infusions:  . heparin 1,300 Units/hr (05/24/14 0000)    Assessment: 63 yo male with ACS is currently on supratherapeutic heparin.  Heparin level is 0.91. Goal of Therapy:  Heparin level 0.3-0.7 units/ml Monitor platelets by anticoagulation protocol: Yes   Plan:  1) Reduce heparin to 1150 units/hr.  2) Check 6hr heparin level or f/u after intervention today  Tsz-Yin Nyree Applegate 05/24/2014,8:30 AM

## 2014-05-25 ENCOUNTER — Encounter (HOSPITAL_COMMUNITY): Payer: Self-pay

## 2014-05-25 ENCOUNTER — Encounter (HOSPITAL_COMMUNITY): Payer: Self-pay | Admitting: Physician Assistant

## 2014-05-25 DIAGNOSIS — R079 Chest pain, unspecified: Secondary | ICD-10-CM | POA: Diagnosis present

## 2014-05-25 DIAGNOSIS — I2510 Atherosclerotic heart disease of native coronary artery without angina pectoris: Secondary | ICD-10-CM

## 2014-05-25 DIAGNOSIS — R9439 Abnormal result of other cardiovascular function study: Secondary | ICD-10-CM

## 2014-05-25 LAB — BASIC METABOLIC PANEL
BUN: 11 mg/dL (ref 6–23)
CALCIUM: 8.9 mg/dL (ref 8.4–10.5)
CO2: 27 mEq/L (ref 19–32)
CREATININE: 1.02 mg/dL (ref 0.50–1.35)
Chloride: 104 mEq/L (ref 96–112)
GFR, EST AFRICAN AMERICAN: 88 mL/min — AB (ref >90)
GFR, EST NON AFRICAN AMERICAN: 76 mL/min — AB (ref >90)
Glucose, Bld: 96 mg/dL (ref 70–99)
Potassium: 4.6 mEq/L (ref 3.7–5.3)
Sodium: 142 mEq/L (ref 137–147)

## 2014-05-25 LAB — CBC
HCT: 40.7 % (ref 39.0–52.0)
HEMOGLOBIN: 14.1 g/dL (ref 13.0–17.0)
MCH: 33.1 pg (ref 26.0–34.0)
MCHC: 34.6 g/dL (ref 30.0–36.0)
MCV: 95.5 fL (ref 78.0–100.0)
PLATELETS: 151 10*3/uL (ref 150–400)
RBC: 4.26 MIL/uL (ref 4.22–5.81)
RDW: 13.5 % (ref 11.5–15.5)
WBC: 5.4 10*3/uL (ref 4.0–10.5)

## 2014-05-25 MED ORDER — CLOPIDOGREL BISULFATE 75 MG PO TABS: 75.0000 mg | ORAL_TABLET | Freq: Every day | ORAL | Status: DC

## 2014-05-25 MED ORDER — PANTOPRAZOLE SODIUM 40 MG PO TBEC: 40.0000 mg | DELAYED_RELEASE_TABLET | Freq: Every day | ORAL | Status: DC

## 2014-05-25 MED FILL — Sodium Chloride IV Soln 0.9%: INTRAVENOUS | Qty: 50 | Status: AC

## 2014-05-25 NOTE — Progress Notes (Signed)
CARDIAC REHAB PHASE I   PRE:  Rate/Rhythm: 61 SR  BP:  Supine:   Sitting: 138/74  Standing:    SaO2:   MODE:  Ambulation: 800 ft   POST:  Rate/Rhythm: 77 SR  BP:  Supine:   Sitting: 154/76  Standing:    SaO2:  0820-0903 Pt walked 800 ft independently with steady gait. No CP. Pt has done CRP 2 and Maintenance program and gave permission to be referred to [facility] Phase 2. Education completed with brief review of diet and exercise. Encouraged pt to walk more frequently as he was walking 3 times a week. Reviewed NTG use, plavix. Understanding voiced.   Graylon Good, RN BSN  05/25/2014 9:01 AM

## 2014-05-25 NOTE — Discharge Summary (Signed)
Discharge Summary   Patient ID: [name] MRN: [id number], DOB/AGE: 64-11-1951 64 y.o. Admit date: 05/23/2014 D/C date:     05/25/2014  Primary Cardiologist: Dr. Marlou Porch  Active Problems:   Pure hypercholesterolemia   CAD (coronary artery disease)   Abnormal nuclear stress test   Angina decubitus   Ischemic cardiomyopathy   Hypertension   Chest pain    Discharge Diagnosis: Abnormal stress test s/p DES to LAD  HPI: [name] is a 63 y.o. male with CAD s/p DES to LAD in July of 2010 (following abnormal nuclear stress test), HLD, HTN and anxiety who presented for [facility] on 05/22/14 after an abnormal nuclear stress test demonstrating a moderate-sized defect along the mid to distal LAD territory/anterior/apical wall.   Hospital Course  CAD- LHC by Dr. Marlou Porch on 05/22/14 revealed a 99% subtotal mid LAD lesion distal to previously placed stent, which correlateed with the nuclear abnormality, as well as abormal left ventricular systolic function, LVEDP 10 mmHg, EF 50% with hypokinesis of the mid to distal anterior segment.  -- He was placed on heparin and referred for an interventional heart cath and underwent LHC on 05/24/14 with successful DES placement to mid LAD.   The patient has had an uncomplicated hospital course and is recovering well. The radial catheter site is stable. He has been seen by Dr. Irish Lack today and deemed ready for discharge home. All follow-up appointments have been scheduled. Discharge medications include ASA/plavix, statin. His nexium has been switched to protonix in the setting of Plavix therapy. Also, I did not give him a rx for NTG as he takes levitra for ED.    Discharge Vitals: Blood pressure 138/74, pulse 62, temperature 97.7 F (36.5 C), temperature source Oral, resp. rate 20, height 5\' 10"  (1.778 m), weight 203 lb 4.2 oz (92.2 kg), SpO2 99.00%.  Labs: Lab Results  Component Value Date   WBC 5.4 05/25/2014   HGB 14.1 05/25/2014   HCT 40.7  05/25/2014   MCV 95.5 05/25/2014   PLT 151 05/25/2014     Recent Labs Lab 05/25/14 0332  NA 142  K 4.6  CL 104  CO2 27  BUN 11  CREATININE 1.02  CALCIUM 8.9  GLUCOSE 96     Diagnostic Studies/Procedures   CARDIAC CATHETERIZATION 05/22/14 PROCEDURE: Left heart catheterization with selective coronary angiography, left ventriculogram via the radial artery approach.  INDICATIONS: 63 year old with coronary artery disease status post DES to LAD in July of 2010 with hyperlipidemia, hypertension, anxiety with recent abnormal nuclear stress test demonstrating moderate size defect along the mid to distal LAD territory/anterior/apical wall. After walking for approximately 10 minutes he is feeling chest pain substernal with no radiation. He tried isosorbide in the past but developed a rash.  The risks, benefits, and details of the procedure were explained to the patient, including possibilities of stroke, heart attack, death, renal impairment, arterial damage, bleeding. The patient verbalized understanding and wanted to proceed. Informed written consent was obtained.  PROCEDURE TECHNIQUE: Allen's test was performed pre-and post procedure and was normal. The right radial artery site was prepped and draped in a sterile fashion. One percent lidocaine was used for local anesthesia. Using the modified Seldinger technique a 5 French hydrophilic sheath was inserted into the radial artery without difficulty. 3 mg of verapamil was administered via the sheath. A Judkins right #4 catheter with the guidance of a Versicore wire was placed in the right coronary cusp and selectively cannulated the right coronary artery.  After traversing the aortic arch, 4000 units of heparin IV was administered. A Judkins left #3.5 catheter was used to selectively cannulate the left main artery. Multiple views with hand injection of Omnipaque were obtained. Catheter a pigtail catheter was used to cross into the left ventricle,  hemodynamics were obtained, and a left ventriculogram was performed in the RAO position with power injection. Following the procedure, sheath was removed, patient was hemodynamically stable, hemostasis was maintained with a Terumo T band.  CONTRAST: Total of 70 ml.  FLOUROSCOPY TIME: 2.0 min.  COMPLICATIONS: None.  HEMODYNAMICS: Aortic pressure was 578/46NGEX; LV systolic pressure was 528UXLK; LVEDP 72mmHg. There was no gradient between the left ventricle and aorta.  ANGIOGRAPHIC DATA:  Left main: No angiographically significant disease. Branches into LAD and circumflex.  Left anterior descending (LAD): Just proximal to previously placed stent there is 30% stenosis. Distal to the stent, in the mid vessel there is 99%, subtotal LAD with slow distal flow in the remaining portion of his LAD.  Circumflex artery (CIRC): No angiographically significant disease, tortuous.  Right coronary artery (RCA): No angiographically significant disease, gives rise to the posterior descending artery, dominant  LEFT VENTRICULOGRAM: Left ventricular angiogram was done in the 30 RAO projection and revealed hypokinesis of the mid to distal anterior wall with an estimated ejection fraction of 50%.  IMPRESSIONS:  1. 99%/subtotal mid LAD lesion distal to previously placed stent. This correlates with nuclear abnormality. 2. Abormal left ventricular systolic function. LVEDP 10 mmHg. Ejection fraction 50% with hypokinesis of the mid to distal anterior segment. RECOMMENDATION: Discussed with interventional cardiology, we will admit and loaded with Plavix 600 mg, placed on heparin 8 hours after sheath pull. Intervention will take place tomorrow.    05/24/14 : PERCUTANEOUS CORONARY INTERVENTION  [name] is a 63 y.o. male  INDICATION: High-risk myocardial perfusion study with limiting angina despite antianginal therapy.  PROCEDURE: DES mid LAD  CONSENT:  The risks, benefits, and details of the procedure were  explained to the patient. Risks including death, MI, stroke, bleeding, limb ischemia, emergency CABG, renal failure and allergy were described and accepted by the patient. Informed written consent was obtained prior to proceeding.  PROCEDURE TECHNIQUE: After Xylocaine anesthesia a 5 French Slender sheath was placed in the right radial artery with a single anterior needle wall stick. Coronary guiding shots were made using a 6 French 3.5 cm XB LAD, and ultimately a 3.0 cm XB LAD catheter. Antithrombotic therapy,bivaliruy, was begun and determined to be therapeutic by ACT. Antiplatelet therapy, bivalirudin, was loaded. The patient was loaded with Plavix 600 mg 24 hours prior to the procedure.  PCI was performed using a Pro-water guidewire. A predilatation with a 15 x 2.0 mm balloon was performed. We then positioned and deployed at 18 x 2.5 mm diameter Xience Alpine to 13 atmospheres. We then post dilated using a 15 x 2.75 [place] balloon to 14 atmospheres. A 0% stenosis was noted post dilatation.  The case was terminated and hemostasis achieved with Wrist Band.  CONTRAST: Total of 100 cc.  COMPLICATIONS: None  ANGIOGRAPHIC RESULTS: 99/100% mid LAD reduces 0% with TIMI grade 3 flow after angioplasty and DES implantation post dilated at 2.75 mm in diameter. Clear step up and step down was noted in the stented segment.  IMPRESSIONS: Successful DES implantation into a 99% mid LAD which was reduced to 0% with TIMI grade 3 flow.  RECOMMENDATION: Discharge in a.m. assuming no complications. Aspirin and Plavix indefinitely.Marland Kitchen   Discharge Medications  Medication List    STOP taking these medications       esomeprazole 20 MG capsule  Commonly known as:  [place] by:  pantoprazole 40 MG tablet      TAKE these medications       aspirin 81 MG tablet  Take 81 mg by mouth daily.     atorvastatin 40 MG tablet  Commonly known as:  LIPITOR  Take 1 tablet (40 mg total) by mouth daily.     citalopram  40 MG tablet  Commonly known as:  CELEXA  Take 1 tablet (40 mg total) by mouth daily.     clopidogrel 75 MG tablet  Commonly known as:  PLAVIX  Take 1 tablet (75 mg total) by mouth daily with breakfast.     fish oil-omega-3 fatty acids 1000 MG capsule  Take 1 g by mouth daily.     metoprolol succinate 50 MG 24 hr tablet  Commonly known as:  TOPROL-XL  Take 50 mg by mouth daily. Take with or immediately following a meal.     pantoprazole 40 MG tablet  Commonly known as:  PROTONIX  Take 1 tablet (40 mg total) by mouth daily.     vardenafil 20 MG tablet  Commonly known as:  LEVITRA  Take 20 mg by mouth daily as needed for erectile dysfunction.        Disposition   The patient will be discharged in stable condition to home. Discharge Instructions   Amb Referral to Cardiac Rehabilitation    Complete by:  As directed           Follow-up Information   Follow up with Richardson Dopp, PA-C On 06/12/2014. (@ 2:10pm)    Specialty:  Physician Assistant   Contact information:   [address] [address] [phone]         Duration of Discharge Encounter: Greater than 30 minutes including physician and PA time.  Signed, Perry Mount PA-C 05/25/2014, 10:22 AM   I have examined the patient and reviewed assessment and plan and discussed with patient.  Agree with above as stated.  DAPT for one year without interruption. F/u with Dr. Marlou Porch.  Jettie Booze

## 2014-05-25 NOTE — Discharge Instructions (Signed)

## 2014-05-25 NOTE — Progress Notes (Signed)
Patient Name: [name] Date of Encounter: 05/25/2014     Active Problems:   Pure hypercholesterolemia   CAD (coronary artery disease)   Abnormal nuclear stress test   Angina decubitus   Ischemic cardiomyopathy   Hypertension    SUBJECTIVE  No CP no SOB ready to go home. Will likely go home after walking. Radial site okay  CURRENT MEDS . aspirin EC  81 mg Oral Daily  . atorvastatin  80 mg Oral Daily  . citalopram  40 mg Oral Daily  . clopidogrel  75 mg Oral Q breakfast  . metoprolol succinate  50 mg Oral Daily  . pantoprazole  40 mg Oral Daily    OBJECTIVE  Filed Vitals:   05/24/14 2100 05/24/14 2352 05/25/14 0100 05/25/14 0620  BP: 139/57 125/57  140/67  Pulse: 54 61  64  Temp: 97.4 F (36.3 C) 98.6 F (37 C)  97.2 F (36.2 C)  TempSrc: Oral Oral  Oral  Resp: 18 18  20   Height:      Weight:   203 lb 4.2 oz (92.2 kg)   SpO2: 97% 98%  99%    Intake/Output Summary (Last 24 hours) at 05/25/14 0717 Last data filed at 05/24/14 2300  Gross per 24 hour  Intake   1305 ml  Output      0 ml  Net   1305 ml   Filed Weights   05/23/14 1102 05/24/14 0300 05/25/14 0100  Weight: 203 lb (92.08 kg) 202 lb 9.6 oz (91.9 kg) 203 lb 4.2 oz (92.2 kg)    PHYSICAL EXAM  General: Pleasant, NAD. Neuro: Alert and oriented X 3. Moves all extremities spontaneously. Psych: Normal affect. HEENT:  Normal  Neck: Supple without bruits or JVD. Lungs:  Resp regular and unlabored, CTA. Heart: RRR no s3, s4, or murmurs. Abdomen: Soft, non-tender, non-distended, BS + x 4.  Extremities: No clubbing, cyanosis or edema. DP/PT/Radials 2+ and equal bilaterally.  Accessory Clinical Findings  CBC  Recent Labs  05/24/14 0714 05/25/14 0332  WBC 5.3 5.4  HGB 14.7 14.1  HCT 41.8 40.7  MCV 94.6 95.5  PLT 155 324   Basic Metabolic Panel  Recent Labs  05/24/14 1000 05/25/14 0332  NA 139 142  K 4.2 4.6  CL 103 104  CO2 24 27  GLUCOSE 126* 96  BUN 12 11  CREATININE  0.82 1.02  CALCIUM 9.2 8.9    TELE  NSR  Radiology/Studies CARDIAC CATHETERIZATION  PROCEDURE: Left heart catheterization with selective coronary angiography, left ventriculogram via the radial artery approach.  INDICATIONS: 63 year old with coronary artery disease status post DES to LAD in July of 2010 with hyperlipidemia, hypertension, anxiety with recent abnormal nuclear stress test demonstrating moderate size defect along the mid to distal LAD territory/anterior/apical wall. After walking for approximately 10 minutes he is feeling chest pain substernal with no radiation. He tried isosorbide in the past but developed a rash.  The risks, benefits, and details of the procedure were explained to the patient, including possibilities of stroke, heart attack, death, renal impairment, arterial damage, bleeding. The patient verbalized understanding and wanted to proceed. Informed written consent was obtained.  PROCEDURE TECHNIQUE: Allen's test was performed pre-and post procedure and was normal. The right radial artery site was prepped and draped in a sterile fashion. One percent lidocaine was used for local anesthesia. Using the modified Seldinger technique a 5 French hydrophilic sheath was inserted into the radial artery without difficulty. 3 mg of verapamil  was administered via the sheath. A Judkins right #4 catheter with the guidance of a Versicore wire was placed in the right coronary cusp and selectively cannulated the right coronary artery. After traversing the aortic arch, 4000 units of heparin IV was administered. A Judkins left #3.5 catheter was used to selectively cannulate the left main artery. Multiple views with hand injection of Omnipaque were obtained. Catheter a pigtail catheter was used to cross into the left ventricle, hemodynamics were obtained, and a left ventriculogram was performed in the RAO position with power injection. Following the procedure, sheath was removed, patient was  hemodynamically stable, hemostasis was maintained with a Terumo T band.  CONTRAST: Total of 70 ml.  FLOUROSCOPY TIME: 2.0 min.  COMPLICATIONS: None.  HEMODYNAMICS: Aortic pressure was 962/95MWUX; LV systolic pressure was 324MWNU; LVEDP 9mmHg. There was no gradient between the left ventricle and aorta.  ANGIOGRAPHIC DATA:  Left main: No angiographically significant disease. Branches into LAD and circumflex.  Left anterior descending (LAD): Just proximal to previously placed stent there is 30% stenosis. Distal to the stent, in the mid vessel there is 99%, subtotal LAD with slow distal flow in the remaining portion of his LAD.  Circumflex artery (CIRC): No angiographically significant disease, tortuous.  Right coronary artery (RCA): No angiographically significant disease, gives rise to the posterior descending artery, dominant  LEFT VENTRICULOGRAM: Left ventricular angiogram was done in the 30 RAO projection and revealed hypokinesis of the mid to distal anterior wall with an estimated ejection fraction of 50%.  IMPRESSIONS:  1. 99%/subtotal mid LAD lesion distal to previously placed stent. This correlates with nuclear abnormality. 2. Abormal left ventricular systolic function. LVEDP 10 mmHg. Ejection fraction 50% with hypokinesis of the mid to distal anterior segment. RECOMMENDATION: Discussed with interventional cardiology, we will admit and loaded with Plavix 600 mg, placed on heparin 8 hours after sheath pull. Intervention will take place tomorrow.      PERCUTANEOUS CORONARY INTERVENTION  [name] is a 63 y.o. male  INDICATION: High-risk myocardial perfusion study with limiting angina despite antianginal therapy.  PROCEDURE: DES mid LAD  CONSENT:  The risks, benefits, and details of the procedure were explained to the patient. Risks including death, MI, stroke, bleeding, limb ischemia, emergency CABG, renal failure and allergy were described and accepted by the patient. Informed  written consent was obtained prior to proceeding.  PROCEDURE TECHNIQUE: After Xylocaine anesthesia a 5 French Slender sheath was placed in the right radial artery with a single anterior needle wall stick. Coronary guiding shots were made using a 6 French 3.5 cm XB LAD, and ultimately a 3.0 cm XB LAD catheter. Antithrombotic therapy,bivaliruy, was begun and determined to be therapeutic by ACT. Antiplatelet therapy, bivalirudin, was loaded. The patient was loaded with Plavix 600 mg 24 hours prior to the procedure.  PCI was performed using a Pro-water guidewire. A predilatation with a 15 x 2.0 mm balloon was performed. We then positioned and deployed at 18 x 2.5 mm diameter Xience Alpine to 13 atmospheres. We then post dilated using a 15 x 2.75 [place] balloon to 14 atmospheres. A 0% stenosis was noted post dilatation.  The case was terminated and hemostasis achieved with Wrist Band.  CONTRAST: Total of 100 cc.  COMPLICATIONS: None  ANGIOGRAPHIC RESULTS: 99/100% mid LAD reduces 0% with TIMI grade 3 flow after angioplasty and DES implantation post dilated at 2.75 mm in diameter. Clear step up and step down was noted in the stented segment.  IMPRESSIONS: Successful DES implantation  into a 99% mid LAD which was reduced to 0% with TIMI grade 3 flow.  RECOMMENDATION: Discharge in a.m. assuming no complications. Aspirin and Plavix indefinitely..    ASSESSMENT AND PLAN  [name] is a 63 y.o. male with CAD s/p DES to LAD in July of 2010 (following abnormal nuclear stress test), HLD, HTN and anxiety who presented for [facility] after an abnormal nuclear stress test demonstrating a moderate-sized defect along the mid to distal LAD territory/anterior/apical wall.   CAD- LHC by Dr. Marlou Porch 05/22/14 revealing 99% subtotal mid LAD lesion distal to previously placed stent. This correlates with nuclear abnormality. Abormal left ventricular systolic function. LVEDP 10 mmHg. Ejection fraction 50% with hypokinesis of the mid  to distal anterior segment. -- He underwent interventional heart cath yesterday with successful DES implantation into a 99% mid LAD which was reduced to 0% with TIMI grade 3 flow.    Signed, Perry Mount PA-C  Pager [phone]  I have examined the patient and reviewed assessment and plan and discussed with patient.  Agree with above as stated.  DAPT for one year.  [name]

## 2014-05-26 ENCOUNTER — Encounter (HOSPITAL_COMMUNITY): Payer: Self-pay

## 2014-05-26 ENCOUNTER — Ambulatory Visit (HOSPITAL_COMMUNITY): Payer: BC Managed Care – PPO

## 2014-06-01 ENCOUNTER — Telehealth: Payer: Self-pay | Admitting: Cardiology

## 2014-06-01 ENCOUNTER — Ambulatory Visit: Payer: BC Managed Care – PPO | Admitting: Cardiology

## 2014-06-01 NOTE — Telephone Encounter (Signed)
New message     Pt want to be enrolled in cardiac rehab.  Will Dr Marlou Porch ok this?  If yes,  [employer] needs recent stent placement records in order to approve cardiac rehab.  Pt said he has left this info with medical records and refuses to leave it again with them---he wanted to leave this info with the nurse.  [employer] customer service number is [phone] in case we need it.

## 2014-06-01 NOTE — Telephone Encounter (Signed)
Patient states he has requested previously that his recent stent placement records be provided ASAP to [facility] so they will approve his Cardiac Rehab. Their number is [phone]. Patient is frustrated that this has not been completed yet. Routed to Dr. Marlou Porch, Medical Records and Emeline Darling, RN.

## 2014-06-02 NOTE — Telephone Encounter (Signed)
Spoke with Dr Marlou Porch and cardiac rehab. No order has been sent to cardiac rehab, will send order. Advised patient that cardiac rehab should be in touch with him next week. Apologized for any inconvenience

## 2014-06-02 NOTE — Telephone Encounter (Signed)
?  Is this something that is on my cart ready for me to sign?   Please make sure that I have this form filled out and ready to sign. I will come by the office and sign if needed (in hospital today). Call me when ready (cell number is [phone]).  If all [employer] needs is stent records, please give this to them (fax the record of the cath/dc summ). Please call back the patient when done. Appreciate your help in getting this together for me.  Thanks.  Candee Furbish, MD

## 2014-06-12 ENCOUNTER — Telehealth: Payer: Self-pay | Admitting: Cardiology

## 2014-06-12 ENCOUNTER — Encounter: Payer: Self-pay | Admitting: Physician Assistant

## 2014-06-12 ENCOUNTER — Ambulatory Visit (INDEPENDENT_AMBULATORY_CARE_PROVIDER_SITE_OTHER): Payer: BC Managed Care – PPO | Admitting: Physician Assistant

## 2014-06-12 VITALS — BP 122/73 | HR 57 | Ht 70.0 in | Wt 203.0 lb

## 2014-06-12 DIAGNOSIS — K219 Gastro-esophageal reflux disease without esophagitis: Secondary | ICD-10-CM

## 2014-06-12 DIAGNOSIS — I2510 Atherosclerotic heart disease of native coronary artery without angina pectoris: Secondary | ICD-10-CM

## 2014-06-12 DIAGNOSIS — E7800 Pure hypercholesterolemia, unspecified: Secondary | ICD-10-CM

## 2014-06-12 DIAGNOSIS — I10 Essential (primary) hypertension: Secondary | ICD-10-CM

## 2014-06-12 NOTE — Telephone Encounter (Signed)
Request for outpt cardiac rehab w/clinicals faxed to [facility], [place]

## 2014-06-12 NOTE — Progress Notes (Signed)
Cardiology Office Note   Date:  06/12/2014   ID:  [name], DOB 01-Jul-1951, MRN [id number]  PCP:  Vikki Ports, MD  Cardiologist:  Dr. Candee Furbish      History of Present Illness: [name] is a 63 y.o. male with hx of CAD s/p DES to LAD in 06/2009, HTN, HL, anxiety.  He was seen recently for chest pain and a stress nuclear study was abnormal.  He was set up for cardiac catheterization.  This demonstrated mid LAD 99% stenosis distal to previous stent.  This was treated with a DES.  He returns for follow up.  He denies any further symptoms of angina. He has had increased symptoms of acid reflux. He attributes this to the addition of Plavix to his medical regimen as well as changing his Nexium to Protonix.  Overall, his symptoms are improving. He denies significant dyspnea. He does note some dyspnea with more extreme activities. He denies orthopnea, PND.  He has some mild ankle edema without significant change. He denies syncope.   Studies:  - LHC (05/23/14): LAD proximal to previously placed stent  30%, mid LAD distal to the stent 99%, CFX and RCA No CAD EF 50% with ant HK. PCI:  18 x 2.5 mm diameter Xience Alpine DES to mid LAD.  - Nuclear (04/26/14):  High risk stress nuclear study . There is a small but severe area of ischemia in the distal anterior wall. EF 46%. Ant HK.    Recent Labs: 11/03/2013: ALT 37; HDL Cholesterol by NMR 41; LDL (calc) 51  05/25/2014: Creatinine 1.02; Hemoglobin 14.1; Potassium 4.6   Wt Readings from Last 3 Encounters:  06/12/14 203 lb (92.08 kg)  05/25/14 203 lb 4.2 oz (92.2 kg)  05/25/14 203 lb 4.2 oz (92.2 kg)     Past Medical History  Diagnosis Date  . Hyperlipidemia   . CAD (coronary artery disease) 2010    a. s/p DES to LAD 06/2009. b. s/p DES to LAD for re-instent stenosis   . ED (erectile dysfunction)   . Irregular heartbeat     started on beta blocker by Dr. Marlou Porch, improved  . Shingles 2010  . Kidney stone 03/2010  . Prostate  cancer 01/2008     s/p brachiotherapy; Dr. Karsten Ro  . Hypertension     a. H/o outpatient hypotension.  Marland Kitchen GERD (gastroesophageal reflux disease)   . Ischemic cardiomyopathy     a. 04/2014: EF 50% with hypokinesis of the mid to distal anterior segment.     Current Outpatient Prescriptions  Medication Sig Dispense Refill  . aspirin 81 MG tablet Take 81 mg by mouth daily.        Marland Kitchen atorvastatin (LIPITOR) 40 MG tablet Take 1 tablet (40 mg total) by mouth daily.  90 tablet  1  . citalopram (CELEXA) 40 MG tablet Take 1 tablet (40 mg total) by mouth daily.  90 tablet  3  . clopidogrel (PLAVIX) 75 MG tablet Take 1 tablet (75 mg total) by mouth daily with breakfast.  30 tablet  11  . fish oil-omega-3 fatty acids 1000 MG capsule Take 1 g by mouth daily.        . metoprolol succinate (TOPROL-XL) 50 MG 24 hr tablet Take 50 mg by mouth daily. Take with or immediately following a meal.      . pantoprazole (PROTONIX) 40 MG tablet Take 1 tablet (40 mg total) by mouth daily.  30 tablet  11  . vardenafil (LEVITRA)  20 MG tablet Take 20 mg by mouth daily as needed for erectile dysfunction.        No current facility-administered medications for this visit.    Allergies:   Imdur   Social History:  The patient  reports that he has quit smoking. His smoking use included Cigarettes, Pipe, and Cigars. He smoked 0.00 packs per day. He has never used smokeless tobacco. He reports that he drinks about 3 ounces of alcohol per week. He reports that he does not use illicit drugs.   Family History:  The patient's family history includes Cancer in his daughter, mother, and paternal grandfather; Cancer (age of onset: 62) in his father; Cardiomyopathy in his daughter; Eating disorder in his mother; Heart disease in his daughter; Heart disease (age of onset: 65) in his father; Hodgkin's lymphoma in his daughter; Hyperlipidemia in his father; Hypertension in his father. There is no history of Diabetes.   ROS:  Please see the  history of present illness.   He denies any bleeding problems.   All other systems reviewed and negative.   PHYSICAL EXAM: VS:  BP 122/73  Pulse 57  Ht 5\' 10"  (1.778 m)  Wt 203 lb (92.08 kg)  BMI 29.13 kg/m2 Well nourished, well developed, in no acute distress HEENT: normal Neck: no JVD Cardiac:  normal S1, S2; RRR; no murmur Lungs:  clear to auscultation bilaterally, no wheezing, rhonchi or rales Abd: soft, nontender, no hepatomegaly Ext: no edemaright wrist without hematoma or mass Skin: warm and dry Neuro:  CNs 2-12 intact, no focal abnormalities noted  EKG:  Sinus bradycardia, HR 57, nonspecific ST-T wave changes     ASSESSMENT AND PLAN:  1. CAD (coronary artery disease): He is doing well after recent drug-eluting stent to the LAD. He is set to start cardiac rehabilitation soon. We discussed the importance of dual antiplatelet therapy for the next one year. Continue aspirin, Plavix, statin, beta blocker. 2. Hypertension: Controlled. 3. Pure hypercholesterolemia: Continue statin. 4. GERD (gastroesophageal reflux disease):  His acid reflux was exacerbated by the addition of Plavix to his medical regimen and changing his Nexium to Protonix. This is improving. I suggested increasing his Protonix to twice a day for one to 2 weeks if needed. 5. Disposition: Followup with Dr. Marlou Porch in 1-2 months.   Signed, Versie Starks, MHS 06/12/2014 1:01 PM    [facility] [address], [place], [place]  [address] Phone: [phone]; Fax: [phone]

## 2014-06-12 NOTE — Patient Instructions (Signed)
No change in your medications. You can try to increase the Protonix to twice a day for 1-2 weeks to see if this helps. If your indigestion continues to be worse, call us back. Follow up with Dr. Candee Furbish in 2-3 mos.

## 2014-06-19 ENCOUNTER — Ambulatory Visit: Payer: BC Managed Care – PPO | Admitting: Cardiology

## 2014-06-22 ENCOUNTER — Encounter (HOSPITAL_COMMUNITY)
Admission: RE | Admit: 2014-06-22 | Discharge: 2014-06-22 | Disposition: A | Discharge status: Home / Self Care | Payer: BC Managed Care – PPO | Source: Ambulatory Visit | Attending: Cardiology | Admitting: Cardiology

## 2014-06-22 ENCOUNTER — Telehealth (HOSPITAL_COMMUNITY): Payer: Self-pay | Admitting: *Deleted

## 2014-06-22 NOTE — Progress Notes (Signed)
Cardiac Rehab Medication Review by a Pharmacist  Does the patient  feel that his/her medications are working for him/her?  Yes (though says the Protonix is less effective compared to the Nexium he used to take)  Has the patient been experiencing any side effects to the medications prescribed?  no  Does the patient measure his/her own blood pressure or blood glucose at home?  yes   Does the patient have any problems obtaining medications due to transportation or finances?   no  Understanding of regimen: good Understanding of indications: good Potential of compliance: excellent    Pharmacist comments: Mr. Buffalo describes a good understanding of his medications and how to take them. He uses a pillbox to organize his medications, and says he doesn't miss any doses. He monitors his blood pressure at home. He asked if taking Benadryl at night was okay, and we discussed side effects to be aware of and to be careful when driving the next day in case there are residual effects. All questions were addressed at this encounter.    Sharolyn Weber C. Missy Baksh, PharmD Clinical Pharmacist-Resident Pager: [phone] Pharmacy: [phone] 06/22/2014 8:12 AM

## 2014-06-22 NOTE — Telephone Encounter (Signed)
Message left to please contact cardiac rehab for update of insurance requirements for reimbursement.  Contact information given.

## 2014-07-03 ENCOUNTER — Encounter (HOSPITAL_COMMUNITY): Payer: BC Managed Care – PPO

## 2014-07-03 ENCOUNTER — Encounter (HOSPITAL_COMMUNITY)
Admission: RE | Admit: 2014-07-03 | Discharge: 2014-07-03 | Disposition: A | Discharge status: Home / Self Care | Payer: BC Managed Care – PPO | Source: Ambulatory Visit | Attending: Cardiology | Admitting: Cardiology

## 2014-07-03 DIAGNOSIS — Z9861 Coronary angioplasty status: Secondary | ICD-10-CM | POA: Insufficient documentation

## 2014-07-03 NOTE — Progress Notes (Signed)
Pt started cardiac rehab today.  Pt tolerated light exercise without difficulty. Telemetry rhythm Sinus. Vital signs stable. Will continue to monitor the patient throughout  the program.  

## 2014-07-04 ENCOUNTER — Ambulatory Visit: Payer: BC Managed Care – PPO | Admitting: Cardiology

## 2014-07-05 ENCOUNTER — Encounter (HOSPITAL_COMMUNITY)
Admission: RE | Admit: 2014-07-05 | Discharge: 2014-07-05 | Disposition: A | Discharge status: Home / Self Care | Payer: BC Managed Care – PPO | Source: Ambulatory Visit | Attending: Cardiology | Admitting: Cardiology

## 2014-07-05 ENCOUNTER — Encounter (HOSPITAL_COMMUNITY): Payer: BC Managed Care – PPO

## 2014-07-07 ENCOUNTER — Encounter (HOSPITAL_COMMUNITY): Payer: BC Managed Care – PPO

## 2014-07-07 ENCOUNTER — Encounter (HOSPITAL_COMMUNITY)
Admission: RE | Admit: 2014-07-07 | Discharge: 2014-07-07 | Disposition: A | Discharge status: Home / Self Care | Payer: BC Managed Care – PPO | Source: Ambulatory Visit | Attending: Cardiology | Admitting: Cardiology

## 2014-07-07 NOTE — Progress Notes (Signed)
Briefly reviewed home exercise with pt today.  Pt plans to walk at home for exercise.  Reviewed THR, pulse, RPE, sign and symptoms, NTG use, and when to call 911 or MD.  Pt voiced understanding. Pt says that he will bring in his homework on Monday. Alberteen Sam, MA, ACSM RCEP

## 2014-07-10 ENCOUNTER — Encounter (HOSPITAL_COMMUNITY)
Admission: RE | Admit: 2014-07-10 | Discharge: 2014-07-10 | Disposition: A | Discharge status: Home / Self Care | Payer: BC Managed Care – PPO | Source: Ambulatory Visit | Attending: Cardiology | Admitting: Cardiology

## 2014-07-10 ENCOUNTER — Encounter (HOSPITAL_COMMUNITY): Payer: BC Managed Care – PPO

## 2014-07-12 ENCOUNTER — Encounter (HOSPITAL_COMMUNITY)
Admission: RE | Admit: 2014-07-12 | Discharge: 2014-07-12 | Disposition: A | Discharge status: Home / Self Care | Payer: BC Managed Care – PPO | Source: Ambulatory Visit | Attending: Cardiology | Admitting: Cardiology

## 2014-07-12 ENCOUNTER — Encounter (HOSPITAL_COMMUNITY): Payer: BC Managed Care – PPO

## 2014-07-14 ENCOUNTER — Encounter (HOSPITAL_COMMUNITY): Payer: BC Managed Care – PPO

## 2014-07-14 ENCOUNTER — Encounter (HOSPITAL_COMMUNITY)
Admission: RE | Admit: 2014-07-14 | Discharge: 2014-07-14 | Disposition: A | Discharge status: Home / Self Care | Payer: BC Managed Care – PPO | Source: Ambulatory Visit | Attending: Cardiology | Admitting: Cardiology

## 2014-07-17 ENCOUNTER — Encounter (HOSPITAL_COMMUNITY): Payer: BC Managed Care – PPO

## 2014-07-19 ENCOUNTER — Encounter (HOSPITAL_COMMUNITY): Payer: BC Managed Care – PPO

## 2014-07-21 ENCOUNTER — Encounter (HOSPITAL_COMMUNITY): Payer: BC Managed Care – PPO

## 2014-07-21 ENCOUNTER — Encounter (HOSPITAL_COMMUNITY)
Admission: RE | Admit: 2014-07-21 | Discharge: 2014-07-21 | Disposition: A | Discharge status: Home / Self Care | Payer: BC Managed Care – PPO | Source: Ambulatory Visit | Attending: Cardiology | Admitting: Cardiology

## 2014-07-24 ENCOUNTER — Encounter: Payer: Self-pay | Admitting: Medical

## 2014-07-24 ENCOUNTER — Ambulatory Visit (INDEPENDENT_AMBULATORY_CARE_PROVIDER_SITE_OTHER): Payer: BC Managed Care – PPO | Admitting: Medical

## 2014-07-24 ENCOUNTER — Encounter (HOSPITAL_COMMUNITY): Payer: BC Managed Care – PPO

## 2014-07-24 ENCOUNTER — Telehealth: Payer: Self-pay | Admitting: Physician Assistant

## 2014-07-24 ENCOUNTER — Telehealth (HOSPITAL_COMMUNITY): Payer: Self-pay | Admitting: Cardiac Rehabilitation

## 2014-07-24 ENCOUNTER — Encounter (HOSPITAL_COMMUNITY)
Admission: RE | Admit: 2014-07-24 | Discharge: 2014-07-24 | Disposition: A | Discharge status: Home / Self Care | Payer: BC Managed Care – PPO | Source: Ambulatory Visit | Attending: Cardiology | Admitting: Cardiology

## 2014-07-24 VITALS — BP 130/80 | HR 60 | Temp 97.4°F | Resp 14 | Wt 200.0 lb

## 2014-07-24 DIAGNOSIS — M545 Low back pain, unspecified: Secondary | ICD-10-CM

## 2014-07-24 DIAGNOSIS — R109 Unspecified abdominal pain: Secondary | ICD-10-CM

## 2014-07-24 LAB — POCT URINALYSIS DIPSTICK
Bilirubin, UA: NEGATIVE
Glucose, UA: NEGATIVE
Ketones, UA: NEGATIVE
LEUKOCYTES UA: NEGATIVE
NITRITE UA: NEGATIVE
Spec Grav, UA: 1.015
UROBILINOGEN UA: NEGATIVE
pH, UA: 5

## 2014-07-24 MED ORDER — METOPROLOL SUCCINATE ER 25 MG PO TB24: 25.0000 mg | ORAL_TABLET | Freq: Every day | ORAL | Status: DC

## 2014-07-24 NOTE — Addendum Note (Signed)
Addended by: Shellia Cleverly on: 07/24/2014 06:17 PM   Modules accepted: Orders, Medications

## 2014-07-24 NOTE — Progress Notes (Signed)
I reviewed [name]'s quality of life questionnaire with him this morning. [name] has low scores overall in health and functioning and family. [name] denies feeling depressed but reports not having a lot of energy since his stent placement. [name] denies having any chest pain.  Will forward [name]'s quality of life questionnaire  For Dr Marlou Porch to review and will let him about his energy level.

## 2014-07-24 NOTE — Telephone Encounter (Signed)
Patient can try to decrease Toprol-XL from 50 mg to 25 mg QD to see if this helps.  If it does not help, he will need follow up to evaluate fatigue further. Richardson Dopp, PA-C   07/24/2014 5:14 PM

## 2014-07-24 NOTE — Telephone Encounter (Signed)
Message copied by Liliane Shi on Mon Jul 24, 2014  5:13 PM ------      Message from: Magda Kiel      Created: Mon Jul 24, 2014 11:50 AM      Regarding: lack of energy       Dr Marlou Porch            I want it to your attention that Mr [name] reported to me today that he has not had a lot of energy since his stent placement. [name] has not had any chest pain he is on metoprolol. [name] is working part time and has his next follow up appointment with Dr Marlou Porch in September. I faxed his blood pressure which has been stable over for review.                  Thanks I just want to make you aware of his compaint.                  I forwarded this to Richardson Dopp [facility] since it looks like Dr Marlou Porch is on vacation.   ------

## 2014-07-24 NOTE — Progress Notes (Signed)
Subjective:   [name] is a 63 y.o. male presenting on 07/24/2014 with Back Pain and Abdominal Pain  Normally sees Dr. Tomi Fuller here.   Has been having abdominal discomfort and back pain that started over a week ago.  The first day had nausea, vomiting, bad pain, but this was the worse day. Since then the pain has improved, just has a little residual pain now.   Worried initially about renal stones.  Wanted to see what is going on.  He reports currently RLQ pain, mild intermittent the last few days.   Had some constipation the first few days.   Denies fever, but has had some nausea, but none in a week.   Had vomiting 8 days ago.   No diarrhea.  Urine without blood, cloudy or odor.   No changes in urine stream, no burning with urination.  No prior urinary tract infection.   Married.  No concern for STD.   No blood in urine or stool.   No injury or trauma.  Appetite is fine.  No rash.  No problems with bowel movements.   As of today, pain is only mild intermittent at this time, hasn't been severe in general.  Has had right inguinal hernia surgery years ago, but no other abdominal surgery.  No sick contacts with similar symptoms.  Hx/o prostate cancer treated with radioactive pellet. No other aggravating or relieving factors.  No other complaint.    Review of Systems ROS as in subjective  Past Medical History  Diagnosis Date  . Hyperlipidemia   . ED (erectile dysfunction)   . Irregular heartbeat     started on beta blocker by Dr. Marlou Porch, improved  . Shingles 2010  . Kidney stone 03/2010  . Prostate cancer 01/2008     s/p brachiotherapy; Dr. Karsten Ro  . GERD (gastroesophageal reflux disease)   . CAD (coronary artery disease) 2010    a. s/p DES to LAD 06/2009. b. LHC (05/23/14): LAD proximal to previously placed stent  30%, mid LAD distal to the stent 99%, CFX and RCA No CAD EF 50% with ant HK. PCI:  18 x 2.5 mm diameter Xience Alpine DES to mid LAD.  [name] Hypertension    Past Surgical History   Procedure Laterality Date  . Colonoscopy  2005  . Vasectomy    . Radioactive seed implant  01/2008    prostate cancer  . Coronary angioplasty with stent placement  06/2009    "1"  . Cardiac catheterization  05/23/2014  . Inguinal hernia repair Left 10/1998        Objective:     Filed Vitals:   07/24/14 1347  BP: 130/80  Pulse: 60  Temp: 97.4 F (36.3 C)  Resp: 14    General appearance: alert, no distress, WD/WN Neck: supple, no lymphadenopathy, no thyromegaly, no masses Heart: RRR, normal S1, S2, no murmurs Lungs: CTA bilaterally, no wheezes, rhonchi, or rales Abdomen: +bs, soft, non tender, non distended, no masses, no hepatomegaly, no splenomegaly Back: nontender Pulses: 2+ symmetric, upper and lower extremities, normal cap refill Ext: no edema GU: Normal male genitalia, circumcised, left scrotal fullness suggestive of hydrocele (unchanged per patient), no other mass hernia or tenderness, right inguinal hernia surgical scar is present     Assessment: Encounter Diagnoses  Name Primary?  . Abdominal pain, unspecified site Yes  . Right-sided low back pain without sciatica      Plan: At this point his symptoms are much improved compared to  a week ago, his exam is mostly unremarkable today, his urinalysis was reviewed.   Urine micro shows <3RBC per field, no other abnormality.   We discussed numerous possible causes of belly pain, likely just had some type of viral gastroenteritis. Reassured that no symptoms or exam today suggest active moving kidney stone,  Appendicitis, mesenteric ischemia or anything too worrisome.  Not a lot of justification for labs or imaging at this time, however if the pain lingers he may need to check some things.  Will use a watch and wait approach.  [name] was seen today for back pain and abdominal pain.  Diagnoses and associated orders for this visit:  Abdominal pain, unspecified site - POCT urinalysis dipstick  Right-sided low  back pain without sciatica - POCT urinalysis dipstick    Return if symptoms worsen or fail to improve.

## 2014-07-24 NOTE — Telephone Encounter (Signed)
Pt has been only taking 25 mg a day of Toprol.  He will continue as is for now, keep his f/u as planned and call back if s/s worsen.

## 2014-07-25 ENCOUNTER — Telehealth: Payer: Self-pay | Admitting: *Deleted

## 2014-07-25 ENCOUNTER — Telehealth (HOSPITAL_COMMUNITY): Payer: Self-pay | Admitting: *Deleted

## 2014-07-25 NOTE — Telephone Encounter (Signed)
Pt aware to stop his Metoprolol per Dr Marlou Porch order.  He is aware if this doesn't improve his fatigue he should f/u with his PCP.

## 2014-07-25 NOTE — Telephone Encounter (Signed)
Message copied by Shellia Cleverly on Tue Jul 25, 2014 10:22 AM ------      Message from: Jerline Pain      Created: Tue Jul 25, 2014  8:40 AM      Regarding: FW: lack of energy       OK to stop metoprolol 25 due to lack of energy. If this does not solve issue, please have him discuss further with PCP. Thanks.       Candee Furbish, MD            ----- Message -----         From: Magda Kiel, RN         Sent: 07/24/2014  11:50 AM           To: Candee Furbish, MD, Ellwood Dense, RN, #      Subject: lack of energy                                           Dr Marlou Porch            I want it to your attention that Mr [name] reported to me today that he has not had a lot of energy since his stent placement. [name] has not had any chest pain he is on metoprolol. [name] is working part time and has his next follow up appointment with Dr Marlou Porch in September. I faxed his blood pressure which has been stable over for review.                  Thanks I just want to make you aware of his compaint.                  I forwarded this to Richardson Dopp [facility] since it looks like Dr Marlou Porch is on vacation.         ------

## 2014-07-26 ENCOUNTER — Encounter (HOSPITAL_COMMUNITY): Payer: BC Managed Care – PPO

## 2014-07-26 ENCOUNTER — Encounter (HOSPITAL_COMMUNITY)
Admission: RE | Admit: 2014-07-26 | Discharge: 2014-07-26 | Disposition: A | Discharge status: Home / Self Care | Payer: BC Managed Care – PPO | Source: Ambulatory Visit | Attending: Cardiology | Admitting: Cardiology

## 2014-07-28 ENCOUNTER — Encounter (HOSPITAL_COMMUNITY): Payer: BC Managed Care – PPO

## 2014-07-31 ENCOUNTER — Encounter (HOSPITAL_COMMUNITY)
Admission: RE | Admit: 2014-07-31 | Discharge: 2014-07-31 | Disposition: A | Discharge status: Home / Self Care | Payer: BC Managed Care – PPO | Source: Ambulatory Visit | Attending: Cardiology | Admitting: Cardiology

## 2014-07-31 ENCOUNTER — Encounter (HOSPITAL_COMMUNITY): Payer: BC Managed Care – PPO

## 2014-07-31 DIAGNOSIS — Z9861 Coronary angioplasty status: Secondary | ICD-10-CM | POA: Insufficient documentation

## 2014-07-31 NOTE — Progress Notes (Signed)
[name] 63 y.o. male Nutrition Note Spoke with pt. Pt known to this Probation officer from previous admission. Nutrition Survey reviewed with pt. Pt is working toward following the Therapeutic Lifestyle Changes diet. Pt wants to lose wt. Pt has been trying to lose wt. Wt today 91.5 kg, which is down 0.4 kg over the past few weeks. Wt loss tips reviewed. Pt expressed understanding of the information reviewed. Pt aware of nutrition education classes offered and is unable to attend nutrition classes.  Nutrition Diagnosis   Food-and nutrition-related knowledge deficit related to lack of exposure to information as related to diagnosis of: ? CVD    Overweight related to excessive energy intake as evidenced by a BMI of 28.9  Nutrition Intervention   Benefits of adopting Therapeutic Lifestyle Changes discussed when Medficts reviewed.   Pt to attend the Portion Distortion class   Pt given handouts for: ? Nutrition I class ? Nutrition II class   Continue client-centered nutrition education by RD, as part of interdisciplinary care.  Goal(s)   Pt to identify and limit food sources of saturated fat, trans fat, and cholesterol   Pt to identify food quantities necessary to achieve: ? wt loss to a goal wt of 178-196 lb (81.0-89.2 kg) at graduation from cardiac rehab.   Monitor and Evaluate progress toward nutrition goal with team. Nutrition Risk:  Low   Derek Mound, M.Ed, RD, LDN, CDE 07/31/2014 10:48 AM

## 2014-08-02 ENCOUNTER — Encounter (HOSPITAL_COMMUNITY): Payer: BC Managed Care – PPO

## 2014-08-02 ENCOUNTER — Encounter (HOSPITAL_COMMUNITY)
Admission: RE | Admit: 2014-08-02 | Discharge: 2014-08-02 | Disposition: A | Discharge status: Home / Self Care | Payer: BC Managed Care – PPO | Source: Ambulatory Visit | Attending: Cardiology | Admitting: Cardiology

## 2014-08-02 DIAGNOSIS — Z9861 Coronary angioplasty status: Secondary | ICD-10-CM | POA: Diagnosis not present

## 2014-08-04 ENCOUNTER — Encounter (HOSPITAL_COMMUNITY): Payer: BC Managed Care – PPO

## 2014-08-04 ENCOUNTER — Other Ambulatory Visit: Payer: Self-pay | Admitting: Family Medicine

## 2014-08-04 NOTE — Telephone Encounter (Signed)
Dr.Knapp is this okay to refill 

## 2014-08-04 NOTE — Telephone Encounter (Signed)
Got refill request for xanax  I believe he sees Dr. Tomi Bamberger, but I saw him for acute visit in July.  I can't see where he has gotten it recently, so if he is ok, I'll defer this to Dr. Tomi Bamberger for Monday

## 2014-08-04 NOTE — Telephone Encounter (Signed)
DR.LALONDE IS THIS OKAY TO REFILL

## 2014-08-04 NOTE — Telephone Encounter (Signed)
Per his 10/2013 CPE, he uses xanax very infrequently (about once a month). Okay to refill #30

## 2014-08-07 ENCOUNTER — Encounter (HOSPITAL_COMMUNITY): Payer: BC Managed Care – PPO

## 2014-08-07 ENCOUNTER — Encounter (HOSPITAL_COMMUNITY)
Admission: RE | Admit: 2014-08-07 | Discharge: 2014-08-07 | Disposition: A | Discharge status: Home / Self Care | Payer: BC Managed Care – PPO | Source: Ambulatory Visit | Attending: Cardiology | Admitting: Cardiology

## 2014-08-07 DIAGNOSIS — Z9861 Coronary angioplasty status: Secondary | ICD-10-CM | POA: Diagnosis not present

## 2014-08-09 ENCOUNTER — Encounter (HOSPITAL_COMMUNITY)
Admission: RE | Admit: 2014-08-09 | Discharge: 2014-08-09 | Disposition: A | Discharge status: Home / Self Care | Payer: BC Managed Care – PPO | Source: Ambulatory Visit | Attending: Cardiology | Admitting: Cardiology

## 2014-08-09 ENCOUNTER — Encounter (HOSPITAL_COMMUNITY): Payer: BC Managed Care – PPO

## 2014-08-09 DIAGNOSIS — Z9861 Coronary angioplasty status: Secondary | ICD-10-CM | POA: Diagnosis not present

## 2014-08-11 ENCOUNTER — Encounter (HOSPITAL_COMMUNITY)
Admission: RE | Admit: 2014-08-11 | Discharge: 2014-08-11 | Disposition: A | Discharge status: Home / Self Care | Payer: BC Managed Care – PPO | Source: Ambulatory Visit | Attending: Cardiology | Admitting: Cardiology

## 2014-08-11 ENCOUNTER — Encounter (HOSPITAL_COMMUNITY): Payer: BC Managed Care – PPO

## 2014-08-11 DIAGNOSIS — Z9861 Coronary angioplasty status: Secondary | ICD-10-CM | POA: Diagnosis not present

## 2014-08-14 ENCOUNTER — Encounter (HOSPITAL_COMMUNITY): Payer: BC Managed Care – PPO

## 2014-08-14 ENCOUNTER — Encounter (HOSPITAL_COMMUNITY)
Admission: RE | Admit: 2014-08-14 | Discharge: 2014-08-14 | Disposition: A | Discharge status: Home / Self Care | Payer: BC Managed Care – PPO | Source: Ambulatory Visit | Attending: Cardiology | Admitting: Cardiology

## 2014-08-14 DIAGNOSIS — Z9861 Coronary angioplasty status: Secondary | ICD-10-CM | POA: Diagnosis not present

## 2014-08-16 ENCOUNTER — Encounter (HOSPITAL_COMMUNITY): Payer: BC Managed Care – PPO

## 2014-08-16 ENCOUNTER — Encounter (HOSPITAL_COMMUNITY)
Admission: RE | Admit: 2014-08-16 | Discharge: 2014-08-16 | Disposition: A | Discharge status: Home / Self Care | Payer: BC Managed Care – PPO | Source: Ambulatory Visit | Attending: Cardiology | Admitting: Cardiology

## 2014-08-16 DIAGNOSIS — Z9861 Coronary angioplasty status: Secondary | ICD-10-CM | POA: Diagnosis not present

## 2014-08-18 ENCOUNTER — Encounter (HOSPITAL_COMMUNITY): Payer: BC Managed Care – PPO

## 2014-08-18 ENCOUNTER — Encounter (HOSPITAL_COMMUNITY)
Admission: RE | Admit: 2014-08-18 | Discharge: 2014-08-18 | Disposition: A | Discharge status: Home / Self Care | Payer: BC Managed Care – PPO | Source: Ambulatory Visit | Attending: Cardiology | Admitting: Cardiology

## 2014-08-18 DIAGNOSIS — Z9861 Coronary angioplasty status: Secondary | ICD-10-CM | POA: Diagnosis not present

## 2014-08-21 ENCOUNTER — Encounter (HOSPITAL_COMMUNITY): Payer: BC Managed Care – PPO

## 2014-08-23 ENCOUNTER — Encounter (HOSPITAL_COMMUNITY)
Admission: RE | Admit: 2014-08-23 | Discharge: 2014-08-23 | Disposition: A | Discharge status: Home / Self Care | Payer: BC Managed Care – PPO | Source: Ambulatory Visit | Attending: Cardiology | Admitting: Cardiology

## 2014-08-23 ENCOUNTER — Encounter (HOSPITAL_COMMUNITY): Payer: BC Managed Care – PPO

## 2014-08-23 DIAGNOSIS — Z9861 Coronary angioplasty status: Secondary | ICD-10-CM | POA: Diagnosis not present

## 2014-08-25 ENCOUNTER — Encounter (HOSPITAL_COMMUNITY)
Admission: RE | Admit: 2014-08-25 | Discharge: 2014-08-25 | Disposition: A | Discharge status: Home / Self Care | Payer: BC Managed Care – PPO | Source: Ambulatory Visit | Attending: Cardiology | Admitting: Cardiology

## 2014-08-25 ENCOUNTER — Encounter (HOSPITAL_COMMUNITY): Payer: BC Managed Care – PPO

## 2014-08-25 DIAGNOSIS — Z9861 Coronary angioplasty status: Secondary | ICD-10-CM | POA: Diagnosis not present

## 2014-08-28 ENCOUNTER — Encounter (HOSPITAL_COMMUNITY): Payer: BC Managed Care – PPO

## 2014-08-28 ENCOUNTER — Encounter (HOSPITAL_COMMUNITY)
Admission: RE | Admit: 2014-08-28 | Discharge: 2014-08-28 | Disposition: A | Discharge status: Home / Self Care | Payer: BC Managed Care – PPO | Source: Ambulatory Visit | Attending: Cardiology | Admitting: Cardiology

## 2014-08-28 DIAGNOSIS — Z9861 Coronary angioplasty status: Secondary | ICD-10-CM | POA: Diagnosis not present

## 2014-08-29 DIAGNOSIS — K4090 Unilateral inguinal hernia, without obstruction or gangrene, not specified as recurrent: Secondary | ICD-10-CM

## 2014-08-29 HISTORY — DX: Unilateral inguinal hernia, without obstruction or gangrene, not specified as recurrent: K40.90

## 2014-08-30 ENCOUNTER — Encounter (HOSPITAL_COMMUNITY): Payer: BC Managed Care – PPO

## 2014-08-30 ENCOUNTER — Encounter (HOSPITAL_COMMUNITY)
Admission: RE | Admit: 2014-08-30 | Discharge: 2014-08-30 | Disposition: A | Discharge status: Home / Self Care | Payer: BC Managed Care – PPO | Source: Ambulatory Visit | Attending: Cardiology | Admitting: Cardiology

## 2014-08-30 ENCOUNTER — Encounter: Payer: Self-pay | Admitting: Family Medicine

## 2014-08-30 ENCOUNTER — Ambulatory Visit (INDEPENDENT_AMBULATORY_CARE_PROVIDER_SITE_OTHER): Payer: BC Managed Care – PPO | Admitting: Family Medicine

## 2014-08-30 VITALS — BP 150/80 | HR 64 | Temp 97.8°F | Ht 70.0 in | Wt 195.0 lb

## 2014-08-30 DIAGNOSIS — Z9861 Coronary angioplasty status: Secondary | ICD-10-CM | POA: Diagnosis present

## 2014-08-30 DIAGNOSIS — Z1211 Encounter for screening for malignant neoplasm of colon: Secondary | ICD-10-CM

## 2014-08-30 DIAGNOSIS — R140 Abdominal distension (gaseous): Secondary | ICD-10-CM

## 2014-08-30 DIAGNOSIS — R142 Eructation: Secondary | ICD-10-CM

## 2014-08-30 DIAGNOSIS — R1031 Right lower quadrant pain: Secondary | ICD-10-CM

## 2014-08-30 DIAGNOSIS — R143 Flatulence: Secondary | ICD-10-CM

## 2014-08-30 DIAGNOSIS — R109 Unspecified abdominal pain: Secondary | ICD-10-CM

## 2014-08-30 DIAGNOSIS — Z23 Encounter for immunization: Secondary | ICD-10-CM

## 2014-08-30 DIAGNOSIS — R141 Gas pain: Secondary | ICD-10-CM

## 2014-08-30 LAB — CBC WITH DIFFERENTIAL/PLATELET
BASOS PCT: 0 % (ref 0–1)
Basophils Absolute: 0 10*3/uL (ref 0.0–0.1)
Eosinophils Absolute: 0.1 10*3/uL (ref 0.0–0.7)
Eosinophils Relative: 2 % (ref 0–5)
HCT: 39.9 % (ref 39.0–52.0)
Hemoglobin: 13.7 g/dL (ref 13.0–17.0)
Lymphocytes Relative: 23 % (ref 12–46)
Lymphs Abs: 1.2 10*3/uL (ref 0.7–4.0)
MCH: 31.8 pg (ref 26.0–34.0)
MCHC: 34.3 g/dL (ref 30.0–36.0)
MCV: 92.6 fL (ref 78.0–100.0)
MONOS PCT: 8 % (ref 3–12)
Monocytes Absolute: 0.4 10*3/uL (ref 0.1–1.0)
NEUTROS PCT: 67 % (ref 43–77)
Neutro Abs: 3.6 10*3/uL (ref 1.7–7.7)
PLATELETS: 232 10*3/uL (ref 150–400)
RBC: 4.31 MIL/uL (ref 4.22–5.81)
RDW: 13.3 % (ref 11.5–15.5)
WBC: 5.3 10*3/uL (ref 4.0–10.5)

## 2014-08-30 LAB — POCT URINALYSIS DIPSTICK
Bilirubin, UA: NEGATIVE
Blood, UA: NEGATIVE
GLUCOSE UA: NEGATIVE
Ketones, UA: NEGATIVE
Leukocytes, UA: NEGATIVE
NITRITE UA: NEGATIVE
Protein, UA: NEGATIVE
Spec Grav, UA: 1.02
Urobilinogen, UA: NEGATIVE
pH, UA: 5

## 2014-08-30 LAB — COMPREHENSIVE METABOLIC PANEL
ALBUMIN: 4 g/dL (ref 3.5–5.2)
ALK PHOS: 57 U/L (ref 39–117)
ALT: 17 U/L (ref 0–53)
AST: 18 U/L (ref 0–37)
BUN: 17 mg/dL (ref 6–23)
CO2: 26 mEq/L (ref 19–32)
Calcium: 9 mg/dL (ref 8.4–10.5)
Chloride: 105 mEq/L (ref 96–112)
Creat: 1.49 mg/dL — ABNORMAL HIGH (ref 0.50–1.35)
Glucose, Bld: 141 mg/dL — ABNORMAL HIGH (ref 70–99)
POTASSIUM: 4.2 meq/L (ref 3.5–5.3)
SODIUM: 140 meq/L (ref 135–145)
TOTAL PROTEIN: 6.2 g/dL (ref 6.0–8.3)
Total Bilirubin: 0.6 mg/dL (ref 0.2–1.2)

## 2014-08-30 LAB — LIPASE: LIPASE: 19 U/L (ref 0–75)

## 2014-08-30 NOTE — Progress Notes (Signed)
Subjective:     Patient ID: [name], male   DOB: [birth year], 63 y.o.   MRN: [id number]  [name] presents for Abdominal Pain  Chief Complaint  Patient presents with  . Abdominal Pain    saw Audelia Acton 07/24/14 for same issue. Has had some abdominal pain x 6 weeks that radiates around to his back (right side). He really feels bloated most of the time. No vomiting or diarrhea.    He has been having abdominal discomfort and back pain that started mid-end of July.  He had dry heaves/vomiting initially (?upset stomach after driving in [place]).  Currently describes pain as "bloated", on the right lower side, and spreads around to the right lower back.  It is not related to eating.  It feels better after having a bowel movement and passing gas, but then recurs.  He has had some constipation intermittently over the last 6 weeks.  Last BM was this morning.  He has taken Tums without benefit.  Hasn't used other OTC meds, hasn't tried simethicone.  No change in diet--maybe eating some more salads; no change in dairy intake. He is prone to problems related to lactose intake, but he limits the intake to avoid problems.  Pain is every other day, lasts about 6 hours, gradually improves.  Onset is sometimes gradual, sometimes more sudden.  Yesterday he woke up with the pain, had breakfast, but didn't eat lunch (because wasn't feeling well).  Pain resolved sometime between 5-7pm.  He is not currently having pain.  Last colonoscopy was 2005; due this year (first done in [place]). He has h/o kidney stones, but this does not feel similar (that was much more severe)   Past Medical History  Diagnosis Date  . Hyperlipidemia   . ED (erectile dysfunction)   . Irregular heartbeat     started on beta blocker by Dr. Marlou Porch, improved  . Shingles 2010  . Kidney stone 03/2010  . Prostate cancer 01/2008     s/p brachiotherapy; Dr. Karsten Ro  . GERD (gastroesophageal reflux disease)   . CAD (coronary  artery disease) 2010    a. s/p DES to LAD 06/2009. b. LHC (05/23/14): LAD proximal to previously placed stent  30%, mid LAD distal to the stent 99%, CFX and RCA No CAD EF 50% with ant HK. PCI:  18 x 2.5 mm diameter Xience Alpine DES to mid LAD.  Marland Kitchen Hypertension    Past Surgical History  Procedure Laterality Date  . Colonoscopy  2005  . Vasectomy    . Radioactive seed implant  01/2008    prostate cancer  . Coronary angioplasty with stent placement  06/2009    "1"  . Cardiac catheterization  05/23/2014  . Inguinal hernia repair Left 10/1998   History   Social History  . Marital Status: Married    Spouse Name: N/A    Number of Children: 3  . Years of Education: N/A   Occupational History  . manufacturing    Social History Main Topics  . Smoking status: Former Smoker    Types: Cigarettes, Pipe, Landscape architect  . Smokeless tobacco: Never Used     Comment: distant tobacco h/o in college (2-3 packs/week); +pipe/cigar use occ  x 15 years; quit  in the 1990's  . Alcohol Use: 3.0 oz/week    5 Cans of beer per week     Comment: 3-4 beers per week  . Drug Use: No  . Sexual Activity: Yes   Other Topics  Concern  . Not on file   Social History Narrative   Married. 2 daughters in [place],  5 grandchildren (locally) and one in [place].   Working part-time, has a Programmer, systems).  Still looking for some supplemental part-time work.    Outpatient Encounter Prescriptions as of 08/30/2014  Medication Sig  . aspirin 81 MG tablet Take 81 mg by mouth daily.    Marland Kitchen atorvastatin (LIPITOR) 40 MG tablet Take 1 tablet (40 mg total) by mouth daily.  . citalopram (CELEXA) 40 MG tablet Take 1 tablet (40 mg total) by mouth daily.  . clopidogrel (PLAVIX) 75 MG tablet Take 1 tablet (75 mg total) by mouth daily with breakfast.  . diphenhydrAMINE (BENADRYL) 25 MG tablet Take 50 mg by mouth every evening.  . fish oil-omega-3 fatty acids 1000 MG capsule Take 1 g by mouth daily.    . pantoprazole (PROTONIX) 40  MG tablet Take 1 tablet (40 mg total) by mouth daily.  . vardenafil (LEVITRA) 20 MG tablet Take 20 mg by mouth daily as needed for erectile dysfunction.    Allergies  Allergen Reactions  . Imdur [Isosorbide Mononitrate] Rash    Review of Systems  Constitutional: Positive for appetite change. Negative for fever, chills, fatigue and unexpected weight change.  HENT: Negative.   Respiratory: Negative.   Cardiovascular: Negative.   Gastrointestinal: Positive for nausea, abdominal pain, constipation and abdominal distention. Negative for vomiting, diarrhea and blood in stool.  Genitourinary: Negative for dysuria, urgency, frequency and hematuria.  Musculoskeletal: Negative.   Neurological: Negative.   Psychiatric/Behavioral: Negative.       Objective:     BP 150/80  Pulse 64  Temp(Src) 97.8 F (36.6 C) (Tympanic)  Ht 5\' 10"  (1.778 m)  Wt 195 lb (88.451 kg)  BMI 27.98 kg/m2  Physical Exam  Nursing note and vitals reviewed. Constitutional: He is oriented to person, place, and time. He appears well-developed and well-nourished.  HENT:  Head: Normocephalic.  Mouth/Throat: Oropharynx is clear and moist.  Eyes: Conjunctivae are normal. Pupils are equal, round, and reactive to light.  Neck: Neck supple. No thyromegaly present.  Cardiovascular: Normal rate, regular rhythm, normal heart sounds and intact distal pulses.   Pulmonary/Chest: Effort normal and breath sounds normal. No respiratory distress.  Abdominal: Soft. Bowel sounds are normal. He exhibits no distension and no mass. There is no tenderness. There is no rebound and no guarding.  Musculoskeletal: Normal range of motion. He exhibits no edema.  Lymphadenopathy:    He has no cervical adenopathy.  Neurological: He is alert and oriented to person, place, and time.  Skin: Skin is warm and dry.  Psychiatric: He has a normal mood and affect. His behavior is normal. Judgment and thought content normal.  back: no CVA tenderness,  muscle spasm, spinal tenderness.  Normal urine dip, SG 1.020     Assessment and Plan          RLQ abdominal pain - intermittent x 6 wks, slight radation to back,with bloating. Nl u/a. check CT, labs. Due for colonoscopy - Plan: Comprehensive metabolic panel, CBC with Differential, Lipase, CT Abdomen Pelvis W Contrast  Abdominal bloating - Ddx reviewed; diet discussed.  Trial of simethicone (vs Beano prior)  Need for prophylactic vaccination and inoculation against influenza - Plan: Flu Vaccine QUAD 36+ mos PF IM (Fluarix Quad PF)  Abdominal pain, unspecified site - Plan: POCT Urinalysis Dipstick  Colon cancer screening - Plan: Ambulatory referral to Gastroenterology

## 2014-08-30 NOTE — Patient Instructions (Signed)
We are scheduling you for CT scan of abdomen as part of evaluation of your pain.  You are due for routine colonoscopy, and we will be referring you to [facility].  Use Simethicone (the active ingredient in Gas-X) as needed for abdominal bloating, pain.  Return if having increasing pain, fever, blood in stool, or other new concerns.

## 2014-08-31 ENCOUNTER — Other Ambulatory Visit: Payer: Self-pay | Admitting: *Deleted

## 2014-08-31 NOTE — Progress Notes (Signed)
PT WAS INFORMED AND VERBALIZED UNDERSTANDING

## 2014-09-01 ENCOUNTER — Encounter (HOSPITAL_COMMUNITY): Payer: BC Managed Care – PPO

## 2014-09-01 ENCOUNTER — Encounter (HOSPITAL_COMMUNITY)
Admission: RE | Admit: 2014-09-01 | Discharge: 2014-09-01 | Disposition: A | Discharge status: Home / Self Care | Payer: BC Managed Care – PPO | Source: Ambulatory Visit | Attending: Cardiology | Admitting: Cardiology

## 2014-09-01 ENCOUNTER — Other Ambulatory Visit: Payer: BC Managed Care – PPO

## 2014-09-01 DIAGNOSIS — Z9861 Coronary angioplasty status: Secondary | ICD-10-CM | POA: Diagnosis not present

## 2014-09-04 ENCOUNTER — Encounter (HOSPITAL_COMMUNITY): Payer: BC Managed Care – PPO

## 2014-09-06 ENCOUNTER — Encounter (HOSPITAL_COMMUNITY): Payer: BC Managed Care – PPO

## 2014-09-06 ENCOUNTER — Encounter (HOSPITAL_COMMUNITY)
Admission: RE | Admit: 2014-09-06 | Discharge: 2014-09-06 | Disposition: A | Discharge status: Home / Self Care | Payer: BC Managed Care – PPO | Source: Ambulatory Visit | Attending: Cardiology | Admitting: Cardiology

## 2014-09-06 DIAGNOSIS — Z9861 Coronary angioplasty status: Secondary | ICD-10-CM | POA: Diagnosis not present

## 2014-09-07 ENCOUNTER — Encounter: Payer: Self-pay | Admitting: Family Medicine

## 2014-09-07 ENCOUNTER — Ambulatory Visit
Admission: RE | Admit: 2014-09-07 | Discharge: 2014-09-07 | Disposition: A | Discharge status: Home / Self Care | Payer: BC Managed Care – PPO | Source: Ambulatory Visit | Attending: Family Medicine | Admitting: Family Medicine

## 2014-09-07 ENCOUNTER — Telehealth: Payer: Self-pay | Admitting: Internal Medicine

## 2014-09-07 DIAGNOSIS — R1031 Right lower quadrant pain: Secondary | ICD-10-CM

## 2014-09-07 DIAGNOSIS — N201 Calculus of ureter: Secondary | ICD-10-CM

## 2014-09-07 MED ORDER — TAMSULOSIN HCL 0.4 MG PO CAPS: 0.4000 mg | ORAL_CAPSULE | Freq: Every day | ORAL | Status: DC

## 2014-09-07 MED ORDER — IOHEXOL 300 MG/ML  SOLN
100.0000 mL | Freq: Once | INTRAMUSCULAR | Status: AC | PRN
  Administered 2014-09-07: 100 mL via INTRAVENOUS

## 2014-09-07 NOTE — Telephone Encounter (Signed)
Pt called and spoke to Beverlee Nims wanting flomax sent to the correct pharmacy. Send to [facility] [place], [place] # [phone]. I did not see where you filled it for him so i wanted to send you the message first.

## 2014-09-07 NOTE — Telephone Encounter (Signed)
We discussed his CT results earlier, showing kidney stone.  At time of phone conversation, he was planning to call his urologist, Dr. Karsten Ro for fu. I guess he changed his mind, and wanted to start flomax sooner.  Rx send.

## 2014-09-08 ENCOUNTER — Encounter (HOSPITAL_COMMUNITY): Payer: BC Managed Care – PPO

## 2014-09-11 ENCOUNTER — Encounter (HOSPITAL_COMMUNITY): Payer: BC Managed Care – PPO

## 2014-09-11 ENCOUNTER — Encounter (HOSPITAL_COMMUNITY)
Admission: RE | Admit: 2014-09-11 | Discharge: 2014-09-11 | Disposition: A | Discharge status: Home / Self Care | Payer: BC Managed Care – PPO | Source: Ambulatory Visit | Attending: Cardiology | Admitting: Cardiology

## 2014-09-11 DIAGNOSIS — Z9861 Coronary angioplasty status: Secondary | ICD-10-CM | POA: Diagnosis not present

## 2014-09-12 ENCOUNTER — Telehealth: Payer: Self-pay | Admitting: Cardiology

## 2014-09-12 NOTE — Telephone Encounter (Signed)
Per pt - [name] was taken off Metoprolol about 6 weeks ago d/t fatigue.  [name] is calling today because [name] has noticed his BP being higher.  [name] reports 160/82 and 168/94 once with exercise.  Instructed pt that I am unsure that Dr Marlou Porch will treat him for HTN based on 2 BP readings but that I will discuss with him and call back with further orders.  Suggested pt keep a BP dairy.  Pt reports [name] has been going to Cardiac Rehab and BP's should be recorded in EP[facility].  Aware I will have MD review and call back with instructions.

## 2014-09-12 NOTE — Telephone Encounter (Signed)
New message          Pt bp has been running high and he would like to know if he needs to go back on metoprolol

## 2014-09-13 ENCOUNTER — Encounter (HOSPITAL_COMMUNITY): Payer: BC Managed Care – PPO

## 2014-09-13 ENCOUNTER — Encounter (HOSPITAL_COMMUNITY)
Admission: RE | Admit: 2014-09-13 | Discharge: 2014-09-13 | Disposition: A | Discharge status: Home / Self Care | Payer: BC Managed Care – PPO | Source: Ambulatory Visit | Attending: Cardiology | Admitting: Cardiology

## 2014-09-13 DIAGNOSIS — Z9861 Coronary angioplasty status: Secondary | ICD-10-CM | POA: Diagnosis not present

## 2014-09-14 NOTE — Progress Notes (Signed)
[name] has been noted to have recent exertional blood pressure elevations at cardiac rehab. Resting blood pressure have been within normal limits. Will fax exercise flow sheets to Dr. Kingsley Plan office for review.

## 2014-09-15 ENCOUNTER — Encounter (HOSPITAL_COMMUNITY): Payer: BC Managed Care – PPO

## 2014-09-15 ENCOUNTER — Encounter (HOSPITAL_COMMUNITY)
Admission: RE | Admit: 2014-09-15 | Discharge: 2014-09-15 | Disposition: A | Discharge status: Home / Self Care | Payer: BC Managed Care – PPO | Source: Ambulatory Visit | Attending: Cardiology | Admitting: Cardiology

## 2014-09-15 DIAGNOSIS — Z9861 Coronary angioplasty status: Secondary | ICD-10-CM | POA: Diagnosis not present

## 2014-09-18 ENCOUNTER — Encounter (HOSPITAL_COMMUNITY): Payer: BC Managed Care – PPO

## 2014-09-18 ENCOUNTER — Encounter (HOSPITAL_COMMUNITY)
Admission: RE | Admit: 2014-09-18 | Discharge: 2014-09-18 | Disposition: A | Discharge status: Home / Self Care | Payer: BC Managed Care – PPO | Source: Ambulatory Visit | Attending: Cardiology | Admitting: Cardiology

## 2014-09-18 DIAGNOSIS — Z9861 Coronary angioplasty status: Secondary | ICD-10-CM | POA: Diagnosis not present

## 2014-09-20 ENCOUNTER — Encounter (HOSPITAL_COMMUNITY)
Admission: RE | Admit: 2014-09-20 | Discharge: 2014-09-20 | Disposition: A | Discharge status: Home / Self Care | Payer: BC Managed Care – PPO | Source: Ambulatory Visit | Attending: Cardiology | Admitting: Cardiology

## 2014-09-20 ENCOUNTER — Encounter (HOSPITAL_COMMUNITY): Payer: BC Managed Care – PPO

## 2014-09-20 DIAGNOSIS — Z9861 Coronary angioplasty status: Secondary | ICD-10-CM | POA: Diagnosis not present

## 2014-09-21 ENCOUNTER — Ambulatory Visit (INDEPENDENT_AMBULATORY_CARE_PROVIDER_SITE_OTHER): Payer: BC Managed Care – PPO | Admitting: Cardiology

## 2014-09-21 ENCOUNTER — Encounter: Payer: Self-pay | Admitting: Cardiology

## 2014-09-21 VITALS — BP 152/90 | HR 69 | Ht 70.0 in | Wt 195.6 lb

## 2014-09-21 DIAGNOSIS — I10 Essential (primary) hypertension: Secondary | ICD-10-CM | POA: Insufficient documentation

## 2014-09-21 DIAGNOSIS — I2510 Atherosclerotic heart disease of native coronary artery without angina pectoris: Secondary | ICD-10-CM

## 2014-09-21 DIAGNOSIS — E7800 Pure hypercholesterolemia, unspecified: Secondary | ICD-10-CM

## 2014-09-21 MED ORDER — CARVEDILOL 3.125 MG PO TABS: 3.1250 mg | ORAL_TABLET | Freq: Two times a day (BID) | ORAL | Status: DC

## 2014-09-21 NOTE — Telephone Encounter (Signed)
To be addressed at appt with Dr Marlou Porch 9/24

## 2014-09-21 NOTE — Progress Notes (Signed)
[facility]. [address], [address] [place], [place]  [address] Phone: [phone] Fax:  [phone]  Date:  09/21/2014   ID:  [name], DOB Apr 08, 1951, MRN [id number]  PCP:  Vikki Ports, MD   History of Present Illness: [name] is a 63 y.o. male with coronary artery disease status post DES to LAD and 06/2009 as well as DES to LAD 05/23/14 distal to previously placed stent with hypertension, hyperlipidemia, anxiety here for followup.  Overall feeling well without any exertional angina. He does have a kidney stone that he has been battling now for 2 years. He wonders if this is increasing blood pressure.  Studies:  - LHC (05/23/14): LAD proximal to previously placed stent 30%, mid LAD distal to the stent 99%, CFX and RCA No CAD EF 50% with ant HK. PCI: 18 x 2.5 mm diameter Xience Alpine DES to mid LAD.  - Nuclear (04/26/14): High risk stress nuclear study . There is a small but severe area of ischemia in the distal anterior wall. EF 46%. Ant HK.   Recent Labs:  11/03/2013: ALT 37; HDL Cholesterol by NMR 41; LDL (calc) 51  05/25/2014: Creatinine 1.02; Hemoglobin 14.1; Potassium 4.6       Wt Readings from Last 3 Encounters:  09/21/14 195 lb 9.6 oz (88.724 kg)  08/30/14 195 lb (88.451 kg)  07/24/14 200 lb (90.719 kg)     Past Medical History  Diagnosis Date  . Hyperlipidemia   . ED (erectile dysfunction)   . Irregular heartbeat     started on beta blocker by Dr. Marlou Porch, improved  . Shingles 2010  . Kidney stone 03/2010    08/2014-distal R ureter  . Prostate cancer 01/2008     s/p brachiotherapy; Dr. Karsten Ro  . GERD (gastroesophageal reflux disease)   . CAD (coronary artery disease) 2010    a. s/p DES to LAD 06/2009. b. LHC (05/23/14): LAD proximal to previously placed stent  30%, mid LAD distal to the stent 99%, CFX and RCA No CAD EF 50% with ant HK. PCI:  18 x 2.5 mm diameter Xience Alpine DES to mid LAD.  Marland Kitchen Hypertension   . Inguinal hernia, left 08/2014    seen  on CT (containing peritoneal fat)    Past Surgical History  Procedure Laterality Date  . Colonoscopy  2005  . Vasectomy    . Radioactive seed implant  01/2008    prostate cancer  . Coronary angioplasty with stent placement  06/2009    "1"  . Cardiac catheterization  05/23/2014  . Inguinal hernia repair Left 10/1998    Current Outpatient Prescriptions  Medication Sig Dispense Refill  . aspirin 81 MG tablet Take 81 mg by mouth daily.        Marland Kitchen atorvastatin (LIPITOR) 40 MG tablet Take 1 tablet (40 mg total) by mouth daily.  90 tablet  1  . citalopram (CELEXA) 40 MG tablet Take 1 tablet (40 mg total) by mouth daily.  90 tablet  3  . clopidogrel (PLAVIX) 75 MG tablet Take 1 tablet (75 mg total) by mouth daily with breakfast.  30 tablet  11  . diphenhydrAMINE (BENADRYL) 25 MG tablet Take 50 mg by mouth every evening.      . fish oil-omega-3 fatty acids 1000 MG capsule Take 1 g by mouth daily.        . pantoprazole (PROTONIX) 40 MG tablet Take 1 tablet (40 mg total) by mouth daily.  [place]  tablet  11  . tamsulosin (FLOMAX) 0.4 MG CAPS capsule Take 1 capsule (0.4 mg total) by mouth daily.  30 capsule  0  . vardenafil (LEVITRA) 20 MG tablet Take 20 mg by mouth daily as needed for erectile dysfunction.        No current facility-administered medications for this visit.    Allergies:    Allergies  Allergen Reactions  . Imdur [Isosorbide Mononitrate] Rash    Social History:  The patient  reports that he has quit smoking. His smoking use included Cigarettes, Pipe, and Cigars. He smoked 0.00 packs per day. He has never used smokeless tobacco. He reports that he drinks about 3 ounces of alcohol per week. He reports that he does not use illicit drugs.   Works at [employer] History  Problem Relation Age of Onset  . Heart disease Father 39  . Cancer Father 17    prostate  . Hypertension Father   . Hyperlipidemia Father   . Cancer Mother     bladder  . Eating disorder Mother   . Hodgkin's  lymphoma Daughter   . Cardiomyopathy Daughter     related to treatment for lymphoma  . Cancer Daughter     thyroid  . Heart disease Daughter     congenital ASD, s/p repair  . Cancer Paternal Grandfather     prostate  . Diabetes Neg Hx     ROS:  Please see the history of present illness.   Denies any fevers, chills, orthopnea, PND, syncope.   All other systems reviewed and negative.   PHYSICAL EXAM: VS:  BP 152/90  Pulse 69  Ht 5\' 10"  (1.778 m)  Wt 195 lb 9.6 oz (88.724 kg)  BMI 28.07 kg/m2 Well nourished, well developed, in no acute distress HEENT: normal, [place]/AT, EOMI Neck: no JVD, normal carotid upstroke, no bruit Cardiac:  normal S1, S2; RRR; no murmur Lungs:  clear to auscultation bilaterally, no wheezing, rhonchi or rales Abd: soft, nontender, no hepatomegaly, no bruits Ext: no edema, 2+ distal pulses Skin: warm and dry GU: deferred Neuro: no focal abnormalities noted, AAO x 3  EKG:  None today     ASSESSMENT AND PLAN:  1. Coronary artery disease-2 DES and LAD placed in 2010 and 2015. Needs one year total of dual antiplatelet therapy. Reviewed. Continue with aggressive secondary prevention. 2. Hypertension-recently blood pressure has been quite labile. Has been as high as 170 during exercise at rehabilitation. We will go ahead and restart low-dose beta blocker, carvedilol 3.125 mg twice a day. Previously he was on metoprolol. May have felt some fatigue on this medication although after stopping beta blocker, fatigue is not completely resolved. 3. Hyperlipidemia-continue with atorvastatin 40 mg. LDL goal less than 70. 4. We will see back in one month. Continue to monitor blood pressure cardiac rehabilitation.  Signed, Candee Furbish, MD [facility]  09/21/2014 8:48 AM

## 2014-09-21 NOTE — Patient Instructions (Signed)
Please start Carvedilol 3.125 mg one tablet twice a day. Continue all other medications as listed.  Follow up with Dr. Marlou Porch in approximately 1 month.

## 2014-09-22 ENCOUNTER — Encounter (HOSPITAL_COMMUNITY)
Admission: RE | Admit: 2014-09-22 | Discharge: 2014-09-22 | Disposition: A | Discharge status: Home / Self Care | Payer: BC Managed Care – PPO | Source: Ambulatory Visit | Attending: Cardiology | Admitting: Cardiology

## 2014-09-22 ENCOUNTER — Encounter (HOSPITAL_COMMUNITY): Payer: BC Managed Care – PPO

## 2014-09-22 DIAGNOSIS — Z9861 Coronary angioplasty status: Secondary | ICD-10-CM | POA: Diagnosis not present

## 2014-09-25 ENCOUNTER — Encounter (HOSPITAL_COMMUNITY): Payer: BC Managed Care – PPO

## 2014-09-25 ENCOUNTER — Encounter (HOSPITAL_COMMUNITY)
Admission: RE | Admit: 2014-09-25 | Discharge: 2014-09-25 | Disposition: A | Discharge status: Home / Self Care | Payer: BC Managed Care – PPO | Source: Ambulatory Visit | Attending: Cardiology | Admitting: Cardiology

## 2014-09-25 DIAGNOSIS — Z9861 Coronary angioplasty status: Secondary | ICD-10-CM | POA: Diagnosis not present

## 2014-09-27 ENCOUNTER — Encounter (HOSPITAL_COMMUNITY): Payer: BC Managed Care – PPO

## 2014-09-27 ENCOUNTER — Encounter (HOSPITAL_COMMUNITY)
Admission: RE | Admit: 2014-09-27 | Discharge: 2014-09-27 | Disposition: A | Discharge status: Home / Self Care | Payer: BC Managed Care – PPO | Source: Ambulatory Visit | Attending: Cardiology | Admitting: Cardiology

## 2014-09-27 DIAGNOSIS — Z9861 Coronary angioplasty status: Secondary | ICD-10-CM | POA: Diagnosis not present

## 2014-09-28 ENCOUNTER — Encounter: Payer: Self-pay | Admitting: Cardiology

## 2014-09-29 ENCOUNTER — Encounter (HOSPITAL_COMMUNITY): Payer: BC Managed Care – PPO

## 2014-09-29 ENCOUNTER — Encounter (HOSPITAL_COMMUNITY)
Admission: RE | Admit: 2014-09-29 | Discharge: 2014-09-29 | Disposition: A | Discharge status: Home / Self Care | Payer: BC Managed Care – PPO | Source: Ambulatory Visit | Attending: Cardiology | Admitting: Cardiology

## 2014-09-29 DIAGNOSIS — Z955 Presence of coronary angioplasty implant and graft: Secondary | ICD-10-CM | POA: Diagnosis not present

## 2014-10-02 ENCOUNTER — Encounter (HOSPITAL_COMMUNITY): Payer: BC Managed Care – PPO

## 2014-10-02 ENCOUNTER — Encounter (HOSPITAL_COMMUNITY)
Admission: RE | Admit: 2014-10-02 | Discharge: 2014-10-02 | Disposition: A | Discharge status: Home / Self Care | Payer: BC Managed Care – PPO | Source: Ambulatory Visit | Attending: Cardiology | Admitting: Cardiology

## 2014-10-02 DIAGNOSIS — Z955 Presence of coronary angioplasty implant and graft: Secondary | ICD-10-CM | POA: Diagnosis not present

## 2014-10-04 ENCOUNTER — Encounter (HOSPITAL_COMMUNITY)
Admission: RE | Admit: 2014-10-04 | Discharge: 2014-10-04 | Disposition: A | Discharge status: Home / Self Care | Payer: BC Managed Care – PPO | Source: Ambulatory Visit | Attending: Cardiology | Admitting: Cardiology

## 2014-10-04 ENCOUNTER — Encounter (HOSPITAL_COMMUNITY): Payer: BC Managed Care – PPO

## 2014-10-04 DIAGNOSIS — Z955 Presence of coronary angioplasty implant and graft: Secondary | ICD-10-CM | POA: Diagnosis not present

## 2014-10-06 ENCOUNTER — Encounter (HOSPITAL_COMMUNITY)
Admission: RE | Admit: 2014-10-06 | Discharge: 2014-10-06 | Disposition: A | Discharge status: Home / Self Care | Payer: BC Managed Care – PPO | Source: Ambulatory Visit | Attending: Cardiology | Admitting: Cardiology

## 2014-10-06 ENCOUNTER — Encounter (HOSPITAL_COMMUNITY): Payer: BC Managed Care – PPO

## 2014-10-06 ENCOUNTER — Encounter: Payer: Self-pay | Admitting: Family Medicine

## 2014-10-06 DIAGNOSIS — Z955 Presence of coronary angioplasty implant and graft: Secondary | ICD-10-CM | POA: Diagnosis not present

## 2014-10-09 ENCOUNTER — Encounter (HOSPITAL_COMMUNITY): Payer: BC Managed Care – PPO

## 2014-10-11 ENCOUNTER — Encounter (HOSPITAL_COMMUNITY)
Admission: RE | Admit: 2014-10-11 | Discharge: 2014-10-11 | Disposition: A | Discharge status: Home / Self Care | Payer: BC Managed Care – PPO | Source: Ambulatory Visit | Attending: Cardiology | Admitting: Cardiology

## 2014-10-11 DIAGNOSIS — Z955 Presence of coronary angioplasty implant and graft: Secondary | ICD-10-CM | POA: Diagnosis not present

## 2014-10-11 NOTE — Progress Notes (Signed)
Pt graduates today from the Cardiac rehab phase II program with the completion of 36 exercise sessions. Pt made some progress as evident by the increase of his MET level from 4.5 to 4.8.  Pt elected not to participate in education classes due to his previous participation in Cardiac rehab.  Repeat PHQ2 score remains ).  Pt feels celexa works well for him and overall he is pleased with his recovery.  Pt plans to continue home exercise with walking and has a very active job where he averages 5 -6,000 steps a day.  Pt will return to the cardiac maintenance program at the beginning of the new year.  Pt short term goal was to lose weight which he was not at his ideal but was half way there.  Pt feels confident that he will be able to reach that goal in the near future.  Pt feels he has the tools needed to achieve this goal just needs consistency in his actions.  Pt long term goal is to be "off" meds.  Pt recently had beta blocker added due to elevation in bp as a trial for one month.  Pt will see dr. Marlou Porch in the office next week.  Pt has noted that his bp have moderated since he was able to pass a kidney stone.  Pt plans to discuss this in more detail with MD.  Pt cites financial reasons being the reason why he would like to be off meds.  It was a pleasure to work with this patient in cardiac rehab.  Cherre Huger, BSN

## 2014-10-13 ENCOUNTER — Encounter (HOSPITAL_COMMUNITY): Payer: BC Managed Care – PPO

## 2014-10-16 ENCOUNTER — Encounter (HOSPITAL_COMMUNITY): Payer: BC Managed Care – PPO

## 2014-10-18 ENCOUNTER — Encounter (HOSPITAL_COMMUNITY): Payer: BC Managed Care – PPO

## 2014-10-19 ENCOUNTER — Ambulatory Visit (INDEPENDENT_AMBULATORY_CARE_PROVIDER_SITE_OTHER): Payer: BC Managed Care – PPO | Admitting: Cardiology

## 2014-10-19 ENCOUNTER — Encounter: Payer: Self-pay | Admitting: Cardiology

## 2014-10-19 VITALS — BP 142/88 | HR 57 | Ht 70.0 in | Wt 195.0 lb

## 2014-10-19 DIAGNOSIS — E7800 Pure hypercholesterolemia, unspecified: Secondary | ICD-10-CM

## 2014-10-19 DIAGNOSIS — I10 Essential (primary) hypertension: Secondary | ICD-10-CM

## 2014-10-19 DIAGNOSIS — I2583 Coronary atherosclerosis due to lipid rich plaque: Secondary | ICD-10-CM

## 2014-10-19 DIAGNOSIS — I2510 Atherosclerotic heart disease of native coronary artery without angina pectoris: Secondary | ICD-10-CM

## 2014-10-19 NOTE — Patient Instructions (Signed)
The current medical regimen is effective;  continue present plan and medications.  Follow up in 4 months with Dr Skains. 

## 2014-10-19 NOTE — Progress Notes (Signed)
[facility]. [address] [place], [place]  [address] Phone: [phone] Fax:  [phone]  Date:  10/19/2014   ID:  [name], DOB Oct 18, 1951, MRN [id number]  PCP:  Allen Ports, MD   History of Present Illness: [name] is a 63 y.o. male with coronary artery disease status post DES to LAD and 06/2009 as well as DES to LAD 05/23/14 distal to previously placed stent with hypertension, hyperlipidemia, anxiety here for followup.  Overall feeling well without any exertional angina. He had a kidney stone that he had been battling now for 2 years and he recently passed it. He wonders if this is increasing blood pressure. Cardiac rehabilitation note from 10/11/14 shows that he has made some progress and increase in activity from 4.5-4.8 METs. Celexa working well for him. Cardiac maintenance program. He wishes to be off of medications if possible. Beta blocker, carvedilol 3.125 twice a day was added in September of 2015 as a trial. His blood pressures are borderline elevated. At home usually 030 systolic.  Studies:  - LHC (05/23/14): LAD proximal to previously placed stent 30%, mid LAD distal to the stent 99%, CFX and RCA No CAD EF 50% with ant HK. PCI: 18 x 2.5 mm diameter Xience Alpine DES to mid LAD.  - Nuclear (04/26/14): High risk stress nuclear study . There is a small but severe area of ischemia in the distal anterior wall. EF 46%. Ant HK.   Recent Labs:  11/03/2013: ALT 37; HDL Cholesterol by NMR 41; LDL (calc) 51  05/25/2014: Creatinine 1.02; Hemoglobin 14.1; Potassium 4.6       Wt Readings from Last 3 Encounters:  10/19/14 195 lb (88.451 kg)  09/21/14 195 lb 9.6 oz (88.724 kg)  08/30/14 195 lb (88.451 kg)     Past Medical History  Diagnosis Date  . Hyperlipidemia   . ED (erectile dysfunction)   . Irregular heartbeat     started on beta blocker by Dr. Marlou Porch, improved  . Shingles 2010  . Kidney stone 03/2010    08/2014-distal R ureter  . Prostate  cancer 01/2008     s/p brachiotherapy; Dr. Karsten Ro  . GERD (gastroesophageal reflux disease)   . CAD (coronary artery disease) 2010    a. s/p DES to LAD 06/2009. b. LHC (05/23/14): LAD proximal to previously placed stent  30%, mid LAD distal to the stent 99%, CFX and RCA No CAD EF 50% with ant HK. PCI:  18 x 2.5 mm diameter Xience Alpine DES to mid LAD.  Marland Kitchen Hypertension   . Inguinal hernia, left 08/2014    seen on CT (containing peritoneal fat)    Past Surgical History  Procedure Laterality Date  . Colonoscopy  2005  . Vasectomy    . Radioactive seed implant  01/2008    prostate cancer  . Coronary angioplasty with stent placement  06/2009    "1"  . Cardiac catheterization  05/23/2014  . Inguinal hernia repair Left 10/1998    Current Outpatient Prescriptions  Medication Sig Dispense Refill  . aspirin 81 MG tablet Take 81 mg by mouth daily.        Marland Kitchen atorvastatin (LIPITOR) 40 MG tablet Take 1 tablet (40 mg total) by mouth daily.  90 tablet  1  . carvedilol (COREG) 3.125 MG tablet Take 1 tablet (3.125 mg total) by mouth 2 (two) times daily.  60 tablet  6  . citalopram (CELEXA) 40 MG tablet Take 1 tablet (  40 mg total) by mouth daily.  90 tablet  3  . clopidogrel (PLAVIX) 75 MG tablet Take 1 tablet (75 mg total) by mouth daily with breakfast.  30 tablet  11  . diphenhydrAMINE (BENADRYL) 25 MG tablet Take 50 mg by mouth every evening.      . fish oil-omega-3 fatty acids 1000 MG capsule Take 1 g by mouth daily.        . pantoprazole (PROTONIX) 40 MG tablet Take 1 tablet (40 mg total) by mouth daily.  30 tablet  11  . vardenafil (LEVITRA) 20 MG tablet Take 20 mg by mouth daily as needed for erectile dysfunction.        No current facility-administered medications for this visit.    Allergies:    Allergies  Allergen Reactions  . Imdur [Isosorbide Mononitrate] Rash    Social History:  The patient  reports that he has quit smoking. His smoking use included Cigarettes, Pipe, and Cigars. He  smoked 0.00 packs per day. He has never used smokeless tobacco. He reports that he drinks about 3 ounces of alcohol per week. He reports that he does not use illicit drugs.   Works at [employer] History  Problem Relation Age of Onset  . Heart disease Father 59  . Cancer Father 17    prostate  . Hypertension Father   . Hyperlipidemia Father   . Cancer Mother     bladder  . Eating disorder Mother   . Hodgkin's lymphoma Daughter   . Cardiomyopathy Daughter     related to treatment for lymphoma  . Cancer Daughter     thyroid  . Heart disease Daughter     congenital ASD, s/p repair  . Cancer Paternal Grandfather     prostate  . Diabetes Neg Hx     ROS:  Please see the history of present illness.   Denies any fevers, chills, orthopnea, PND, syncope.   All other systems reviewed and negative.   PHYSICAL EXAM: VS:  BP 142/88  Pulse 57  Ht 5\' 10"  (1.778 m)  Wt 195 lb (88.451 kg)  BMI 27.98 kg/m2 Well nourished, well developed, in no acute distress HEENT: normal, [place]/AT, EOMI Neck: no JVD, normal carotid upstroke, no bruit Cardiac:  normal S1, S2; RRR; no murmur Lungs:  clear to auscultation bilaterally, no wheezing, rhonchi or rales Abd: soft, nontender, no hepatomegaly, no bruits Ext: no edema, 2+ distal pulses Skin: warm and dry GU: deferred Neuro: no focal abnormalities noted, AAO x 3  EKG:  None today     ASSESSMENT AND PLAN:  1. Coronary artery disease-2 DES and LAD placed in 2010 and 2015. Needs one year total of dual antiplatelet therapy. Reviewed. Continue with aggressive secondary prevention. 2. Hypertension-recently blood pressure has been quite labile. Has been as high as 170 during exercise at rehabilitation. Restarted low-dose beta blocker, carvedilol 3.125 mg twice a day in 08/2014. Previously he was on metoprolol. May have felt some fatigue on this medication although after stopping beta blocker, fatigue is not completely resolved. He was asking if Celexa can  cause fatigue. Ultimately, I am pleased with some of his progress with carvedilol low dose. Heart rate does not tolerate increase dose. If necessary, we can add ACE inhibitor in the future. He will monitor his blood pressures at home. 3. Hyperlipidemia-continue with atorvastatin 40 mg. LDL goal less than 70. 4. We will see back in four month. Completed cardiac rehabilitation. Likely will go to maintenance after  holidays.  Signed, Candee Furbish, MD [facility]  10/19/2014 8:49 AM

## 2014-10-20 ENCOUNTER — Encounter (HOSPITAL_COMMUNITY): Payer: BC Managed Care – PPO

## 2014-10-23 ENCOUNTER — Encounter (HOSPITAL_COMMUNITY): Payer: BC Managed Care – PPO

## 2014-10-25 ENCOUNTER — Encounter (HOSPITAL_COMMUNITY): Payer: BC Managed Care – PPO

## 2014-10-27 ENCOUNTER — Encounter (HOSPITAL_COMMUNITY): Payer: BC Managed Care – PPO

## 2014-10-30 ENCOUNTER — Telehealth: Payer: Self-pay | Admitting: Internal Medicine

## 2014-10-30 ENCOUNTER — Other Ambulatory Visit: Payer: Self-pay | Admitting: *Deleted

## 2014-10-30 DIAGNOSIS — F411 Generalized anxiety disorder: Secondary | ICD-10-CM

## 2014-10-30 MED ORDER — CITALOPRAM HYDROBROMIDE 40 MG PO TABS: 40.0000 mg | ORAL_TABLET | Freq: Every day | ORAL | Status: DC

## 2014-10-30 NOTE — Telephone Encounter (Signed)
[facility] for #90.  Please schedule CPE (was due this month)--can put on cancellation list

## 2014-10-30 NOTE — Telephone Encounter (Signed)
Refill request for celexa 40mg  #90 to [facility]

## 2014-10-30 NOTE — Telephone Encounter (Signed)
rx sent and detailed message left for patient.

## 2014-10-30 NOTE — Telephone Encounter (Signed)
Is this okay to refill? Looks like he is due for CPE?

## 2014-10-31 ENCOUNTER — Other Ambulatory Visit: Payer: Self-pay

## 2014-10-31 MED ORDER — ATORVASTATIN CALCIUM 40 MG PO TABS: 40.0000 mg | ORAL_TABLET | Freq: Every day | ORAL | Status: DC

## 2014-11-03 ENCOUNTER — Other Ambulatory Visit: Payer: Self-pay

## 2014-11-06 ENCOUNTER — Ambulatory Visit (INDEPENDENT_AMBULATORY_CARE_PROVIDER_SITE_OTHER): Payer: BC Managed Care – PPO | Admitting: Family Medicine

## 2014-11-06 ENCOUNTER — Encounter: Payer: Self-pay | Admitting: Family Medicine

## 2014-11-06 VITALS — BP 158/90 | HR 56 | Ht 71.0 in | Wt 196.0 lb

## 2014-11-06 DIAGNOSIS — I10 Essential (primary) hypertension: Secondary | ICD-10-CM

## 2014-11-06 DIAGNOSIS — E7800 Pure hypercholesterolemia, unspecified: Secondary | ICD-10-CM

## 2014-11-06 DIAGNOSIS — C61 Malignant neoplasm of prostate: Secondary | ICD-10-CM

## 2014-11-06 DIAGNOSIS — I2510 Atherosclerotic heart disease of native coronary artery without angina pectoris: Secondary | ICD-10-CM

## 2014-11-06 DIAGNOSIS — Z0000 Encounter for general adult medical examination without abnormal findings: Secondary | ICD-10-CM

## 2014-11-06 DIAGNOSIS — Z5181 Encounter for therapeutic drug level monitoring: Secondary | ICD-10-CM

## 2014-11-06 DIAGNOSIS — R5383 Other fatigue: Secondary | ICD-10-CM

## 2014-11-06 DIAGNOSIS — K219 Gastro-esophageal reflux disease without esophagitis: Secondary | ICD-10-CM

## 2014-11-06 LAB — CBC WITH DIFFERENTIAL/PLATELET
BASOS PCT: 0 % (ref 0–1)
Basophils Absolute: 0 10*3/uL (ref 0.0–0.1)
Eosinophils Absolute: 0.2 10*3/uL (ref 0.0–0.7)
Eosinophils Relative: 3 % (ref 0–5)
HCT: 42.8 % (ref 39.0–52.0)
HEMOGLOBIN: 14.7 g/dL (ref 13.0–17.0)
Lymphocytes Relative: 29 % (ref 12–46)
Lymphs Abs: 1.7 10*3/uL (ref 0.7–4.0)
MCH: 31.8 pg (ref 26.0–34.0)
MCHC: 34.3 g/dL (ref 30.0–36.0)
MCV: 92.6 fL (ref 78.0–100.0)
MONO ABS: 0.7 10*3/uL (ref 0.1–1.0)
MONOS PCT: 12 % (ref 3–12)
NEUTROS PCT: 56 % (ref 43–77)
Neutro Abs: 3.4 10*3/uL (ref 1.7–7.7)
Platelets: 198 10*3/uL (ref 150–400)
RBC: 4.62 MIL/uL (ref 4.22–5.81)
RDW: 14 % (ref 11.5–15.5)
WBC: 6 10*3/uL (ref 4.0–10.5)

## 2014-11-06 LAB — COMPREHENSIVE METABOLIC PANEL
ALBUMIN: 4.3 g/dL (ref 3.5–5.2)
ALK PHOS: 60 U/L (ref 39–117)
ALT: 21 U/L (ref 0–53)
AST: 22 U/L (ref 0–37)
BUN: 15 mg/dL (ref 6–23)
CO2: 27 mEq/L (ref 19–32)
Calcium: 9.2 mg/dL (ref 8.4–10.5)
Chloride: 104 mEq/L (ref 96–112)
Creat: 1.16 mg/dL (ref 0.50–1.35)
Glucose, Bld: 74 mg/dL (ref 70–99)
POTASSIUM: 4.3 meq/L (ref 3.5–5.3)
SODIUM: 139 meq/L (ref 135–145)
Total Bilirubin: 0.9 mg/dL (ref 0.2–1.2)
Total Protein: 6.6 g/dL (ref 6.0–8.3)

## 2014-11-06 LAB — POCT URINALYSIS DIPSTICK
Bilirubin, UA: NEGATIVE
Blood, UA: NEGATIVE
Glucose, UA: NEGATIVE
Ketones, UA: NEGATIVE
Leukocytes, UA: NEGATIVE
Nitrite, UA: NEGATIVE
PH UA: 6
PROTEIN UA: NEGATIVE
SPEC GRAV UA: 1.01
Urobilinogen, UA: NEGATIVE

## 2014-11-06 LAB — TSH: TSH: 1.989 u[IU]/mL (ref 0.350–4.500)

## 2014-11-06 LAB — LIPID PANEL
CHOL/HDL RATIO: 2.7 ratio
CHOLESTEROL: 115 mg/dL (ref 0–200)
HDL: 43 mg/dL (ref >39)
LDL CALC: 42 mg/dL (ref 0–99)
Triglycerides: 152 mg/dL — ABNORMAL HIGH (ref <150)
VLDL: 30 mg/dL (ref 0–40)

## 2014-11-06 NOTE — Progress Notes (Signed)
Chief Complaint  Patient presents with  . Annual Exam    fasting annual exam. Only complaint is fatigue x several months.    [name] is a 63 y.o. male who presents for a complete physical.  He is complaining of some fatigue.  HTN: BP's at home have been running 135/80, up to 140 as a high.  He sometimes wakes up with headaches in the morning, just recently, across his forehead.  He has been sneezing some.  No other headaches, no dizziness (rarely if stands too quickly), no edema.  Hasn't had any chest pain since the last stent was placed.  No dyspnea on exertion.  CAD: He had another DES placed 05/23/14 to LAD.  He completed cardiac rehab., and is considering going into the maintenance program. His BP's had been running high.  Carvedilol 3.125mg  BID was added by Dr. Marlou Porch in September. Pulse didn't tolerate higher doses.  Dr. Marlou Porch mentioned possibly ACEI in future if needed for better BP control. (previously had fatigue from metoprolol).   Needs to be on dual anti-platelet therapy for a year.  Kidney stone:  He had one in September, took Flomax and passed the stone.  No further problems with abdominal pain, or any urinary complaints.  GERD: His Nexium was changed to Protonix (due to interaction with Plavix).  Initially he had some heartburn after the change, but it has resolved.    Denies dysphagia.  Hyperlipidemia: Patient is reportedly following a low-fat, low cholesterol diet.  Compliant with medications and denies medication side effects  Prostate cancer: Diagnosed 2008, treated with brachytherapy. Seen yearly by Dr. Karsten Ro, due now.  We have been doing PSA and forwarding to Dr. Simone Curia office.  Anxiety: Doing very well. Hasn't needed any alprazolam (doesn't even have prescription).  He wonders if the citalopram is causing fatigue.  He works evenings at [employer], which might contribute some to fatigue.  He gets tired during the middle of the day a few times/week, not  daily.  Immunization History  Administered Date(s) Administered  . Influenza Split 11/03/2011, 10/29/2012  . Influenza,inj,Quad PF,36+ Mos 10/26/2013, 08/30/2014  . Td 11/26/2005  . Tdap 11/03/2011  . Zoster 02/28/2014   Last colonoscopy: 2005; plans to schedule with [facility] soon Last PSA:  1 year ago Ophtho:2012-13, got new glasses last year Dentist: twice yearly  Exercise: getting 10,000 steps/day, some walking.  Completed cardiac rehab.  Past Medical History  Diagnosis Date  . Hyperlipidemia   . ED (erectile dysfunction)   . Irregular heartbeat     started on beta blocker by Dr. Marlou Porch, improved  . Shingles 2010  . Kidney stone 03/2010    08/2014-distal R ureter  . Prostate cancer 01/2008     s/p brachiotherapy; Dr. Karsten Ro  . GERD (gastroesophageal reflux disease)   . CAD (coronary artery disease) 2010    a. s/p DES to LAD 06/2009. b. LHC (05/23/14): LAD proximal to previously placed stent  30%, mid LAD distal to the stent 99%, CFX and RCA No CAD EF 50% with ant HK. PCI:  18 x 2.5 mm diameter Xience Alpine DES to mid LAD.  Marland Kitchen Hypertension   . Inguinal hernia, left 08/2014    seen on CT (containing peritoneal fat)    Past Surgical History  Procedure Laterality Date  . Colonoscopy  2005  . Vasectomy    . Radioactive seed implant  01/2008    prostate cancer  . Coronary angioplasty with stent placement  06/2009    "  1"  . Cardiac catheterization  05/23/2014  . Inguinal hernia repair Left 10/1998    History   Social History  . Marital Status: Married    Spouse Name: N/A    Number of Children: 3  . Years of Education: N/A   Occupational History  . manufacturing    Social History Main Topics  . Smoking status: Former Smoker    Types: Cigarettes, Pipe, Landscape architect  . Smokeless tobacco: Never Used     Comment: distant tobacco h/o in college (2-3 packs/week); +pipe/cigar use occ  x 15 years; quit  in the 1990's  . Alcohol Use: 3.0 oz/week    5 Cans of beer per week      Comment: 3-4 beers per week  . Drug Use: No  . Sexual Activity:    Partners: Female   Other Topics Concern  . Not on file   Social History Narrative   Married. 2 daughters in [place],  5 grandchildren (locally) and one in [place].   Working part-time, has a Programmer, systems).  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 Heart disease Father 70  . Cancer Father 77    prostate  . Hypertension Father   . Hyperlipidemia Father   . Cancer Mother     bladder  . Eating disorder Mother   . Hodgkin's lymphoma Daughter   . Cardiomyopathy Daughter     related to treatment for lymphoma  . Cancer Daughter     thyroid  . Heart disease Daughter     congenital ASD, s/p repair  . Cancer Paternal Grandfather     prostate  . Diabetes Neg Hx   . Heart disease Brother     arrhythmia    Outpatient Encounter Prescriptions as of 11/06/2014  Medication Sig  . aspirin 81 MG tablet Take 81 mg by mouth daily.    Marland Kitchen atorvastatin (LIPITOR) 40 MG tablet Take 1 tablet (40 mg total) by mouth daily.  . carvedilol (COREG) 3.125 MG tablet Take 1 tablet (3.125 mg total) by mouth 2 (two) times daily.  . citalopram (CELEXA) 40 MG tablet Take 1 tablet (40 mg total) by mouth daily.  . clopidogrel (PLAVIX) 75 MG tablet Take 1 tablet (75 mg total) by mouth daily with breakfast.  . diphenhydrAMINE (BENADRYL) 25 MG tablet Take 50 mg by mouth every evening.  . fish oil-omega-3 fatty acids 1000 MG capsule Take 1 g by mouth daily.    . pantoprazole (PROTONIX) 40 MG tablet Take 1 tablet (40 mg total) by mouth daily.  . vardenafil (LEVITRA) 20 MG tablet Take 20 mg by mouth daily as needed for erectile dysfunction.     Allergies  Allergen Reactions  . Imdur [Isosorbide Mononitrate] Rash   ROS: The patient denies anorexia, fever, weight changes. No vision loss, ear pain, hoarseness, chest pain, palpitations, dizziness, syncope, dyspnea on exertion, cough,  swelling, nausea, vomiting, diarrhea, constipation, abdominal pain, melena, hematochezia, indigestion/heartburn, hematuria, incontinence, nocturia (1x/night), weakened urine stream, dysuria, genital lesions, joint pains, numbness, tingling, weakness, tremor, suspicious skin lesions, depression, anxiety, abnormal bleeding/bruising, or enlarged lymph nodes.  +erectile dysfunction, controlled by Levitra. Frontal headaches as per HPI Slight decrease in hearing. Passed kidney stone--saw some blood then, but none since (September) Occasional heel pain.  PHYSICAL EXAM:  BP 170/98 mmHg  Pulse 56  Ht 5\' 11"  (1.803 m)  Wt 196 lb (88.905 kg)  BMI 27.35 kg/m2 158/90 on  repeat by MD  General Appearance:  Alert, cooperative, no distress, appears stated age.  Head:  Normocephalic, without obvious abnormality, atraumatic   Eyes:  PERRL, conjunctiva/corneas clear, EOM's intact, fundi  benign   Ears:  Normal TM's. Nonocclusive cerumen R>L  Nose:  Nares normal, mucosa --mild edema with clear mucus, no sinus tenderness   Throat:  Lips, mucosa, and tongue normal; teeth and gums normal   Neck:  Supple, no lymphadenopathy; thyroid: no enlargement/tenderness/nodules; no carotid  bruit or JVD   Back:  Spine nontender, no curvature, ROM normal, no CVA tenderness   Lungs:  Clear to auscultation bilaterally without wheezes, rales or ronchi; respirations unlabored   Chest Wall:  No tenderness or deformity   Heart:  Regular rate and rhythm, S1 and S2 normal, no murmur, rub  or gallop.   Breast Exam:  No chest wall tenderness, masses or gynecomastia   Abdomen:  Soft, non-tender, nondistended, normoactive bowel sounds,  no masses, no hepatosplenomegaly   Genitalia:  Normal male external genitalia without lesions. Testicles without masses. No inguinal hernias noted (just noted on CT on L).   Rectal:  Deferred to Urologist.   Extremities:  No clubbing, cyanosis or edema.    Pulses:  2+ and symmetric all extremities   Skin:  Skin color, texture, turgor normal.  Lymph nodes:  Cervical, supraclavicular, and axillary nodes normal   Neurologic:  CNII-XII intact, normal strength, sensation and gait; reflexes 2+ and symmetric throughout    Psych: Normal mood, affect, hygiene and grooming.   ASSESSMENT/PLAN:  Annual physical exam - Plan: Visual acuity screening, POCT Urinalysis Dipstick, Lipid panel, Comprehensive metabolic panel, CBC with Differential, Vit D  25 hydroxy (rtn osteoporosis monitoring), TSH, PSA  Medication monitoring encounter - Plan: Lipid panel, Comprehensive metabolic panel, CBC with Differential  Prostate cancer - f/u with Dr. Karsten Ro; will forward copy of PSA - Plan: PSA  Other fatigue - differential diagnosis reviewed.  check labs.  doubtful from citalopram since sx aren't daily - Plan: Comprehensive metabolic panel, CBC with Differential, Vit D  25 hydroxy (rtn osteoporosis monitoring), TSH  Essential hypertension, benign - borderline control.  f/u as scheduled with Dr. Marlou Porch; may need ACEI to get to BP goals.  low sodium diet; exercise recommendations reviewed  Gastroesophageal reflux disease, esophagitis presence not specified - controlled  Pure hypercholesterolemia - due for labs, previously at goal on current regimen. forward labs to Dr. Marlou Porch  Coronary artery disease involving native coronary artery of native heart without angina pectoris - doing well s/p DES. continue plavix, ASA.  f/u as scheduled with Dr. Marlou Porch.  Need to get BP to goal   Fatigue--discussed potential etiologies.  He wonders if it is related to citalopram--I doubt, given that symptoms of fatigue aren't daily. Can consider tapering back to 1/2 tablet of citalopram at some point (not recommended now) if fatigue persists, moods are good, and stressors are low.  Recommended at least 30 minutes of aerobic activity at least 5 days/week;  proper sunscreen use reviewed; healthy diet and alcohol recommendations (less than or equal to 2 drinks/day) reviewed; regular seatbelt use; changing batteries in smoke detectors. Self-testicular exams. Immunization recommendations discussed--pneumonia vaccine age 17, flu shots yearly. Colonoscopy recommendations reviewed, due in 2015.  Schedule ophtho, colonoscopy  Continue to monitor blood pressure at home, and follow up with Dr. Marlou Porch as scheduled.   Copies of labs to Dr. Karsten Ro and Dr. Marlou Porch

## 2014-11-06 NOTE — Patient Instructions (Signed)
  HEALTH MAINTENANCE RECOMMENDATIONS:  It is recommended that you get at least 30 minutes of aerobic exercise at least 5 days/week (for weight loss, you may need as much as 60-90 minutes). This can be any activity that gets your heart rate up. This can be divided in 10-15 minute intervals if needed, but try and build up your endurance at least once a week.  Weight bearing exercise is also recommended twice weekly.  Eat a healthy diet with lots of vegetables, fruits and fiber.  "Colorful" foods have a lot of vitamins (ie green vegetables, tomatoes, red peppers, etc).  Limit sweet tea, regular sodas and alcoholic beverages, all of which has a lot of calories and sugar.  Up to 2 alcoholic drinks daily may be beneficial for men (unless trying to lose weight, watch sugars).  Drink a lot of water.  Sunscreen of at least SPF 30 should be used on all sun-exposed parts of the skin when outside between the hours of 10 am and 4 pm (not just when at beach or pool, but even with exercise, golf, tennis, and yard work!)  Use a sunscreen that says "broad spectrum" so it covers both UVA and UVB rays, and make sure to reapply every 1-2 hours.  Remember to change the batteries in your smoke detectors when changing your clock times in the spring and fall.  Use your seat belt every time you are in a car, and please drive safely and not be distracted with cell phones and texting while driving.  Continue to monitor blood pressure at home, and follow up with Dr. Marlou Porch as scheduled.  Schedule routine eye exam. Schedule colonoscopy.

## 2014-11-07 LAB — PSA: PSA: 0.16 ng/mL (ref <=4.00)

## 2014-11-07 LAB — VITAMIN D 25 HYDROXY (VIT D DEFICIENCY, FRACTURES): Vit D, 25-Hydroxy: 23 ng/mL — ABNORMAL LOW (ref 30–89)

## 2014-11-07 MED ORDER — VITAMIN D (ERGOCALCIFEROL) 1.25 MG (50000 UNIT) PO CAPS: 50000.0000 [IU] | ORAL_CAPSULE | ORAL | Status: DC

## 2014-11-07 NOTE — Addendum Note (Signed)
Addended by: Rita Ohara on: 11/07/2014 08:50 AM   Modules accepted: Orders

## 2014-11-08 ENCOUNTER — Encounter: Payer: Self-pay | Admitting: Internal Medicine

## 2014-11-28 ENCOUNTER — Encounter: Payer: Self-pay | Admitting: Physician Assistant

## 2014-11-28 ENCOUNTER — Ambulatory Visit (INDEPENDENT_AMBULATORY_CARE_PROVIDER_SITE_OTHER): Payer: BC Managed Care – PPO | Admitting: Physician Assistant

## 2014-11-28 VITALS — BP 144/82 | HR 60 | Ht 70.0 in | Wt 198.0 lb

## 2014-11-28 DIAGNOSIS — Z1211 Encounter for screening for malignant neoplasm of colon: Secondary | ICD-10-CM

## 2014-11-28 NOTE — Progress Notes (Addendum)
Patient ID: [name], male   DOB: 12-Mar-1951, 63 y.o.   MRN: [id number]    HPI: [name] is a 63 year old male referred for evaluation by Dr. Tomi Bamberger for possible colonoscopy.  [name] has a history of coronary artery disease he had an MI with stent placement in 2010. He had a DES placed 05/23/2014 to elevate the. He completed cardiac rehabilitation. He has a history of kidney stones, GERD, hyperlipidemia, and prostate cancer which was diagnosed in 2008 and treated with brachial therapy. He also has a history of anxiety. He reports that he had a colonoscopy in 2005 in [facility]. He does not know the name of the physician who performed the collar anoscopy and does not know which endoscopy center in [facility] he had it performed. He says that he was told he had no polyps.  [name] has had no change in his bowel habits or stool caliber. He has no bloody or tarry stools. He has no anorexia or unexplained weight loss. He denies a family history of colon cancer, colon polyps, or inflammatory bowel disease.  He is currently on Plavix for the DES that he had placed in May 2015.   Past Medical History  Diagnosis Date  . Hyperlipidemia   . ED (erectile dysfunction)   . Irregular heartbeat     started on beta blocker by Dr. Marlou Porch, improved  . Shingles 2010  . Kidney stone 03/2010    08/2014-distal R ureter  . Prostate cancer 01/2008     s/p brachiotherapy; Dr. Karsten Ro  . GERD (gastroesophageal reflux disease)   . CAD (coronary artery disease) 2010    a. s/p DES to LAD 06/2009. b. LHC (05/23/14): LAD proximal to previously placed stent  30%, mid LAD distal to the stent 99%, CFX and RCA No CAD EF 50% with ant HK. PCI:  18 x 2.5 mm diameter Xience Alpine DES to mid LAD.  Marland Kitchen Hypertension   . Inguinal hernia, left 08/2014    seen on CT (containing peritoneal fat)    Past Surgical History  Procedure Laterality Date  . Colonoscopy  2005  . Vasectomy    . Radioactive seed implant  01/2008   prostate cancer  . Coronary angioplasty with stent placement  06/2009    "1"  . Cardiac catheterization  05/23/2014  . Inguinal hernia repair Left 10/1998   Family History  Problem Relation Age of Onset  . Heart disease Father 66  . Prostate cancer Father 20  . Hypertension Father   . Hyperlipidemia Father   . Bladder Cancer Mother   . Eating disorder Mother   . Hodgkin's lymphoma Daughter   . Cardiomyopathy Daughter     related to treatment for lymphoma  . Thyroid cancer Daughter   . Heart disease Daughter     congenital ASD, s/p repair  . Prostate cancer Paternal Grandfather   . Diabetes Neg Hx   . Heart disease Brother     arrhythmia   History  Substance Use Topics  . Smoking status: Former Smoker    Types: Cigarettes, Pipe, Cigars    Quit date: 12/29/1976  . Smokeless tobacco: Never Used     Comment: distant tobacco h/o in college (2-3 packs/week); +pipe/cigar use occ  x 15 years; quit  in the 1990's  . Alcohol Use: 3.0 oz/week    5 Cans of beer per week     Comment: 3-4 beers per week   Current Outpatient Prescriptions  Medication Sig Dispense Refill  .  aspirin 81 MG tablet Take 81 mg by mouth daily.      Marland Kitchen atorvastatin (LIPITOR) 40 MG tablet Take 1 tablet (40 mg total) by mouth daily. 90 tablet 1  . carvedilol (COREG) 3.125 MG tablet Take 1 tablet (3.125 mg total) by mouth 2 (two) times daily. 60 tablet 6  . citalopram (CELEXA) 40 MG tablet Take 1 tablet (40 mg total) by mouth daily. 90 tablet 0  . clopidogrel (PLAVIX) 75 MG tablet Take 1 tablet (75 mg total) by mouth daily with breakfast. 30 tablet 11  . diphenhydrAMINE (BENADRYL) 25 MG tablet Take 50 mg by mouth every evening.    . fish oil-omega-3 fatty acids 1000 MG capsule Take 1 g by mouth daily.      . pantoprazole (PROTONIX) 40 MG tablet Take 1 tablet (40 mg total) by mouth daily. 30 tablet 11  . vardenafil (LEVITRA) 20 MG tablet Take 20 mg by mouth daily as needed for erectile dysfunction.     . Vitamin D,  Ergocalciferol, (DRISDOL) 50000 UNITS CAPS capsule Take 1 capsule (50,000 Units total) by mouth every 7 (seven) days. 12 capsule 0   No current facility-administered medications for this visit.   Allergies  Allergen Reactions  . Imdur [Isosorbide Mononitrate] Rash     Review of Systems: Gen: Denies any fever, chills, sweats, anorexia, fatigue, weakness, malaise, weight loss, and sleep disorder CV: Denies chest pain, angina, palpitations, syncope, orthopnea, PND, peripheral edema, and claudication. Resp: Denies dyspnea at rest, dyspnea with exercise, cough, sputum, wheezing, coughing up blood, and pleurisy. GI: Denies vomiting blood, jaundice, and fecal incontinence.   Denies dysphagia or odynophagia. GU : Denies urinary burning, blood in urine, urinary frequency, urinary hesitancy, nocturnal urination, and urinary incontinence. MS: Denies joint pain, limitation of movement, and swelling, stiffness, low back pain, extremity pain. Denies muscle weakness, cramps, atrophy.  Derm: Denies rash, itching, dry skin, hives, moles, warts, or unhealing ulcers.  Psych: Denies depression, anxiety, memory loss, suicidal ideation, hallucinations, paranoia, and confusion. Heme: Denies bruising, bleeding, and enlarged lymph nodes. Neuro:  Denies any headaches, dizziness, paresthesias. Endo:  Denies any problems with DM, thyroid, adrenal function   Physical Exam: BP 144/82 mmHg  Pulse 60  Ht 5\' 10"  (1.778 m)  Wt 198 lb (89.812 kg)  BMI 28.41 kg/m2 Constitutional: Pleasant,well-developed, male in no acute distress. HEENT: Normocephalic and atraumatic. Conjunctivae are normal. No scleral icterus. Neck supple.  Cardiovascular: Normal rate, regular rhythm.  Pulmonary/chest: Effort normal and breath sounds normal. No wheezing, rales or rhonchi. Abdominal: Soft, nondistended, nontender. Bowel sounds active throughout. There are no masses palpable. No hepatomegaly. Extremities: no edema Lymphadenopathy:  No cervical adenopathy noted. Neurological: Alert and oriented to person place and time. Skin: Skin is warm and dry. No rashes noted. Psychiatric: Normal mood and affect. Behavior is normal.  ASSESSMENT AND PLAN: Asymptomatic 63 year old male with no family history of colorectal cancer referred for evaluation for colonoscopy. A considerable amount of time has been spent explaining the patient's options. It was explained to the patient that he could have a colonoscopy on Plavix, with the understanding that large polyps would not be be removed and he would need to repeat his colonoscopy at a later date off Plavix to have any large polyps removed. We also discussed colo guard as well as virtual colonoscopy. I have also explained to the patient that since he is asymptomatic at this time and has no family history of colorectal cancer, it would be reasonable to wait until  May 2016 at which time he will have completed a year of antiplatelet therapy. At that time we could contact his cardiologist to see if it would be possible for the patient to discontinue his Plavix for several days prior to the procedure. The patient prefers this last option and has been put on the recall list for May 2016.  Rickiya Picariello, Vita Barley PA-C 11/28/2014, 11:35 AM   Addendum: Reviewed and agree with initial management. Jerene Bears, MD

## 2014-11-28 NOTE — Patient Instructions (Signed)
You are due for your recall colon in May, 2016.  You will receive a letter ahead of time to remind you to call and schedule an office visit to schedule it.

## 2014-12-07 ENCOUNTER — Encounter (HOSPITAL_COMMUNITY): Payer: Self-pay | Admitting: Cardiology

## 2015-01-11 ENCOUNTER — Encounter: Payer: BC Managed Care – PPO | Admitting: Internal Medicine

## 2015-02-08 ENCOUNTER — Encounter: Payer: BC Managed Care – PPO | Admitting: Family Medicine

## 2015-02-21 ENCOUNTER — Encounter: Payer: Self-pay | Admitting: Cardiology

## 2015-02-21 ENCOUNTER — Ambulatory Visit (INDEPENDENT_AMBULATORY_CARE_PROVIDER_SITE_OTHER): Payer: BLUE CROSS/BLUE SHIELD | Admitting: Cardiology

## 2015-02-21 VITALS — BP 134/70 | HR 56 | Ht 70.0 in | Wt 199.8 lb

## 2015-02-21 DIAGNOSIS — I10 Essential (primary) hypertension: Secondary | ICD-10-CM

## 2015-02-21 DIAGNOSIS — I2510 Atherosclerotic heart disease of native coronary artery without angina pectoris: Secondary | ICD-10-CM

## 2015-02-21 DIAGNOSIS — I2583 Coronary atherosclerosis due to lipid rich plaque: Principal | ICD-10-CM

## 2015-02-21 DIAGNOSIS — E7800 Pure hypercholesterolemia, unspecified: Secondary | ICD-10-CM

## 2015-02-21 MED ORDER — CLOPIDOGREL BISULFATE 75 MG PO TABS: ORAL_TABLET | ORAL | Status: DC

## 2015-02-21 NOTE — Patient Instructions (Signed)
**Note De-Identified [name] Obfuscation** Your physician has recommended you make the following change in your medication: stop taking Plavix on May 30, 2015 but continue to take Aspirin daily as directed.  Your physician recommends that you schedule a follow-up appointment in: 1 year

## 2015-02-21 NOTE — Addendum Note (Signed)
Addended by: Dennie Fetters on: 02/21/2015 09:10 AM   Modules accepted: Orders

## 2015-02-21 NOTE — Progress Notes (Signed)
[facility]. [address], [place]  [address] Phone: [phone] Fax:  [phone]  Date:  02/21/2015   ID:  [name], DOB 09-08-1951, MRN [id number]  PCP:  Vikki Ports, MD   History of Present Illness: [name] is a 64 y.o. male with coronary artery disease status post DES to LAD and 06/2009 as well as DES to LAD 05/23/14 distal to previously placed stent with hypertension, hyperlipidemia, anxiety here for followup.  Overall feeling well without any exertional angina. He does have some dyspnea on exertion however. He had a kidney stone that has resolved. He wonders if this is increasing blood pressure.  Celexa working well for him. Cardiac maintenance program. He wishes to be off of medications if possible. Beta blocker, carvedilol 3.125 twice a day was added in September of 2015 as a trial. His blood pressures are better controlled. At home usually 650 systolic. overall he is doing well.  Studies:  - LHC (05/23/14): LAD proximal to previously placed stent 30%, mid LAD distal to the stent 99%, CFX and RCA No CAD EF 50% with ant HK. PCI: 18 x 2.5 mm diameter Xience Alpine DES to mid LAD.  - Nuclear (04/26/14): High risk stress nuclear study . There is a small but severe area of ischemia in the distal anterior wall. EF 46%. Ant HK.   Recent Labs:  11/03/2013: ALT 37; HDL Cholesterol by NMR 41; LDL (calc) 51  05/25/2014: Creatinine 1.02; Hemoglobin 14.1; Potassium 4.6       Wt Readings from Last 3 Encounters:  02/21/15 199 lb 12.8 oz (90.629 kg)  11/28/14 198 lb (89.812 kg)  11/06/14 196 lb (88.905 kg)     Past Medical History  Diagnosis Date  . Hyperlipidemia   . ED (erectile dysfunction)   . Irregular heartbeat     started on beta blocker by Dr. Marlou Porch, improved  . Shingles 2010  . Kidney stone 03/2010    08/2014-distal R ureter  . Prostate cancer 01/2008     s/p brachiotherapy; Dr. Karsten Ro  . GERD (gastroesophageal reflux disease)   . CAD  (coronary artery disease) 2010    a. s/p DES to LAD 06/2009. b. LHC (05/23/14): LAD proximal to previously placed stent  30%, mid LAD distal to the stent 99%, CFX and RCA No CAD EF 50% with ant HK. PCI:  18 x 2.5 mm diameter Xience Alpine DES to mid LAD.  Marland Kitchen Hypertension   . Inguinal hernia, left 08/2014    seen on CT (containing peritoneal fat)    Past Surgical History  Procedure Laterality Date  . Colonoscopy  2005  . Vasectomy    . Radioactive seed implant  01/2008    prostate cancer  . Coronary angioplasty with stent placement  06/2009    "1"  . Cardiac catheterization  05/23/2014  . Inguinal hernia repair Left 10/1998  . Left heart catheterization with coronary angiogram N/A 05/23/2014    Procedure: LEFT HEART CATHETERIZATION WITH CORONARY ANGIOGRAM;  Surgeon: Candee Furbish, MD;  Location: [facility] CATH LAB;  Service: Cardiovascular;  Laterality: N/A;  . Percutaneous coronary stent intervention (pci-s) N/A 05/24/2014    Procedure: PERCUTANEOUS CORONARY STENT INTERVENTION (PCI-S);  Surgeon: Sinclair Grooms, MD;  Location: [facility] CATH LAB;  Service: Cardiovascular;  Laterality: N/A;    Current Outpatient Prescriptions  Medication Sig Dispense Refill  . aspirin 81 MG tablet Take 81 mg by mouth daily.      Marland Kitchen  atorvastatin (LIPITOR) 40 MG tablet Take 1 tablet (40 mg total) by mouth daily. 90 tablet 1  . carvedilol (COREG) 3.125 MG tablet Take 1 tablet (3.125 mg total) by mouth 2 (two) times daily. 60 tablet 6  . citalopram (CELEXA) 40 MG tablet Take 1 tablet (40 mg total) by mouth daily. 90 tablet 0  . clopidogrel (PLAVIX) 75 MG tablet Take 1 tablet (75 mg total) by mouth daily with breakfast. 30 tablet 11  . diphenhydrAMINE (BENADRYL) 25 MG tablet Take 50 mg by mouth every evening.    . fish oil-omega-3 fatty acids 1000 MG capsule Take 1 g by mouth daily.      . pantoprazole (PROTONIX) 40 MG tablet Take 1 tablet (40 mg total) by mouth daily. 30 tablet 11  . vardenafil (LEVITRA) 20 MG tablet Take 20 mg  by mouth daily as needed for erectile dysfunction.      No current facility-administered medications for this visit.    Allergies:    Allergies  Allergen Reactions  . Imdur [Isosorbide Mononitrate] Rash    Social History:  The patient  reports that he quit smoking about 38 years ago. His smoking use included Cigarettes, Pipe, and Cigars. He has never used smokeless tobacco. He reports that he drinks about 3.0 oz of alcohol per week. He reports that he does not use illicit drugs.   Works at [employer] History  Problem Relation Age of Onset  . Heart disease Father 31  . Prostate cancer Father 37  . Hypertension Father   . Hyperlipidemia Father   . Bladder Cancer Mother   . Eating disorder Mother   . Hodgkin's lymphoma Daughter   . Cardiomyopathy Daughter     related to treatment for lymphoma  . Thyroid cancer Daughter   . Heart disease Daughter     congenital ASD, s/p repair  . Prostate cancer Paternal Grandfather   . Diabetes Neg Hx   . Heart disease Brother     arrhythmia    ROS:  Please see the history of present illness. Positive for leg swelling, shortness of breath with activity, blood in urine, rash  Denies any fevers, chills, orthopnea, PND, syncope.   All other systems reviewed and negative.   PHYSICAL EXAM: VS:  BP 134/70 mmHg  Pulse 56  Ht 5\' 10"  (1.778 m)  Wt 199 lb 12.8 oz (90.629 kg)  BMI 28.67 kg/m2  SpO2 95% Well nourished, well developed, in no acute distress HEENT: normal, [place]/AT, EOMI Neck: no JVD, normal carotid upstroke, no bruit Cardiac:  normal S1, S2; RRR; no murmur Lungs:  clear to auscultation bilaterally, no wheezing, rhonchi or rales Abd: soft, nontender, no hepatomegaly, no bruits Ext: no edema, 2+ distal pulses Skin: warm and dry GU: deferred Neuro: no focal abnormalities noted, AAO x 3  EKG:  None today     ASSESSMENT AND PLAN:  1. Coronary artery disease-2 DES and LAD placed in 2010 and 05/24/2014. Needs one year total of dual  antiplatelet therapy. Reviewed. Continue with aggressive secondary prevention. I'm comfortable with him stopping his Plavix late May/June 1 after one year completion. I discussed this with him. I would be fine with him calling us to let us know that he is doing it  2. Hypertension-recently blood pressure has been quite labile. Excellent today. Sometimes in 150's. Previously he was on metoprolol. May have felt some fatigue on this medication although after stopping beta blocker, fatigue is not completely resolved. Ultimately, I am pleased  with some of his progress with carvedilol low dose. Heart rate does not tolerate increase dose. If necessary, we can add ACE inhibitor in the future. He will monitor his blood pressures at home. Overall doing well.  3. Hyperlipidemia-continue with atorvastatin 40 mg. LDL goal less than 70. last check 43.  4. We will see back in one year   Signed, Candee Furbish, MD [facility]  02/21/2015 8:53 AM

## 2015-03-09 ENCOUNTER — Encounter: Payer: Self-pay | Admitting: Internal Medicine

## 2015-03-16 ENCOUNTER — Telehealth: Payer: Self-pay | Admitting: Internal Medicine

## 2015-03-16 DIAGNOSIS — F411 Generalized anxiety disorder: Secondary | ICD-10-CM

## 2015-03-16 MED ORDER — CITALOPRAM HYDROBROMIDE 40 MG PO TABS: 40.0000 mg | ORAL_TABLET | Freq: Every day | ORAL | Status: DC

## 2015-03-16 NOTE — Telephone Encounter (Signed)
Sent!

## 2015-03-16 NOTE — Telephone Encounter (Signed)
Refill request for citalopram 40mg  #90 to [facility]

## 2015-03-16 NOTE — Telephone Encounter (Signed)
Ok to refill until November appt

## 2015-04-09 ENCOUNTER — Other Ambulatory Visit: Payer: Self-pay | Admitting: Cardiology

## 2015-04-16 ENCOUNTER — Ambulatory Visit (INDEPENDENT_AMBULATORY_CARE_PROVIDER_SITE_OTHER): Payer: BLUE CROSS/BLUE SHIELD | Admitting: Family Medicine

## 2015-04-16 ENCOUNTER — Encounter: Payer: Self-pay | Admitting: Family Medicine

## 2015-04-16 VITALS — BP 138/78 | HR 52 | Temp 97.3°F | Ht 71.0 in | Wt 201.2 lb

## 2015-04-16 DIAGNOSIS — L300 Nummular dermatitis: Secondary | ICD-10-CM

## 2015-04-16 MED ORDER — TRIAMCINOLONE ACETONIDE 0.1 % EX CREA: TOPICAL_CREAM | CUTANEOUS | Status: DC

## 2015-04-16 NOTE — Progress Notes (Signed)
Chief Complaint  Patient presents with  . Rash    been there for a while and has worsened recently-very itchy. You gave him rx many years ago and it helped.   . Nevus    over left eye, not bothering him but has been there for a year and he would like you to take a look at.    He has had the rash for several years.  It isn't seasonal; no new exposures.  He has had the rash for years, previously treated with topical steroids.  The rash has been flaring over the last 2-3 months. He ran out of the prescription steroid cream he was previously given.  It is itchy. No bleeding, pain, swelling, crusting.  He has also just noticed a slightly circular area come up on his right forearm.  No pets or exposures to ringworm.  He is also wanting a mole checked above his left eyebrow.  He states the large one on his cheek hasn't changed at all, nor has the one within the eyebrow.  There is now a smaller, flat light brown area above the eyebrow that he would like checked.  He has never seen a dermatologist in the past.  PMH, [place], [place] reviewed.  ROS:  No fevers, chills, URI or allergy symptoms, chest pain, shortness of breath or other problems.  PHYSICAL EXAM: BP 138/78 mmHg  Pulse 52  Temp(Src) 97.3 F (36.3 C) (Tympanic)  Ht 5\' 11"  (1.803 m)  Wt 201 lb 3.2 oz (91.264 kg)  BMI 28.07 kg/m2 Well developed, pleasant male in no distress Skin: Small flat light brown area just above a larger, raised, lightly pigmented area at the left lateral eyebrow.  He states the one within the eyebrow is unchanged/stable. Left cheek--unchanged per pt.  4 scattered raised round rough plaques on the left calf (none anterior, scattered across posterior, medial and lateral calf); there are 2 similar but smaller plaques on the right posterior calf.  Right anterior forearm--there is just a very subtle hint of a circular lesion; 2.25 x 3 cm; edges are darker, but still seem flat. Central area has slight flaky/dry  sheen.   ASSESSMENT/PLAN:  Nummular eczema - Plan: triamcinolone cream (KENALOG) 0.1 %   Suspect nummular eczema Treat with TAC 0.1% Moisturize well  Keep an eye on the discoloration of the right forearm--if the edges start to be raised but the central area remains normal in appearance, then consider ringworm as a diagnosis (and treat with antifungal medication such as Lamisil or Lotrimin). If it ends up looking just like the rashes on your legs (all raised, itchy, red), then okay to use the same steroid cream on it.  Reassured about the new concern above his eyebrow.  I recommend that he set up a full skin check with a dermatologist.

## 2015-04-16 NOTE — Patient Instructions (Signed)
Eczema Eczema, also called atopic dermatitis, is a skin disorder that causes inflammation of the skin. It causes a red rash and dry, scaly skin. The skin becomes very itchy. Eczema is generally worse during the cooler winter months and often improves with the warmth of summer. Eczema usually starts showing signs in infancy. Some children outgrow eczema, but it may last through adulthood.  CAUSES  The exact cause of eczema is not known, but it appears to run in families. People with eczema often have a family history of eczema, allergies, asthma, or hay fever. Eczema is not contagious. Flare-ups of the condition may be caused by:   Contact with something you are sensitive or allergic to.   Stress. SIGNS AND SYMPTOMS  Dry, scaly skin.   Red, itchy rash.   Itchiness. This may occur before the skin rash and may be very intense.  DIAGNOSIS  The diagnosis of eczema is usually made based on symptoms and medical history. TREATMENT  Eczema cannot be cured, but symptoms usually can be controlled with treatment and other strategies. A treatment plan might include:  Controlling the itching and scratching.   Use over-the-counter antihistamines as directed for itching. This is especially useful at night when the itching tends to be worse.   Use over-the-counter steroid creams as directed for itching.   Avoid scratching. Scratching makes the rash and itching worse. It may also result in a skin infection (impetigo) due to a break in the skin caused by scratching.   Keeping the skin well moisturized with creams every day. This will seal in moisture and help prevent dryness. Lotions that contain alcohol and water should be avoided because they can dry the skin.   Limiting exposure to things that you are sensitive or allergic to (allergens).   Recognizing situations that cause stress.   Developing a plan to manage stress.  HOME CARE INSTRUCTIONS   Only take over-the-counter or  prescription medicines as directed by your health care provider.   Do not use anything on the skin without checking with your health care provider.   Keep baths or showers short (5 minutes) in warm (not hot) water. Use mild cleansers for bathing. These should be unscented. You may add nonperfumed bath oil to the bath water. It is best to avoid soap and bubble bath.   Immediately after a bath or shower, when the skin is still damp, apply a moisturizing ointment to the entire body. This ointment should be a petroleum ointment. This will seal in moisture and help prevent dryness. The thicker the ointment, the better. These should be unscented.   Keep fingernails cut short. Children with eczema may need to wear soft gloves or mittens at night after applying an ointment.   Dress in clothes made of cotton or cotton blends. Dress lightly, because heat increases itching.   A child with eczema should stay away from anyone with fever blisters or cold sores. The virus that causes fever blisters (herpes simplex) can cause a serious skin infection in children with eczema. SEEK MEDICAL CARE IF:   Your itching interferes with sleep.   Your rash gets worse or is not better within 1 week after starting treatment.   You see pus or soft yellow scabs in the rash area.   You have a fever.   You have a rash flare-up after contact with someone who has fever blisters.  Document Released: 12/12/2000 Document Revised: 10/05/2013 Document Reviewed: 07/18/2013 ExitCare Patient Information 2015 [employer]. This information   is not intended to replace advice given to you by your health care provider. Make sure you discuss any questions you have with your health care provider.  

## 2015-04-17 ENCOUNTER — Encounter: Payer: Self-pay | Admitting: Family Medicine

## 2015-05-10 IMAGING — CT CT ABD-PELV W/ CM
2 of 5 series · 16 of 46 positions shown, 18 images · IV contrast (omnipaque)
Comparison: None.

CLINICAL DATA: Right lower quadrant pain intermittently for 6
weeks. History of right inguinal hernia repair. History of prostate
cancer.

EXAM:
CT ABDOMEN AND PELVIS WITH CONTRAST
TECHNIQUE: Multidetector CT imaging of the abdomen and pelvis was performed
using the standard protocol following bolus administration of
intravenous contrast.
CONTRAST:  100mL OMNIPAQUE IOHEXOL 300 MG/ML  SOLN

[Series 2: abd/pelvis with · axial · 0.75mm/px · z∈[-430,-26]mm · 13 of 91 slices shown, 15 images]
[im 5/91  soft-tissue]
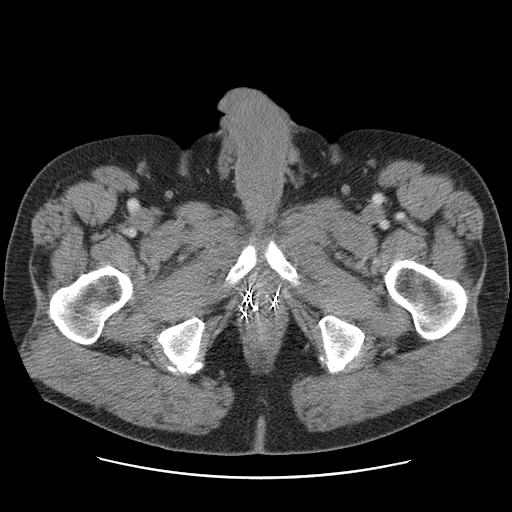
[im 5/91  bone]
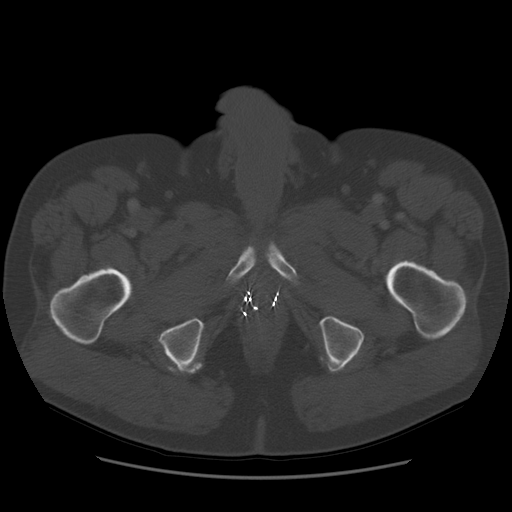
[im 14/91  soft-tissue]
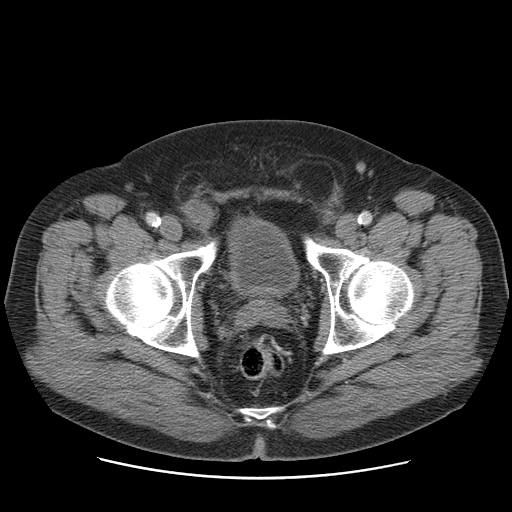
[im 19/91  soft-tissue]
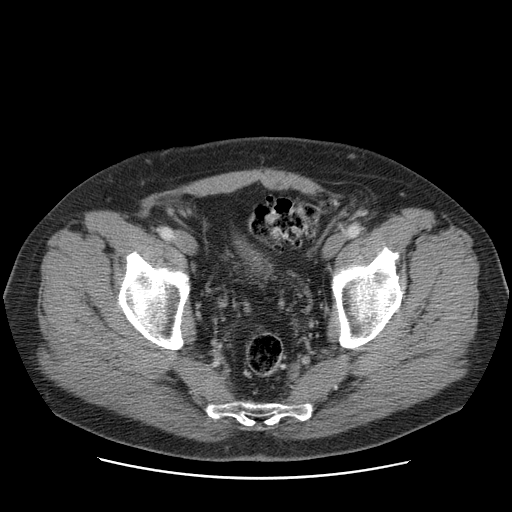
[im 28/91  soft-tissue]
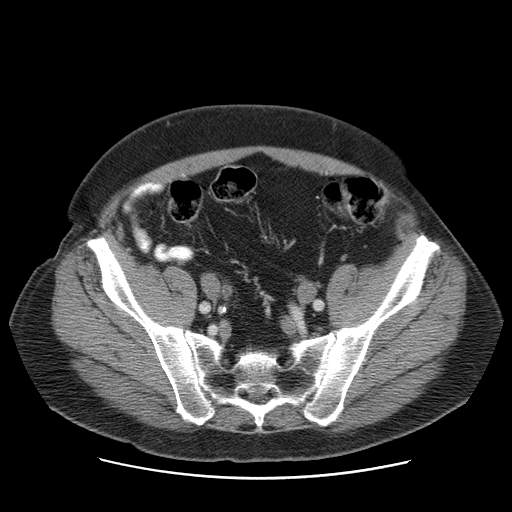
[im 32/91  soft-tissue]
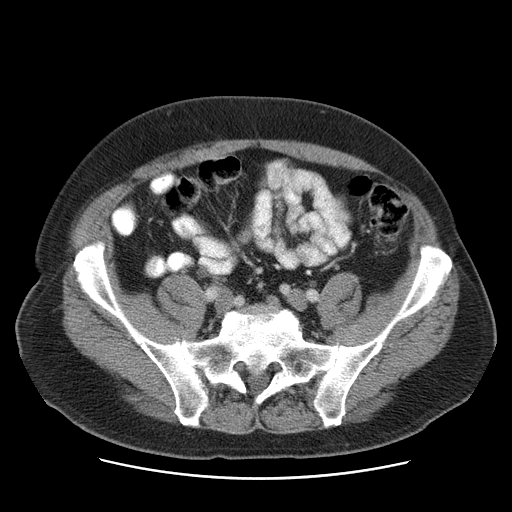
[im 41/91  soft-tissue]
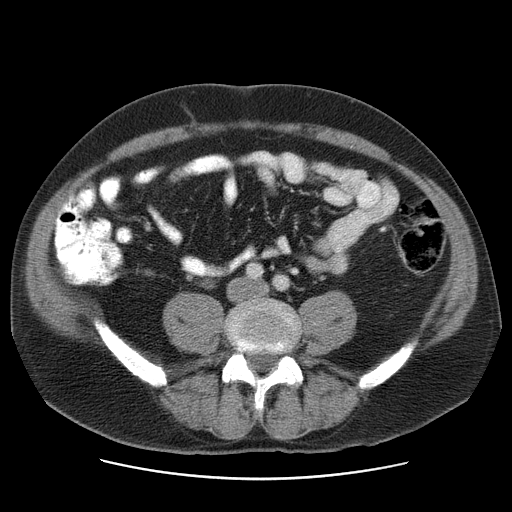
[im 46/91  soft-tissue]
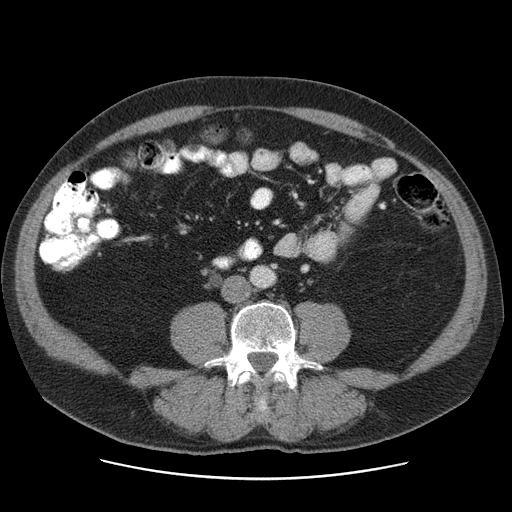
[im 50/91  soft-tissue]
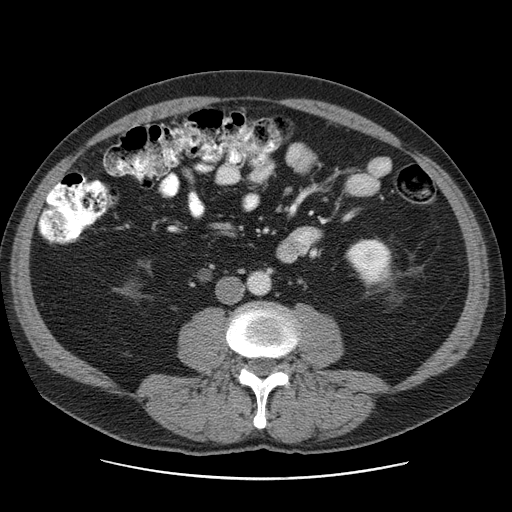
[im 59/91  soft-tissue]
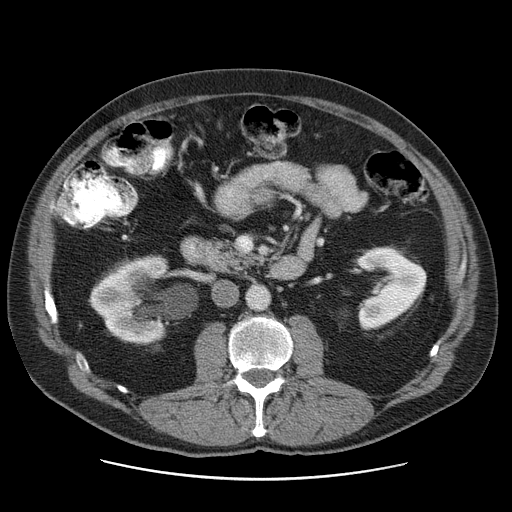
[im 59/91  bone]
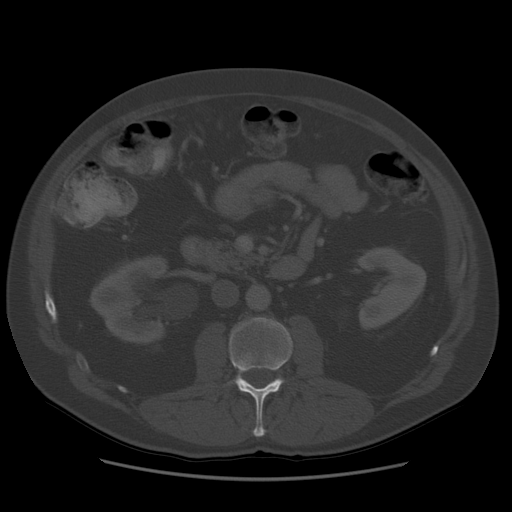
[im 64/91  soft-tissue]
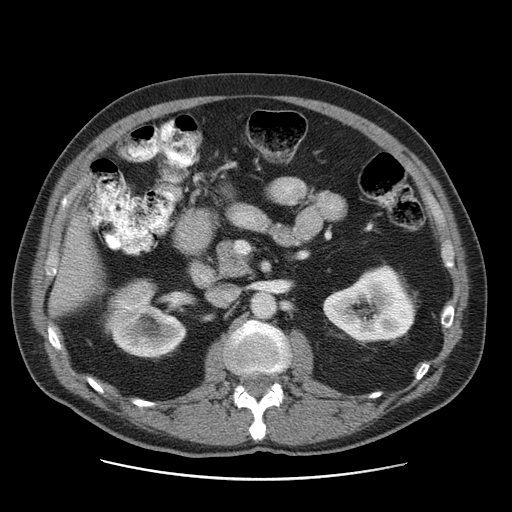
[im 73/91  soft-tissue]
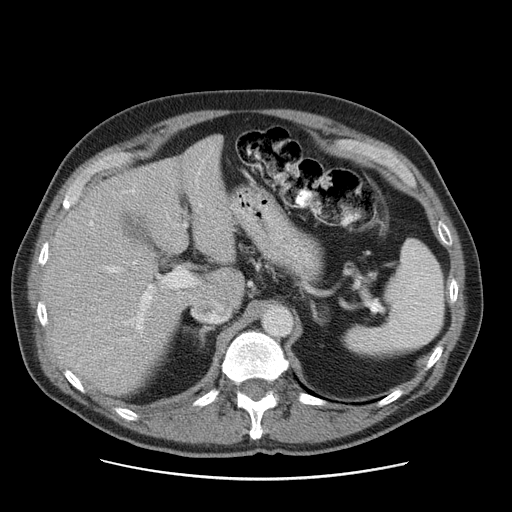
[im 77/91  soft-tissue]
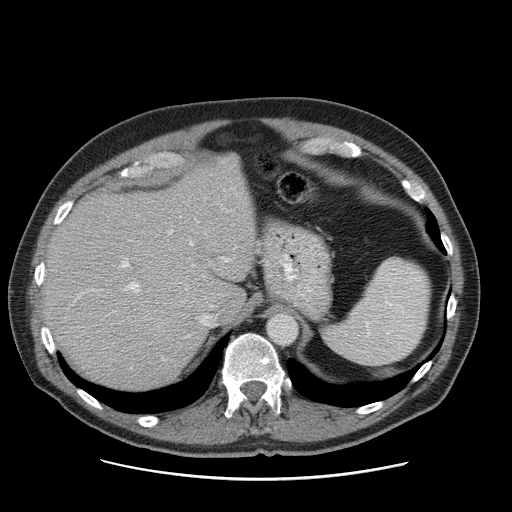
[im 86/91  soft-tissue]
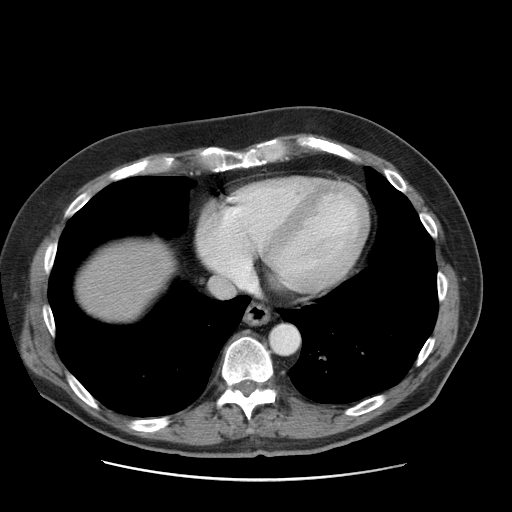

[Series 400: cor · coronal · 0.99mm/px · 3 of 159 slices shown]
[im 53/159  soft-tissue]
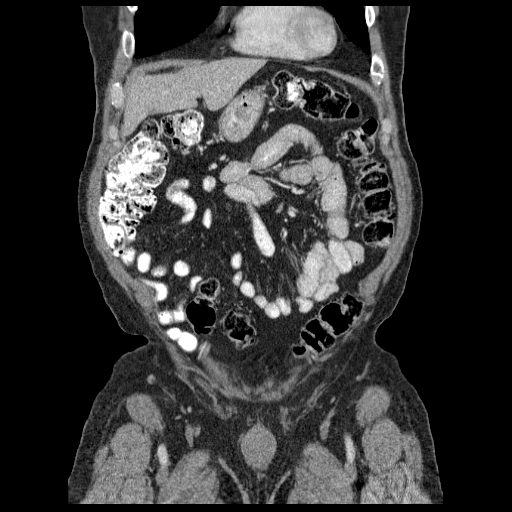
[im 71/159  soft-tissue]
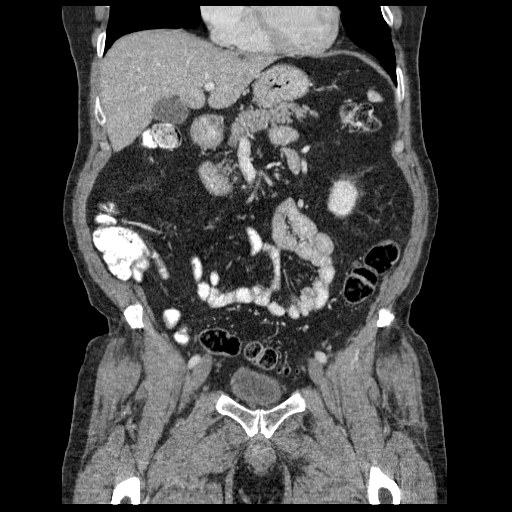
[im 88/159  soft-tissue]
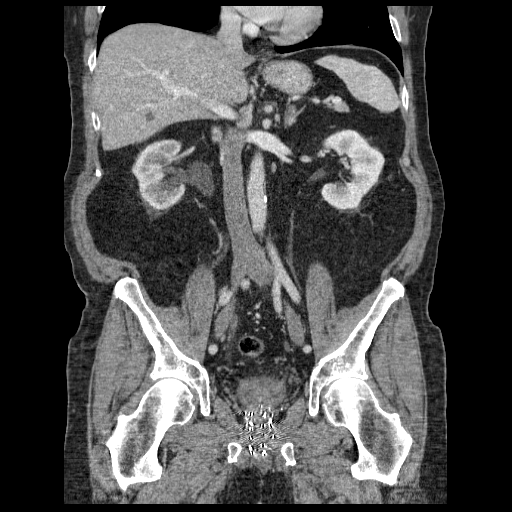

[16 of 46 positions shown; findings below may reference images not displayed]

FINDINGS: Lung bases are normal.

Abdominal images demonstrate 4 small well-defined liver
hypodensities with the largest measuring 1 cm over the left lobe
likely cysts. The spleen, pancreas, gallbladder and adrenal glands
are within normal. There is minimal diverticulosis of the colon. The
appendix is normal. There is minimal calcified plaque over the
abdominal aorta.

Kidneys are normal in size without evidence of nephrolithiasis.
Suggestion of a sub cm hypodensity over the upper pole of the right
kidney too small to characterize but likely a cyst. There is mild
dilatation of the right intrarenal collecting system and ureter due
to an 8 mm stone over the distal right ureter 2.5 cm above the UVJ.
The left ureter is normal.

Pelvic images demonstrate a small left inguinal hernia containing
only peritoneal fat. The bladder and rectum are within normal.
Radiation seed implants are present over the prostate gland. There
are mild age and changes of the spine with disc disease at the L5-S1
level. There are minimal degenerative changes of the hips.
IMPRESSION: 8 mm stone over the distal right ureter approximately 2.5 cm above
the UVJ causing low-grade obstruction.

Four small liver hypodensities with the largest measuring 1 cm over
the left lobe likely cysts.

Suggestion of a sub cm right renal cyst.

Mild diverticulosis of the colon.

Small left inguinal hernia containing only peritoneal fat.

These results will be called to the ordering clinician or
representative by the Radiologist Assistant, and communication
documented in the PACS or zVision Dashboard.

## 2015-05-11 ENCOUNTER — Other Ambulatory Visit: Payer: Self-pay

## 2015-05-11 MED ORDER — CLOPIDOGREL BISULFATE 75 MG PO TABS: ORAL_TABLET | ORAL | Status: DC

## 2015-05-11 MED ORDER — PANTOPRAZOLE SODIUM 40 MG PO TBEC: 40.0000 mg | DELAYED_RELEASE_TABLET | Freq: Every day | ORAL | Status: DC

## 2015-05-14 ENCOUNTER — Other Ambulatory Visit: Payer: Self-pay | Admitting: *Deleted

## 2015-05-14 MED ORDER — ATORVASTATIN CALCIUM 40 MG PO TABS: 40.0000 mg | ORAL_TABLET | Freq: Every day | ORAL | Status: DC

## 2015-05-15 ENCOUNTER — Other Ambulatory Visit: Payer: Self-pay | Admitting: Cardiology

## 2015-08-16 ENCOUNTER — Other Ambulatory Visit: Payer: Self-pay | Admitting: *Deleted

## 2015-08-16 MED ORDER — CARVEDILOL 3.125 MG PO TABS: 3.1250 mg | ORAL_TABLET | Freq: Two times a day (BID) | ORAL | Status: DC

## 2015-09-05 ENCOUNTER — Encounter: Payer: Self-pay | Admitting: Internal Medicine

## 2015-10-30 DIAGNOSIS — K648 Other hemorrhoids: Secondary | ICD-10-CM

## 2015-10-30 DIAGNOSIS — K5790 Diverticulosis of intestine, part unspecified, without perforation or abscess without bleeding: Secondary | ICD-10-CM

## 2015-10-30 DIAGNOSIS — K635 Polyp of colon: Secondary | ICD-10-CM

## 2015-10-30 HISTORY — DX: Polyp of colon: K63.5

## 2015-10-30 HISTORY — DX: Diverticulosis of intestine, part unspecified, without perforation or abscess without bleeding: K57.90

## 2015-10-30 HISTORY — DX: Other hemorrhoids: K64.8

## 2015-11-01 ENCOUNTER — Ambulatory Visit (AMBULATORY_SURGERY_CENTER): Payer: Self-pay | Admitting: *Deleted

## 2015-11-01 VITALS — Ht 71.0 in | Wt 205.8 lb

## 2015-11-01 DIAGNOSIS — Z1211 Encounter for screening for malignant neoplasm of colon: Secondary | ICD-10-CM

## 2015-11-01 MED ORDER — NA SULFATE-K SULFATE-MG SULF 17.5-3.13-1.6 GM/177ML PO SOLN: 1.0000 | Freq: Once | ORAL | Status: DC

## 2015-11-01 NOTE — Progress Notes (Signed)
No egg or soy allergy No issues with past sedation No diet pills No home 02 use  emmi declined [place] colon [place] but [place] where or who -normal per pt.

## 2015-11-08 ENCOUNTER — Encounter: Payer: BC Managed Care – PPO | Admitting: Family Medicine

## 2015-11-11 ENCOUNTER — Other Ambulatory Visit: Payer: Self-pay | Admitting: Cardiology

## 2015-11-12 ENCOUNTER — Other Ambulatory Visit: Payer: Self-pay

## 2015-11-12 MED ORDER — ATORVASTATIN CALCIUM 40 MG PO TABS: 40.0000 mg | ORAL_TABLET | Freq: Every day | ORAL | Status: DC

## 2015-11-12 NOTE — Telephone Encounter (Signed)
Pt called and requested a refill for Atorvastatin 40 mg. He was asked to come back in a year for f/u I sent in for 90 days with 0 refills and let the pt know he will need to make an appointment to get more refills.

## 2015-11-15 ENCOUNTER — Encounter: Payer: Self-pay | Admitting: Internal Medicine

## 2015-11-15 ENCOUNTER — Ambulatory Visit (AMBULATORY_SURGERY_CENTER): Payer: PPO | Admitting: Internal Medicine

## 2015-11-15 VITALS — BP 158/77 | HR 53 | Temp 96.9°F | Resp 11 | Ht 71.0 in | Wt 205.0 lb

## 2015-11-15 DIAGNOSIS — D120 Benign neoplasm of cecum: Secondary | ICD-10-CM | POA: Diagnosis not present

## 2015-11-15 DIAGNOSIS — Z1211 Encounter for screening for malignant neoplasm of colon: Secondary | ICD-10-CM | POA: Diagnosis not present

## 2015-11-15 DIAGNOSIS — D128 Benign neoplasm of rectum: Secondary | ICD-10-CM

## 2015-11-15 DIAGNOSIS — D122 Benign neoplasm of ascending colon: Secondary | ICD-10-CM

## 2015-11-15 MED ORDER — SODIUM CHLORIDE 0.9 % IV SOLN: 500.0000 mL | INTRAVENOUS | Status: DC

## 2015-11-15 NOTE — Patient Instructions (Signed)
YOU HAD AN ENDOSCOPIC PROCEDURE TODAY AT [facility]:   Refer to the procedure report that was given to you for any specific questions about what was found during the examination.  If the procedure report does not answer your questions, please call your gastroenterologist to clarify.  If you requested that your care partner not be given the details of your procedure findings, then the procedure report has been included in a sealed envelope for you to review at your convenience later.  YOU SHOULD EXPECT: Some feelings of bloating in the abdomen. Passage of more gas than usual.  Walking can help get rid of the air that was put into your GI tract during the procedure and reduce the bloating. If you had a lower endoscopy (such as a colonoscopy or flexible sigmoidoscopy) you may notice spotting of blood in your stool or on the toilet paper. If you underwent a bowel prep for your procedure, you may not have a normal bowel movement for a few days.  Please Note:  You might notice some irritation and congestion in your nose or some drainage.  This is from the oxygen used during your procedure.  There is no need for concern and it should clear up in a day or so.  SYMPTOMS TO REPORT IMMEDIATELY:   Following lower endoscopy (colonoscopy or flexible sigmoidoscopy):  Excessive amounts of blood in the stool  Significant tenderness or worsening of abdominal pains  Swelling of the abdomen that is new, acute  Fever of 100F or higher   For urgent or emergent issues, a gastroenterologist can be reached at any hour by calling [phone].   DIET: Your first meal following the procedure should be a small meal and then it is ok to progress to your normal diet. Heavy or fried foods are harder to digest and may make you feel nauseous or bloated.  Likewise, meals heavy in dairy and vegetables can increase bloating.  Drink plenty of fluids but you should avoid alcoholic beverages for 24  hours.  ACTIVITY:  You should plan to take it easy for the rest of today and you should NOT DRIVE or use heavy machinery until tomorrow (because of the sedation medicines used during the test).    FOLLOW UP: Our staff will call the number listed on your records the next business day following your procedure to check on you and address any questions or concerns that you may have regarding the information given to you following your procedure. If we do not reach you, we will leave a message.  However, if you are feeling well and you are not experiencing any problems, there is no need to return our call.  We will assume that you have returned to your regular daily activities without incident.  If any biopsies were taken you will be contacted by phone or by letter within the next 1-3 weeks.  Please call us at [phone] if you have not heard about the biopsies in 3 weeks.    SIGNATURES/CONFIDENTIALITY: You and/or your care partner have signed paperwork which will be entered into your electronic medical record.  These signatures attest to the fact that that the information above on your After Visit Summary has been reviewed and is understood.  Full responsibility of the confidentiality of this discharge information lies with you and/or your care-partner.  Polyps/diverticulosis handout given Await pathology results

## 2015-11-15 NOTE — Progress Notes (Signed)
Called to room to assist during endoscopic procedure.  Patient ID and intended procedure confirmed with present staff. Received instructions for my participation in the procedure from the performing physician.  

## 2015-11-15 NOTE — Op Note (Signed)
[facility]  [address]. [place], [id number]   COLONOSCOPY PROCEDURE REPORT  PATIENT: [name], [name]  MR#: [id number] BIRTHDATE: [birth year] , 54  yrs. old GENDER: male ENDOSCOPIST: Jerene Bears, MD REFERRED Terrall Laity, M.D. PROCEDURE DATE:  11/15/2015 PROCEDURE:   Colonoscopy, screening and Colonoscopy with snare polypectomy First Screening Colonoscopy - Avg.  risk and is 50 yrs.  old or older - No.  Prior Negative Screening - Now for repeat screening. 10 or more years since last screening  History of Adenoma - Now for follow-up colonoscopy & has been > or = to 3 yrs.  N/A  Polyps removed today? Yes ASA CLASS:   Class III INDICATIONS:Screening for colonic neoplasia, Colorectal Neoplasm Risk Assessment for this procedure is average risk, and Colonoscopy 2005 [facility], [place]). MEDICATIONS: Monitored anesthesia care and Propofol 200 mg IV  DESCRIPTION OF PROCEDURE:   After the risks benefits and alternatives of the procedure were thoroughly explained, informed consent was obtained.  The digital rectal exam revealed no rectal mass.   The [id number]  endoscope was introduced through the anus and advanced to the cecum, which was identified by both the appendix and ileocecal valve. No adverse events experienced. The quality of the prep was excellent.  (Suprep was used)  The instrument was then slowly withdrawn as the colon was fully examined. Estimated blood loss is zero unless otherwise noted in this procedure report.      COLON FINDINGS: A sessile polyp measuring 5 mm in size was found at the ileocecal valve.  A polypectomy was performed with a cold snare.  The resection was complete, the polyp tissue was completely retrieved and sent to histology.   A sessile polyp measuring 6 mm in size was found in the ascending colon.  A polypectomy was performed with a cold snare.  The resection was complete, the polyp tissue was completely retrieved and  sent to histology.   Two sessile polyps 3 mm and 8 mm in size were found in the rectum. Polypectomies were performed using snare cautery (1) and with a cold snare (1).  The resection was complete, the polyp tissue was completely retrieved and sent to histology.   There was moderate diverticulosis noted in the left colon.  Retroflexed views revealed internal hemorrhoids. The time to cecum = 3.8 Withdrawal time = 13.6   The scope was withdrawn and the procedure completed. COMPLICATIONS: There were no immediate complications.  ENDOSCOPIC IMPRESSION: 1.   Sessile polyp was found at the ileocecal valve; polypectomy was performed with a cold snare 2.   Sessile polyp was found in the ascending colon; polypectomy was performed with a cold snare 3.   Two sessile polyps ranging from 3 to 64mm in size were found in the rectum; polypectomies were performed using snare cautery and with a cold snare 4.   Moderate diverticulosis was noted in the left colon  RECOMMENDATIONS: 1.  Await pathology results 2.  High fiber diet 3.  If the polyps removed today are proven to be adenomatous (pre-cancerous) polyps, you will need a colonoscopy in 3 years. Otherwise you should continue to follow colorectal cancer screening guidelines for "routine risk" patients with a colonoscopy in 10 years.  You will receive a letter within 1-2 weeks with the results of your biopsy as well as final recommendations.  Please call my office if you have not received a letter after 3 weeks.  eSigned:  Jerene Bears, MD 11/15/2015 11:34 AM  cc: The Patient and Rita Ohara, MD   PATIENT NAME:  [name], [name] MR#: [id number]

## 2015-11-15 NOTE — Progress Notes (Signed)
Report to PACU, RN, vss, BBS= Clear.  

## 2015-11-16 ENCOUNTER — Telehealth: Payer: Self-pay | Admitting: *Deleted

## 2015-11-16 NOTE — Telephone Encounter (Signed)
No answer, left message to call if questions or concerns. 

## 2015-11-16 NOTE — Telephone Encounter (Signed)
  Follow up Call- no answer, left message to call if question or concern.

## 2015-11-20 ENCOUNTER — Encounter: Payer: Self-pay | Admitting: Family Medicine

## 2015-11-20 ENCOUNTER — Encounter: Payer: Self-pay | Admitting: Internal Medicine

## 2015-11-27 ENCOUNTER — Telehealth: Payer: Self-pay

## 2015-11-27 DIAGNOSIS — E7800 Pure hypercholesterolemia, unspecified: Secondary | ICD-10-CM

## 2015-11-27 DIAGNOSIS — C61 Malignant neoplasm of prostate: Secondary | ICD-10-CM

## 2015-11-27 DIAGNOSIS — I2510 Atherosclerotic heart disease of native coronary artery without angina pectoris: Secondary | ICD-10-CM

## 2015-11-27 DIAGNOSIS — I2583 Coronary atherosclerosis due to lipid rich plaque: Secondary | ICD-10-CM

## 2015-11-27 DIAGNOSIS — Z5181 Encounter for therapeutic drug level monitoring: Secondary | ICD-10-CM

## 2015-11-27 DIAGNOSIS — R5383 Other fatigue: Secondary | ICD-10-CM

## 2015-11-27 DIAGNOSIS — I10 Essential (primary) hypertension: Secondary | ICD-10-CM

## 2015-11-27 DIAGNOSIS — E559 Vitamin D deficiency, unspecified: Secondary | ICD-10-CM

## 2015-11-27 DIAGNOSIS — I255 Ischemic cardiomyopathy: Secondary | ICD-10-CM

## 2015-11-27 NOTE — Telephone Encounter (Signed)
He has a physical Thursday and wants to know if he can come in tomorrow for labs.

## 2015-11-27 NOTE — Telephone Encounter (Addendum)
Pt informed and put on lab schedule for tomorrow. He has already seen Dr. Karsten Ro so the PSA needs to be removed.

## 2015-11-27 NOTE — Telephone Encounter (Signed)
Orders entered.  Okay to schedule for labs. Let him know that I ordered his PSA (I have been doing it, and forwarding it to his urologist, but if he recently saw Dr. Karsten Ro and had it done there, then we would need to cancel the PSA). I haven't received anything from Dr. Karsten Ro so suspect he hasn't seen him yet, but just wanted to verify and let him know that I was planning to order.

## 2015-11-27 NOTE — Telephone Encounter (Signed)
Did you confirm that he had PSA done through Dr. Karsten Ro (or just that he saw him?). If no labs were done, then we still draw, and forward the results to him. If he had labs drawn for Dr. Karsten Ro, then please delete the future order.  You should be able to delete the future order for the PSA (listed as Medicare PSA) if it needs to be deleted

## 2015-11-28 ENCOUNTER — Other Ambulatory Visit: Payer: Self-pay | Admitting: Family Medicine

## 2015-11-28 ENCOUNTER — Other Ambulatory Visit: Payer: PPO

## 2015-11-28 DIAGNOSIS — Z5181 Encounter for therapeutic drug level monitoring: Secondary | ICD-10-CM

## 2015-11-28 DIAGNOSIS — E559 Vitamin D deficiency, unspecified: Secondary | ICD-10-CM

## 2015-11-28 DIAGNOSIS — I2583 Coronary atherosclerosis due to lipid rich plaque: Principal | ICD-10-CM

## 2015-11-28 DIAGNOSIS — R5383 Other fatigue: Secondary | ICD-10-CM

## 2015-11-28 DIAGNOSIS — C61 Malignant neoplasm of prostate: Secondary | ICD-10-CM

## 2015-11-28 DIAGNOSIS — I255 Ischemic cardiomyopathy: Secondary | ICD-10-CM

## 2015-11-28 DIAGNOSIS — I2510 Atherosclerotic heart disease of native coronary artery without angina pectoris: Secondary | ICD-10-CM

## 2015-11-28 DIAGNOSIS — E7800 Pure hypercholesterolemia, unspecified: Secondary | ICD-10-CM

## 2015-11-28 DIAGNOSIS — I10 Essential (primary) hypertension: Secondary | ICD-10-CM

## 2015-11-28 LAB — COMPREHENSIVE METABOLIC PANEL
ALK PHOS: 52 U/L (ref 40–115)
ALT: 24 U/L (ref 9–46)
AST: 23 U/L (ref 10–35)
Albumin: 4 g/dL (ref 3.6–5.1)
BUN: 16 mg/dL (ref 7–25)
CALCIUM: 8.9 mg/dL (ref 8.6–10.3)
CHLORIDE: 105 mmol/L (ref 98–110)
CO2: 26 mmol/L (ref 20–31)
Creat: 1 mg/dL (ref 0.70–1.25)
GLUCOSE: 83 mg/dL (ref 65–99)
POTASSIUM: 3.9 mmol/L (ref 3.5–5.3)
Sodium: 140 mmol/L (ref 135–146)
Total Bilirubin: 0.8 mg/dL (ref 0.2–1.2)
Total Protein: 6.4 g/dL (ref 6.1–8.1)

## 2015-11-28 LAB — CBC WITH DIFFERENTIAL/PLATELET
BASOS PCT: 0 % (ref 0–1)
Basophils Absolute: 0 10*3/uL (ref 0.0–0.1)
Eosinophils Absolute: 0.2 10*3/uL (ref 0.0–0.7)
Eosinophils Relative: 3 % (ref 0–5)
HCT: 44.1 % (ref 39.0–52.0)
HEMOGLOBIN: 14.9 g/dL (ref 13.0–17.0)
Lymphocytes Relative: 33 % (ref 12–46)
Lymphs Abs: 1.7 10*3/uL (ref 0.7–4.0)
MCH: 32.2 pg (ref 26.0–34.0)
MCHC: 33.8 g/dL (ref 30.0–36.0)
MCV: 95.2 fL (ref 78.0–100.0)
MPV: 10.7 fL (ref 8.6–12.4)
Monocytes Absolute: 0.7 10*3/uL (ref 0.1–1.0)
Monocytes Relative: 13 % — ABNORMAL HIGH (ref 3–12)
NEUTROS ABS: 2.6 10*3/uL (ref 1.7–7.7)
NEUTROS PCT: 51 % (ref 43–77)
Platelets: 175 10*3/uL (ref 150–400)
RBC: 4.63 MIL/uL (ref 4.22–5.81)
RDW: 13.9 % (ref 11.5–15.5)
WBC: 5.1 10*3/uL (ref 4.0–10.5)

## 2015-11-28 LAB — LIPID PANEL
CHOL/HDL RATIO: 2.7 ratio (ref <=5.0)
CHOLESTEROL: 113 mg/dL — AB (ref 125–200)
HDL: 42 mg/dL (ref >=40)
LDL Cholesterol: 43 mg/dL (ref <130)
TRIGLYCERIDES: 138 mg/dL (ref <150)
VLDL: 28 mg/dL (ref <30)

## 2015-11-28 LAB — TSH: TSH: 2.183 u[IU]/mL (ref 0.350–4.500)

## 2015-11-29 ENCOUNTER — Encounter: Payer: Self-pay | Admitting: Family Medicine

## 2015-11-29 ENCOUNTER — Ambulatory Visit (INDEPENDENT_AMBULATORY_CARE_PROVIDER_SITE_OTHER): Payer: PPO | Admitting: Family Medicine

## 2015-11-29 VITALS — BP 138/68 | HR 64 | Ht 70.5 in | Wt 206.6 lb

## 2015-11-29 DIAGNOSIS — I2510 Atherosclerotic heart disease of native coronary artery without angina pectoris: Secondary | ICD-10-CM

## 2015-11-29 DIAGNOSIS — N5235 Erectile dysfunction following radiation therapy: Secondary | ICD-10-CM

## 2015-11-29 DIAGNOSIS — F411 Generalized anxiety disorder: Secondary | ICD-10-CM

## 2015-11-29 DIAGNOSIS — K219 Gastro-esophageal reflux disease without esophagitis: Secondary | ICD-10-CM

## 2015-11-29 DIAGNOSIS — I10 Essential (primary) hypertension: Secondary | ICD-10-CM | POA: Diagnosis not present

## 2015-11-29 DIAGNOSIS — E7800 Pure hypercholesterolemia, unspecified: Secondary | ICD-10-CM

## 2015-11-29 DIAGNOSIS — Z0000 Encounter for general adult medical examination without abnormal findings: Secondary | ICD-10-CM | POA: Diagnosis not present

## 2015-11-29 DIAGNOSIS — C61 Malignant neoplasm of prostate: Secondary | ICD-10-CM | POA: Diagnosis not present

## 2015-11-29 DIAGNOSIS — I255 Ischemic cardiomyopathy: Secondary | ICD-10-CM

## 2015-11-29 DIAGNOSIS — Z23 Encounter for immunization: Secondary | ICD-10-CM

## 2015-11-29 DIAGNOSIS — I2583 Coronary atherosclerosis due to lipid rich plaque: Secondary | ICD-10-CM

## 2015-11-29 DIAGNOSIS — E559 Vitamin D deficiency, unspecified: Secondary | ICD-10-CM

## 2015-11-29 LAB — POCT URINALYSIS DIPSTICK
BILIRUBIN UA: NEGATIVE
GLUCOSE UA: NEGATIVE
KETONES UA: NEGATIVE
Leukocytes, UA: NEGATIVE
Nitrite, UA: NEGATIVE
Protein, UA: NEGATIVE
RBC UA: NEGATIVE
Urobilinogen, UA: NEGATIVE
pH, UA: 5.5

## 2015-11-29 LAB — HCV RNA QUANT RFLX ULTRA OR GENOTYP

## 2015-11-29 LAB — VITAMIN D 25 HYDROXY (VIT D DEFICIENCY, FRACTURES): Vit D, 25-Hydroxy: 23 ng/mL — ABNORMAL LOW (ref 30–100)

## 2015-11-29 MED ORDER — VITAMIN D (ERGOCALCIFEROL) 1.25 MG (50000 UNIT) PO CAPS: 50000.0000 [IU] | ORAL_CAPSULE | ORAL | Status: DC

## 2015-11-29 MED ORDER — CITALOPRAM HYDROBROMIDE 20 MG PO TABS: 20.0000 mg | ORAL_TABLET | Freq: Every day | ORAL | Status: DC

## 2015-11-29 NOTE — Telephone Encounter (Signed)
It was confirmed and the PSA was taken off before his labs were drawn on 11/28/15

## 2015-11-29 NOTE — Progress Notes (Signed)
Chief Complaint  Patient presents with  . IPPE-Welcome to Medicare    AWV, nonfasting as patient had labs done.     [name] is a 64 y.o. male who presents for Welcome to Medicare Visit and follow-up on chronic medical conditions.  He has the following concerns:  He has noticed gradually worsening hearing loss, bilaterally. Denies ear pain, ringing or other problems.  He has noticed a decline over the last year.  HTN: BP's at home have been running 130's/70's. Denies headaches, no dizziness (rarely, if stands too quickly), no edema. Hasn't had any chest pain since the last stent was placed over a year ago. No dyspnea on exertion.  CAD: He had another DES placed 05/23/14 to LAD. He completed cardiac rehab and Plavix x 1 year, stopped in June. He continues to see Dr. Marlou Porch, without any new changes in medication.    Kidney stone: He had one in September 2015, took Flomax and passed the stone. No further problems with abdominal pain, or any urinary complaints.  GERD: His Nexium was changed to Protonix last year (due to interaction with Plavix). Initially he had some heartburn after the change, but it has resolved. He is now off the Plavix, but the Protonix seems to work well, so not interested in changing back to Nexium. Denies dysphagia.  He has recurrent symptoms if he misses the medication for 2 days.  Hyperlipidemia: Patient is reportedly following a low-fat, low cholesterol diet. Compliant with medications and denies medication side effects  Prostate cancer: Diagnosed 2008, treated with brachytherapy. Seen yearly by Dr. Karsten Ro, last seen earlier this week. PSA was 0.04.  ED: currently using daily cialis, only partly effective.  Levitra worked better, but not covered by his insurance.  Anxiety: Doing very well. He decreased the citalopram dose to 20mg  about 6 months ago. He had further decreased to 20mg  every other day, but felt some recurrent symptoms so went back up  to 20mg  daily, and is feeling fine.  He hasn't noticed any recurrence in anxiety.  His daughter was just recently diagnosed with lymphoma, and will be admitted to [facility] this weekend for surgery/evaluation.  Vitamin D deficiency: This was noted on labs at his visit last year.  He apparently didn't see MyChart info/results or fill the rx that was sent to his pharmacy last year.  He does not take a multivitamin or Vitamin D supplement.  Immunization History  Administered Date(s) Administered  . Influenza Split 11/03/2011, 10/29/2012  . Influenza,inj,Quad PF,36+ Mos 10/26/2013, 08/30/2014  . Influenza-Unspecified 10/16/2015  . Td 11/26/2005  . Tdap 11/03/2011  . Zoster 02/28/2014   Last colonoscopy: 2 weeks ago, +polyps x4, tubular adenomas; due to recheck in 3 years. Last PSA: recently at urologist, 0.04 Ophtho:2012-13, got new glasses 2-3 years ago Dentist: twice yearly  Exercise: getting 10,000 steps most days, about 60K/week. This is related to being active at work.  Not getting as regular exercise as in the past (no longer getting cardiac rehab)   Other doctors caring for patient include: Urologist: Dr. Karsten Ro Dentist: [facility]. M[facility] Cardiologist: Dr. Marlou Porch GI: Dr. Hilarie Fredrickson Ophtho: [facility] (can't recall name)   Depression screen:  Negative (see epic screen). No falls in the last year Functional status screen notable only for decreased hearing (see epic screen).  End of Life Discussion:  Patient has a living will and medical power of attorney  Past Medical History  Diagnosis Date  . Hyperlipidemia   . ED (erectile  dysfunction)   . Irregular heartbeat     started on beta blocker by Dr. Marlou Porch, improved  . Shingles 2010  . Kidney stone 03/2010    08/2014-distal R ureter  . Prostate cancer ([place]) 01/2008     s/p brachiotherapy; Dr. Karsten Ro  . GERD (gastroesophageal reflux disease)   . CAD (coronary artery disease) 2010, 2015 stents    a. s/p  DES to LAD 06/2009. b. LHC (05/23/14): LAD proximal to previously placed stent  30%, mid LAD distal to the stent 99%, CFX and RCA No CAD EF 50% with ant HK. PCI:  18 x 2.5 mm diameter Xience Alpine DES to mid LAD.  Marland Kitchen Hypertension   . Inguinal hernia, left 08/2014    seen on CT (containing peritoneal fat)  . Myocardial infarction ([place])     2010  . Internal hemorrhoids 10/2015    noted on colonoscopy  . Diverticulosis 10/2015    noted on colonoscopy  . Colon polyps 10/2015    tubular adeomas x 4    Past Surgical History  Procedure Laterality Date  . Colonoscopy  2005, 10/2015  . Vasectomy    . Radioactive seed implant  01/2008    prostate cancer  . Coronary angioplasty with stent placement  06/2009    "1"  . Cardiac catheterization  05/23/2014  . Inguinal hernia repair Left 10/1998  . Left heart catheterization with coronary angiogram N/A 05/23/2014    Procedure: LEFT HEART CATHETERIZATION WITH CORONARY ANGIOGRAM;  Surgeon: Candee Furbish, MD;  Location: [facility] CATH LAB;  Service: Cardiovascular;  Laterality: N/A;  . Percutaneous coronary stent intervention (pci-s) N/A 05/24/2014    Procedure: PERCUTANEOUS CORONARY STENT INTERVENTION (PCI-S);  Surgeon: Sinclair Grooms, MD;  Location: [facility] CATH LAB;  Service: Cardiovascular;  Laterality: N/A;    Social History   Social History  . Marital Status: Married    Spouse Name: N/A  . Number of Children: 3  . Years of Education: N/A   Occupational History  . manufacturing    Social History Main Topics  . Smoking status: Former Smoker    Types: Cigarettes, Pipe, Cigars    Quit date: 12/29/1976  . Smokeless tobacco: Never Used     Comment: distant tobacco h/o in college (2-3 packs/week); +pipe/cigar use occ  x 15 years; quit  in the 1990's  . Alcohol Use: 3.0 oz/week    5 Cans of beer per week     Comment: 3-4 beers per week  . Drug Use: No  . Sexual Activity:    Partners: Female   Other Topics Concern  . Not on file   Social History  Narrative   Married. 2 daughters in [place],  5 grandchildren (locally) and one in [place].   Working part-time, has a Programmer, systems).  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 Heart disease Father 67  . Prostate cancer Father 8  . Hypertension Father   . Hyperlipidemia Father   . Bladder Cancer Mother   . Eating disorder Mother   . Hodgkin's lymphoma Daughter   . Cardiomyopathy Daughter     related to treatment for lymphoma  . Thyroid cancer Daughter   . Heart disease Daughter     congenital ASD, s/p repair  . Cancer Daughter     Hodkin's; thyroid CA; recurrent lymphoma 10/2015  . Prostate cancer Paternal Grandfather   . Diabetes Neg Hx   .  Colon cancer Neg Hx   . Colon polyps Neg Hx   . Esophageal cancer Neg Hx   . Rectal cancer Neg Hx   . Stomach cancer Neg Hx   . Heart disease Brother     arrhythmia    Outpatient Encounter Prescriptions as of 11/29/2015  Medication Sig Note  . aspirin 81 MG tablet Take 81 mg by mouth daily.     Marland Kitchen atorvastatin (LIPITOR) 40 MG tablet Take 1 tablet (40 mg total) by mouth daily.   . carvedilol (COREG) 3.125 MG tablet TAKE 1 TABLET (3.125 MG TOTAL) BY MOUTH 2 (TWO) TIMES DAILY.   . citalopram (CELEXA) 40 MG tablet Take 1 tablet (40 mg total) by mouth daily. (Patient taking differently: Take 20 mg by mouth daily. ) 11/29/2015: Decreased to 20mg  in June  . diphenhydrAMINE (BENADRYL) 25 MG tablet Take 50 mg by mouth every evening.   . fish oil-omega-3 fatty acids 1000 MG capsule Take 1 g by mouth daily.     . pantoprazole (PROTONIX) 40 MG tablet Take 1 tablet (40 mg total) by mouth daily.   . tadalafil (CIALIS) 5 MG tablet Take 5 mg by mouth daily.   Marland Kitchen triamcinolone cream (KENALOG) 0.1 % Apply sparingly to affected areas of skin twice daily for up to 2 weeks. 11/29/2015: Uses prn for eczema, mainly on legs   No facility-administered encounter medications on file as of 11/29/2015.     Allergies  Allergen Reactions  . Imdur [Isosorbide Mononitrate] Rash    ROS: The patient denies anorexia, fever. No vision loss, ear pain, hoarseness, chest pain, palpitations, dizziness, syncope, dyspnea on exertion, cough, swelling, nausea, vomiting, diarrhea, constipation, abdominal pain, melena, hematochezia, indigestion/heartburn, hematuria, incontinence, nocturia, weakened urine stream, dysuria, genital lesions, joint pains, numbness, tingling, weakness, tremor, suspicious skin lesions, depression, anxiety, abnormal bleeding/bruising, or enlarged lymph nodes.  +erectile dysfunction. +Decrease in hearing, which has worsened over the last year. Occasional heel pain. 10# weight gain over the last year   PHYSICAL EXAM:  BP 138/68 mmHg  Pulse 64  Ht 5' 10.5" (1.791 m)  Wt 206 lb 9.6 oz (93.713 kg)  BMI 29.22 kg/m2  General Appearance:  Alert, cooperative, no distress, appears stated age.  Head:  Normocephalic, without obvious abnormality, atraumatic   Eyes:  PERRL, conjunctiva/corneas clear, EOM's intact, fundi  benign   Ears:  Normal TM's. Nonocclusive bilaterally  Nose:  Nares normal, mucosa, no erythema or purulence. Sinuses nontender  Throat:  Lips, mucosa, and tongue normal; teeth and gums normal   Neck:  Supple, no lymphadenopathy; thyroid: no enlargement/tenderness/nodules; no carotid  bruit or JVD   Back:  Spine nontender, no curvature, ROM normal, no CVA tenderness   Lungs:  Clear to auscultation bilaterally without wheezes, rales or ronchi; respirations unlabored   Chest Wall:  No tenderness or deformity   Heart:  Regular rate and rhythm, S1 and S2 normal, no murmur, rub  or gallop.   Breast Exam:  No chest wall tenderness, masses or gynecomastia   Abdomen:  Soft, non-tender, nondistended, normoactive bowel sounds,  no masses, no hepatosplenomegaly   Genitalia:  Deferred to Urologist.  Rectal:   Deferred to Urologist.   Extremities:  No clubbing, cyanosis or edema.   Pulses:  2+ and symmetric all extremities   Skin:  Skin color, texture, turgor normal.  Lymph nodes:  Cervical, supraclavicular, and axillary nodes normal   Neurologic:  CNII-XII intact, normal strength, sensation and gait; reflexes 2+ and symmetric throughout  Psych:  Normal mood, affect, hygiene and grooming       Lab Results  Component Value Date   WBC 5.1 11/28/2015   HGB 14.9 11/28/2015   HCT 44.1 11/28/2015   MCV 95.2 11/28/2015   PLT 175 11/28/2015   Lab Results  Component Value Date   CHOL 113* 11/28/2015   HDL 42 11/28/2015   LDLCALC 43 11/28/2015   TRIG 138 11/28/2015   CHOLHDL 2.7 11/28/2015   Vitamin D-OH 23  Lab Results  Component Value Date   TSH 2.183 11/28/2015     Chemistry      Component Value Date/Time   NA 140 11/28/2015 0001   K 3.9 11/28/2015 0001   CL 105 11/28/2015 0001   CO2 26 11/28/2015 0001   BUN 16 11/28/2015 0001   CREATININE 1.00 11/28/2015 0001   CREATININE 1.02 05/25/2014 0332      Component Value Date/Time   CALCIUM 8.9 11/28/2015 0001   ALKPHOS 52 11/28/2015 0001   AST 23 11/28/2015 0001   ALT 24 11/28/2015 0001   BILITOT 0.8 11/28/2015 0001     Fasting glucose 83  ASSESSMENT/PLAN:  Welcome to Medicare preventive visit - Plan: POCT Urinalysis Dipstick, Visual acuity screening  Immunization due - Plan: Pneumococcal conjugate vaccine 13-valent  Prostate cancer ([place]) - doing well  Pure hypercholesterolemia - lipids at goal  Coronary artery disease due to lipid rich plaque - stable, asymptomatic. continue current meds and asa. f/u with Dr. Marlou Porch per usual. Labs will be forwarded  Essential hypertension - controlled  Vitamin D deficiency - rx weekly x 12 weeks, then discussed need for longterm supplementation with 1000 IU daily - Plan: Vitamin D, Ergocalciferol, (DRISDOL) 50000 UNITS CAPS  capsule  Anxiety state - change to 20mg  tab to avoid cutting. consider cutting to 10mg  if/when stressors less (currently holidays, daughter's illness). ok to cont 20mg  longterm if neede - Plan: citalopram (CELEXA) 20 MG tablet  Gastroesophageal reflux disease without esophagitis - controlled on current regimen  Ischemic cardiomyopathy - stable  Atherosclerosis of native coronary artery of native heart without angina pectoris  Erectile dysfunction after prostate brachytherapy - Levitra was more effective than the Cialis. Encouraged him to check into trying to get prior auth through his urologist   Recommended at least 30 minutes of aerobic activity at least 5 days/week, weight-bearing exercise 2x/wk; proper sunscreen use reviewed; healthy diet and alcohol recommendations (less than or equal to 2 drinks/day) reviewed; regular seatbelt use; changing batteries in smoke detectors. Self-testicular exams. Immunization recommendations discussed--prevnar-13 today; pneumovax next year; continue flu shots yearly. Colonoscopy recommendations reviewed--due again 10/2018.  Pt advised to schedule ophtho, as well as hearing eval. There is some non-occlusive cerumen, that periodically could shift and affect hearing.  Discussed use of OTC drops for wax, consider coming for lavage prior to hearing eval.  Labs forwarded to Dr Marlou Porch.  Discussed Living Will, healthcare POA--he will get [name] copies. He is full code/full care.  MOST form was completed.   Medicare Attestation I have personally reviewed: The patient's medical and social history Their use of alcohol, tobacco or illicit drugs Their current medications and supplements The patient's functional ability including ADLs,fall risks, home safety risks, cognitive, and hearing and visual impairment Diet and physical activities Evidence for depression or mood disorders  The patient's weight, height, BMI, and visual acuity have been recorded in the chart.   I have made referrals, counseling, and provided education to the patient based on review of the above  and I have provided the patient with a written personalized care plan for preventive services.     Lowry Bala A, MD   11/29/2015

## 2015-11-29 NOTE — Patient Instructions (Addendum)
  HEALTH MAINTENANCE RECOMMENDATIONS:  It is recommended that you get at least 30 minutes of aerobic exercise at least 5 days/week (for weight loss, you may need as much as 60-90 minutes). This can be any activity that gets your heart rate up. This can be divided in 10-15 minute intervals if needed, but try and build up your endurance at least once a week.  Weight bearing exercise is also recommended twice weekly.  Eat a healthy diet with lots of vegetables, fruits and fiber.  "Colorful" foods have a lot of vitamins (ie green vegetables, tomatoes, red peppers, etc).  Limit sweet tea, regular sodas and alcoholic beverages, all of which has a lot of calories and sugar.  Up to 2 alcoholic drinks daily may be beneficial for men (unless trying to lose weight, watch sugars).  Drink a lot of water.  Sunscreen of at least SPF 30 should be used on all sun-exposed parts of the skin when outside between the hours of 10 am and 4 pm (not just when at beach or pool, but even with exercise, golf, tennis, and yard work!)  Use a sunscreen that says "broad spectrum" so it covers both UVA and UVB rays, and make sure to reapply every 1-2 hours.  Remember to change the batteries in your smoke detectors when changing your clock times in the spring and fall.  Use your seat belt every time you are in a car, and please drive safely and not be distracted with cell phones and texting while driving.  Your vitamin D is very low. This can contribute to fatigue. I recommend prescription vitamin D once weekly for 12 weeks. Once you finish this, you should start 1000 IU once daily (over-the-counter vitamin D).        Mr. Flaum , Thank you for taking time to come for your Welcome to Medicare Physical. I appreciate your ongoing commitment to your health goals. Please review the following plan we discussed and let me know if I can assist you in the future.   These are the goals we discussed: Goals    None      This is  a list of the screening recommended for you and due dates:  Health Maintenance  Topic Date Due  .  Hepatitis C: One time screening is recommended by Center for Disease Control  (CDC) for  adults born from [birth year] through 1966.   [birth year]  . HIV Screening  09/13/1966  . Pneumonia vaccines (1 of 2 - PCV13) 09/14/2015  . Flu Shot  07/29/2016  . Colon Cancer Screening  11/14/2018  . Tetanus Vaccine  11/02/2021  . Shingles Vaccine  Completed   We are adding the hepatitis C screen to the bloodwork done yesterday. You may have already been screened elsewhere for HIV. If you desire additional testing, let us know.  You are low risk. We are giving the first pneumonia shot today, and the second next year.  Please schedule your eye exam.

## 2015-11-30 DIAGNOSIS — N5235 Erectile dysfunction following radiation therapy: Secondary | ICD-10-CM | POA: Insufficient documentation

## 2015-11-30 LAB — HEPATITIS C ANTIBODY

## 2016-01-10 DIAGNOSIS — H52203 Unspecified astigmatism, bilateral: Secondary | ICD-10-CM | POA: Diagnosis not present

## 2016-01-10 DIAGNOSIS — H5213 Myopia, bilateral: Secondary | ICD-10-CM | POA: Diagnosis not present

## 2016-02-07 ENCOUNTER — Other Ambulatory Visit: Payer: Self-pay | Admitting: Cardiology

## 2016-02-09 ENCOUNTER — Other Ambulatory Visit: Payer: Self-pay | Admitting: Cardiology

## 2016-03-04 DIAGNOSIS — M549 Dorsalgia, unspecified: Secondary | ICD-10-CM | POA: Diagnosis not present

## 2016-03-04 DIAGNOSIS — Z0000 Encounter for general adult medical examination without abnormal findings: Secondary | ICD-10-CM | POA: Diagnosis not present

## 2016-03-10 ENCOUNTER — Other Ambulatory Visit: Payer: Self-pay | Admitting: Cardiology

## 2016-03-27 ENCOUNTER — Other Ambulatory Visit: Payer: Self-pay | Admitting: Cardiology

## 2016-04-16 ENCOUNTER — Other Ambulatory Visit: Payer: Self-pay | Admitting: Cardiology

## 2016-04-16 NOTE — Telephone Encounter (Signed)
Rx request sent to pharmacy.  

## 2016-04-28 ENCOUNTER — Ambulatory Visit: Payer: PPO | Admitting: Cardiology

## 2016-04-29 ENCOUNTER — Ambulatory Visit (INDEPENDENT_AMBULATORY_CARE_PROVIDER_SITE_OTHER): Payer: PPO | Admitting: Cardiology

## 2016-04-29 ENCOUNTER — Encounter: Payer: Self-pay | Admitting: Cardiology

## 2016-04-29 VITALS — BP 136/82 | HR 58 | Ht 70.0 in | Wt 208.0 lb

## 2016-04-29 DIAGNOSIS — E7800 Pure hypercholesterolemia, unspecified: Secondary | ICD-10-CM

## 2016-04-29 DIAGNOSIS — I10 Essential (primary) hypertension: Secondary | ICD-10-CM | POA: Diagnosis not present

## 2016-04-29 DIAGNOSIS — I2510 Atherosclerotic heart disease of native coronary artery without angina pectoris: Secondary | ICD-10-CM | POA: Diagnosis not present

## 2016-04-29 DIAGNOSIS — I2583 Coronary atherosclerosis due to lipid rich plaque: Principal | ICD-10-CM

## 2016-04-29 MED ORDER — ATORVASTATIN CALCIUM 40 MG PO TABS
40.0000 mg | ORAL_TABLET | Freq: Every day | ORAL | Status: DC
Start: 2016-04-29 — End: 2017-05-07

## 2016-04-29 NOTE — Progress Notes (Signed)
[name]. [address], [address] [place], [place]  [address] Phone: [phone] Fax:  [phone]  Date:  04/29/2016   ID:  [name], DOB [birth year], MRN [id number]  PCP:  Vikki Ports, MD   History of Present Illness: [name] is a 65 y.o. male with coronary artery disease status post DES to LAD and 06/2009 as well as DES to LAD 05/23/14 distal to previously placed stent with hypertension, hyperlipidemia, anxiety here for followup.  Overall feeling well, occasional have mild chest discomfort with exercise but this is infrequent. He goes to the [facility] 3 days a week and exercises with a target heart rate of about 135 bpm. Doing well. He had a kidney stone that has resolved.   Celexa working well for him. Previously per dissipated in the Cardiac maintenance program. He wishes to be off of medications if possible. Beta blocker, carvedilol 3.125 twice a day was added in September of 2015 as a trial. His blood pressures are better controlled. At home usually 0000000 systolic. overall he is doing well.  Studies:  - LHC (05/23/14): LAD proximal to previously placed stent 30%, mid LAD distal to the stent 99%, CFX and RCA No CAD EF 50% with ant HK. PCI: 18 x 2.5 mm diameter Xience Alpine DES to mid LAD.  - Nuclear (04/26/14): High risk stress nuclear study . There is a small but severe area of ischemia in the distal anterior wall. EF 46%. Ant HK.   Recent Labs:  11/03/2013: ALT 37; HDL Cholesterol by NMR 41; LDL (calc) 51  05/25/2014: Creatinine 1.02; Hemoglobin 14.1; Potassium 4.6       Wt Readings from Last 3 Encounters:  04/29/16 208 lb (94.348 kg)  11/29/15 206 lb 9.6 oz (93.713 kg)  11/15/15 205 lb (92.987 kg)     Past Medical History  Diagnosis Date  . Hyperlipidemia   . ED (erectile dysfunction)   . Irregular heartbeat     started on beta blocker by Dr. Marlou Porch, improved  . Shingles 2010  . Kidney stone 03/2010    08/2014-distal R ureter  . Prostate cancer ([place]) 01/2008      s/p brachiotherapy; Dr. Karsten Ro  . GERD (gastroesophageal reflux disease)   . CAD (coronary artery disease) 2010, 2015 stents    a. s/p DES to LAD 06/2009. b. LHC (05/23/14): LAD proximal to previously placed stent  30%, mid LAD distal to the stent 99%, CFX and RCA No CAD EF 50% with ant HK. PCI:  18 x 2.5 mm diameter Xience Alpine DES to mid LAD.  Marland Kitchen Hypertension   . Inguinal hernia, left 08/2014    seen on CT (containing peritoneal fat)  . Myocardial infarction ([place])     2010  . Internal hemorrhoids 10/2015    noted on colonoscopy  . Diverticulosis 10/2015    noted on colonoscopy  . Colon polyps 10/2015    tubular adeomas x 4    Past Surgical History  Procedure Laterality Date  . Colonoscopy  2005, 10/2015  . Vasectomy    . Radioactive seed implant  01/2008    prostate cancer  . Coronary angioplasty with stent placement  06/2009    "1"  . Cardiac catheterization  05/23/2014  . Inguinal hernia repair Left 10/1998  . Left heart catheterization with coronary angiogram N/A 05/23/2014    Procedure: LEFT HEART CATHETERIZATION WITH CORONARY ANGIOGRAM;  Surgeon: Candee Furbish, MD;  Location: [facility] CATH LAB;  Service: Cardiovascular;  Laterality: N/A;  . Percutaneous coronary stent intervention (pci-s) N/A 05/24/2014    Procedure: PERCUTANEOUS CORONARY STENT INTERVENTION (PCI-S);  Surgeon: Sinclair Grooms, MD;  Location: [facility] CATH LAB;  Service: Cardiovascular;  Laterality: N/A;    Current Outpatient Prescriptions  Medication Sig Dispense Refill  . aspirin 81 MG tablet Take 81 mg by mouth daily.      Marland Kitchen atorvastatin (LIPITOR) 40 MG tablet Take 1 tablet (40 mg total) by mouth daily. 15 tablet 1  . carvedilol (COREG) 3.125 MG tablet TAKE 1 TABLET (3.125 MG TOTAL) BY MOUTH 2 (TWO) TIMES DAILY. 60 tablet 0  . citalopram (CELEXA) 20 MG tablet Take 1 tablet (20 mg total) by mouth daily. 90 tablet 3  . diphenhydrAMINE (BENADRYL) 25 MG tablet Take 50 mg by mouth every evening.    . fish oil-omega-3  fatty acids 1000 MG capsule Take 1 g by mouth daily.      . pantoprazole (PROTONIX) 40 MG tablet Take 1 tablet (40 mg total) by mouth daily. 30 tablet 11  . tadalafil (CIALIS) 5 MG tablet Take 5 mg by mouth daily.    Marland Kitchen triamcinolone cream (KENALOG) 0.1 % Apply sparingly to affected areas of skin twice daily for up to 2 weeks. 80 g 1  . Vitamin D, Ergocalciferol, (DRISDOL) 50000 UNITS CAPS capsule Take 1 capsule (50,000 Units total) by mouth every 7 (seven) days. 12 capsule 0   No current facility-administered medications for this visit.    Allergies:    Allergies  Allergen Reactions  . Imdur [Isosorbide Mononitrate] Rash    Social History:  The patient  reports that he quit smoking about 39 years ago. His smoking use included Cigarettes, Pipe, and Cigars. He has never used smokeless tobacco. He reports that he drinks about 3.0 oz of alcohol per week. He reports that he does not use illicit drugs.   Works at [employer] History  Problem Relation Age of Onset  . Heart disease Father 69  . Prostate cancer Father 31  . Hypertension Father   . Hyperlipidemia Father   . Bladder Cancer Mother   . Eating disorder Mother   . Hodgkin's lymphoma Daughter   . Cardiomyopathy Daughter     related to treatment for lymphoma  . Thyroid cancer Daughter   . Heart disease Daughter     congenital ASD, s/p repair  . Cancer Daughter     Hodkin's; thyroid CA; recurrent lymphoma 10/2015  . Prostate cancer Paternal Grandfather   . Diabetes Neg Hx   . Colon cancer Neg Hx   . Colon polyps Neg Hx   . Esophageal cancer Neg Hx   . Rectal cancer Neg Hx   . Stomach cancer Neg Hx   . Heart disease Brother     arrhythmia    ROS:  Please see the history of present illness. Positive for leg swelling, shortness of breath with activity, blood in urine, rash  Denies any fevers, chills, orthopnea, PND, syncope.   All other systems reviewed and negative.   PHYSICAL EXAM: VS:  BP 136/82 mmHg  Pulse 58  Ht  5\' 10"  (1.778 m)  Wt 208 lb (94.348 kg)  BMI 29.84 kg/m2 Well nourished, well developed, in no acute distress HEENT: normal, [place]/AT, EOMI Neck: no JVD, normal carotid upstroke, no bruit Cardiac:  normal S1, S2; RRR; no murmur Lungs:  clear to auscultation bilaterally, no wheezing, rhonchi or rales Abd: soft, nontender, no hepatomegaly, no bruits Ext: no edema,  2+ distal pulses Skin: warm and dry GU: deferred Neuro: no focal abnormalities noted, AAO x 3  EKG:  EKG was ordered today. 04/29/16-sinus rhythm, 60, PAC, no other abnormalities personally viewed.     ASSESSMENT AND PLAN:  1. Coronary artery disease-2 DES and LAD placed in 2010 and 05/24/2014.  Reviewed. Continue with aggressive secondary prevention. He feels some mild chest discomfort at times periodic during exercise. We will continue to monitor this. This does not appear to be unstable. Last stent was in the mid LAD segment in 2015. If symptoms worsen or become more worrisome, he knows to call us and we will pursue further testing.  2. Hypertension-recently blood pressure has been quite labile. Excellent today. Sometimes in 150's. Previously he was on metoprolol. May have felt some fatigue on this medication although after stopping beta blocker, fatigue is not completely resolved. Ultimately, I am pleased with some of his progress with carvedilol low dose. Heart rate does not tolerate increase dose. If necessary, we can add ACE inhibitor in the future. He will monitor his blood pressures at home. Overall doing well.  3. Hyperlipidemia-continue with atorvastatin 40 mg. LDL goal less than 70. last check 43. Excellent. No myalgias. 4. We will see back in one year   Signed, Candee Furbish, MD [facility]  04/29/2016 8:59 AM

## 2016-04-29 NOTE — Patient Instructions (Signed)

## 2016-05-09 ENCOUNTER — Other Ambulatory Visit: Payer: Self-pay | Admitting: Cardiology

## 2016-05-09 ENCOUNTER — Other Ambulatory Visit: Payer: Self-pay

## 2016-05-09 MED ORDER — CARVEDILOL 3.125 MG PO TABS: ORAL_TABLET | ORAL | Status: DC

## 2016-05-15 ENCOUNTER — Other Ambulatory Visit: Payer: Self-pay

## 2016-05-15 MED ORDER — PANTOPRAZOLE SODIUM 40 MG PO TBEC
40.0000 mg | DELAYED_RELEASE_TABLET | Freq: Every day | ORAL | Status: DC
Start: 2016-05-15 — End: 2017-05-11

## 2016-05-18 ENCOUNTER — Other Ambulatory Visit: Payer: Self-pay | Admitting: Cardiology

## 2016-09-08 ENCOUNTER — Encounter: Payer: Self-pay | Admitting: *Deleted

## 2016-11-25 DIAGNOSIS — Z8546 Personal history of malignant neoplasm of prostate: Secondary | ICD-10-CM | POA: Diagnosis not present

## 2016-11-27 ENCOUNTER — Telehealth: Payer: Self-pay | Admitting: *Deleted

## 2016-11-27 ENCOUNTER — Other Ambulatory Visit: Payer: PPO

## 2016-11-27 DIAGNOSIS — E7800 Pure hypercholesterolemia, unspecified: Secondary | ICD-10-CM

## 2016-11-27 DIAGNOSIS — Z5181 Encounter for therapeutic drug level monitoring: Secondary | ICD-10-CM | POA: Diagnosis not present

## 2016-11-27 DIAGNOSIS — Z1159 Encounter for screening for other viral diseases: Secondary | ICD-10-CM

## 2016-11-27 DIAGNOSIS — E559 Vitamin D deficiency, unspecified: Secondary | ICD-10-CM | POA: Diagnosis not present

## 2016-11-27 LAB — LIPID PANEL
CHOL/HDL RATIO: 3.2 ratio (ref <5.0)
Cholesterol: 122 mg/dL (ref <200)
HDL: 38 mg/dL — AB (ref >40)
LDL CALC: 49 mg/dL (ref <100)
TRIGLYCERIDES: 177 mg/dL — AB (ref <150)
VLDL: 35 mg/dL — AB (ref <30)

## 2016-11-27 LAB — COMPREHENSIVE METABOLIC PANEL
ALT: 29 U/L (ref 9–46)
AST: 26 U/L (ref 10–35)
Albumin: 4 g/dL (ref 3.6–5.1)
Alkaline Phosphatase: 47 U/L (ref 40–115)
BUN: 16 mg/dL (ref 7–25)
CHLORIDE: 105 mmol/L (ref 98–110)
CO2: 27 mmol/L (ref 20–31)
CREATININE: 1.25 mg/dL (ref 0.70–1.25)
Calcium: 9 mg/dL (ref 8.6–10.3)
GLUCOSE: 102 mg/dL — AB (ref 65–99)
POTASSIUM: 4.5 mmol/L (ref 3.5–5.3)
SODIUM: 139 mmol/L (ref 135–146)
TOTAL PROTEIN: 6.4 g/dL (ref 6.1–8.1)
Total Bilirubin: 0.8 mg/dL (ref 0.2–1.2)

## 2016-11-27 LAB — CBC WITH DIFFERENTIAL/PLATELET
BASOS ABS: 0 {cells}/uL (ref 0–200)
BASOS PCT: 0 %
EOS ABS: 177 {cells}/uL (ref 15–500)
Eosinophils Relative: 3 %
HEMATOCRIT: 43.7 % (ref 38.5–50.0)
Hemoglobin: 15.1 g/dL (ref 13.2–17.1)
Lymphocytes Relative: 27 %
Lymphs Abs: 1593 cells/uL (ref 850–3900)
MCH: 32.6 pg (ref 27.0–33.0)
MCHC: 34.6 g/dL (ref 32.0–36.0)
MCV: 94.4 fL (ref 80.0–100.0)
MONO ABS: 649 {cells}/uL (ref 200–950)
MONOS PCT: 11 %
MPV: 10.3 fL (ref 7.5–12.5)
NEUTROS ABS: 3481 {cells}/uL (ref 1500–7800)
Neutrophils Relative %: 59 %
PLATELETS: 181 10*3/uL (ref 140–400)
RBC: 4.63 MIL/uL (ref 4.20–5.80)
RDW: 13.9 % (ref 11.0–15.0)
WBC: 5.9 10*3/uL (ref 4.0–10.5)

## 2016-11-27 NOTE — Telephone Encounter (Signed)
Orders entered (as future orders)--okay to release

## 2016-11-27 NOTE — Telephone Encounter (Signed)
Patient has appt next week and came in for fasting labs this am-need orders please, thanks.

## 2016-11-28 LAB — VITAMIN D 25 HYDROXY (VIT D DEFICIENCY, FRACTURES): Vit D, 25-Hydroxy: 30 ng/mL (ref 30–100)

## 2016-11-28 LAB — HEPATITIS C ANTIBODY: HCV Ab: NEGATIVE

## 2016-12-02 NOTE — Progress Notes (Signed)
Chief Complaint  Patient presents with  . Annual Exam    nonfasting annual exam/AWV/med check. Did not do eye exam as he sees eye doctor and would prefer to do with [place] [facility]. Will do urine specimen on way out, could not go. No concerns today.    [name] is a 65 y.o. male who presents for annual physical exam, Medicare wellness visit and follow-up on chronic medical conditions.  He has the following concerns:  Some jaw pain (mostly left, only occasionally on the right) recently, for which he takes either ibuprofen or Aleve prn for pain.  Taking it about twice a week. He has seen the dentist (discussed when reviewing his labs--slight bump noted in his creatinine).    He has some hearing loss, bilaterally. Denies ear pain, ringing or other problems.  He hasn't had hearing checked yet. Denies any significant worsening over the last year.  Wife states the TV is loud.  HTN: BP's at home have been running 130/80.He reports it was something like 110/60 at the urologist last week.  Denies headaches, no dizziness (only if stands too quickly).  He sees slight swelling in the feet/ankles (sock mark only, not worsening). Hasn't had any chest pain since the last stent was placed. No dyspnea on exertion.  CAD: He had another DES placed 05/23/14 to LAD. He completed cardiac rehab and Plavix x 1 year. He continues to see Dr. Marlou Fuller, without any new changes in medication.  Last seen in May.  (noted that pulse didn't tolerate higher doses of med; may need to add ACEI for BP control if needed).  Kidney stone: He had one in September 2015, took Flomax and passed the stone. He saw urologist in March 2017 with pain, felt to be musculoskeletal. No further problems with abdominal pain, or any urinary complaints.  Prostate cancer: diagnosed 2008, treated with brachytherapy.  Prostate exam was performed at March 2017 visit, also just recently seen in November (notes not yet received) and patient states that  PSA was checked, hasn't gotten the result yet.  Sees Dr. Karsten Fuller yearly.   GERD: His Nexium was changed to Protonix a couple of years ago (due to interaction with Plavix). Initially he had some heartburn after the change, but it has resolved. He is now off the Plavix, but the Protonix seems to work well, so not interested in changing back to Nexium. Denies dysphagia.  He has recurrent symptoms if he misses the medication for 2 days.  Hyperlipidemia: Patient is reportedly following a low-fat, low cholesterol diet. Compliant with medications and denies medication side effects. He admits that his diet hasn't been as good, eating more sugar, cookies. He has gained about 5# over the last year.  ED: currently using Levitra with good results.  This works better than the Cialis.  Anxiety: Doing very well. He decreased the citalopram dose to 20mg  last year, had some recurrent symptoms when titrated to qod. In the last 5 months he has started to titrate the dose down again, taking 1/2 tablet alternating with 1 tablet (10/20 alternating).  Doing well on this regimen.   His daughter has mesothelioma.  She and her 2 kids and husband are currently living with them. She is on oxygen now.  Vitamin D deficiency: He was treated last year with 12 weeks of 50,000 IU weekly.  He currently is taking 1000 IU daily.   Immunization History  Administered Date(s) Administered  . Influenza Split 11/03/2011, 10/29/2012  . Influenza, High Dose  Seasonal PF 09/03/2016  . Influenza,inj,Quad PF,36+ Mos 10/26/2013, 08/30/2014  . Influenza-Unspecified 10/16/2015  . Pneumococcal Conjugate-13 11/29/2015  . Td 11/26/2005  . Tdap 11/03/2011  . Zoster 02/28/2014   Last colonoscopy: 10/2015-- +polyps x4, tubular adenomas; due to recheck in 3 years. Last PSA: just done (10/2016) at Dr. Simone Fuller office (no results given yet) Ophtho: last year Dentist: twice yearly  Exercise: [employer] 3 days/week--treadmill, weights,  stair stepper. Gets 7500 steps daily.  Walks his daughter's dog daily.   Other doctors caring for patient include: Urologist: Dr. Karsten Fuller Dentist: Dr. Mina [facility] at [facility] Cardiologist: Dr. Marlou Fuller GI: Dr. Hilarie Fuller Ophtho: Dr. Claudean Fuller   Depression screen:  Negative (see epic screen). No falls in the last year Functional status screen notable only for decreased hearing, leakage of urine (see epic screen). He notices leakage if he has to go badly, but isn't able to (urge incontinence).   End of Life Discussion:  Patient has a living will and medical power of attorney  Past Medical History:  Diagnosis Date  . CAD (coronary artery disease) 2010, 2015 stents   a. s/p DES to LAD 06/2009. b. LHC (05/23/14): LAD proximal to previously placed stent  30%, mid LAD distal to the stent 99%, CFX and RCA No CAD EF 50% with ant HK. PCI:  18 x 2.5 mm diameter Xience Alpine DES to mid LAD.  Marland Kitchen Colon polyps 10/2015   tubular adeomas x 4  . Diverticulosis 10/2015   noted on colonoscopy  . ED (erectile dysfunction)   . GERD (gastroesophageal reflux disease)   . Hyperlipidemia   . Hypertension   . Inguinal hernia, left 08/2014   seen on CT (containing peritoneal fat)  . Internal hemorrhoids 10/2015   noted on colonoscopy  . Irregular heartbeat    started on beta blocker by Dr. Marlou Fuller, improved  . Kidney stone 03/2010   08/2014-distal R ureter  . Myocardial infarction    2010  . Prostate cancer ([place]) 01/2008    s/p brachiotherapy; Dr. Karsten Fuller  . Shingles 2010    Past Surgical History:  Procedure Laterality Date  . CARDIAC CATHETERIZATION  05/23/2014  . COLONOSCOPY  2005, 10/2015  . CORONARY ANGIOPLASTY WITH STENT PLACEMENT  06/2009   "1"  . INGUINAL HERNIA REPAIR Left 10/1998  . LEFT HEART CATHETERIZATION WITH CORONARY ANGIOGRAM N/A 05/23/2014   Procedure: LEFT HEART CATHETERIZATION WITH CORONARY ANGIOGRAM;  Surgeon: Allen Furbish, MD;  Location: [facility] CATH LAB;  Service: Cardiovascular;   Laterality: N/A;  . PERCUTANEOUS CORONARY STENT INTERVENTION (PCI-S) N/A 05/24/2014   Procedure: PERCUTANEOUS CORONARY STENT INTERVENTION (PCI-S);  Surgeon: Sinclair Grooms, MD;  Location: [facility] CATH LAB;  Service: Cardiovascular;  Laterality: N/A;  . RADIOACTIVE SEED IMPLANT  01/2008   prostate cancer  . VASECTOMY      Social History   Social History  . Marital status: Married    Spouse name: N/A  . Number of children: 3  . Years of education: N/A   Occupational History  . [employer]   Social History Main Topics  . Smoking status: Former Smoker    Types: Cigarettes, Pipe, Cigars    Quit date: 12/29/1976  . Smokeless tobacco: Never Used     Comment: distant tobacco h/o in college (2-3 packs/week); +pipe/cigar use occ  x 15 years; quit  in the 1990's  . Alcohol use 3.0 oz/week    5 Cans of beer per week     Comment: 5 beers per  week  . Drug use: No  . Sexual activity: Yes    Partners: Female   Other Topics Concern  . Not on file   Social History Narrative   Married. 2 daughters in [place],  5 grandchildren (locally) and one in [place].   Working part-time, has a Programmer, systems).  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 Heart disease Father 27  . Prostate cancer Father 65  . Hypertension Father   . Hyperlipidemia Father   . Bladder Cancer Mother   . Eating disorder Mother   . Hodgkin's lymphoma Daughter   . Cardiomyopathy Daughter     related to treatment for lymphoma  . Thyroid cancer Daughter   . Heart disease Daughter     congenital ASD, s/p repair  . Cancer Daughter     Hodkin's; thyroid CA; recurrent lymphoma 10/2015  . Heart disease Brother     arrhythmia  . Prostate cancer Paternal Grandfather   . Diabetes Neg Hx   . Colon cancer Neg Hx   . Colon polyps Neg Hx   . Esophageal cancer Neg Hx   . Rectal cancer Neg Hx   . Stomach cancer Neg Hx     Outpatient  Encounter Prescriptions as of 12/03/2016  Medication Sig Note  . aspirin 81 MG tablet Take 81 mg by mouth daily.     Marland Kitchen atorvastatin (LIPITOR) 40 MG tablet Take 1 tablet (40 mg total) by mouth daily.   . carvedilol (COREG) 3.125 MG tablet TAKE 1 TABLET (3.125 MG TOTAL) BY MOUTH 2 (TWO) TIMES DAILY.   . cholecalciferol (VITAMIN D) 1000 units tablet Take 1,000 Units by mouth daily.   . citalopram (CELEXA) 20 MG tablet Take 1 tablet (20 mg total) by mouth daily.   . diphenhydrAMINE (BENADRYL) 25 MG tablet Take 50 mg by mouth every evening.   . fish oil-omega-3 fatty acids 1000 MG capsule Take 1 g by mouth daily.     Marland Kitchen LEVITRA 20 MG tablet TAKE 1 TABLET BY MOUTH AS NEEDED *PRIOR AUTH* 12/03/2016: Received from: External Pharmacy  . pantoprazole (PROTONIX) 40 MG tablet Take 1 tablet (40 mg total) by mouth daily.   Marland Kitchen triamcinolone cream (KENALOG) 0.1 % Apply sparingly to affected areas of skin twice daily for up to 2 weeks.   . [DISCONTINUED] tadalafil (CIALIS) 5 MG tablet Take 5 mg by mouth daily.   . [DISCONTINUED] Vitamin D, Ergocalciferol, (DRISDOL) 50000 UNITS CAPS capsule Take 1 capsule (50,000 Units total) by mouth every 7 (seven) days.    No facility-administered encounter medications on file as of 12/03/2016.     Allergies  Allergen Reactions  . Imdur [Isosorbide Mononitrate] Rash    ROS: The patient denies anorexia, fever. No vision loss, ear pain, hoarseness, chest pain, palpitations, syncope, dyspnea on exertion, cough, swelling, nausea, vomiting, diarrhea, constipation, abdominal pain, melena, hematochezia, nocturia, weakened urine stream, dysuria, genital lesions, joint pains, numbness, tingling, weakness, tremor, suspicious skin lesions, depression, anxiety, abnormal bleeding/bruising, or enlarged lymph nodes.  +erectile dysfunction, urge incontinence (mild) as per HPI. Blood in the urine once or twice in the last year (discussed with Dr. Karsten Fuller). +Decrease in hearing. Heartburn  only if misses his medication 2+ days.    PHYSICAL EXAM:  BP (!) 152/90 (BP Location: Left Arm, Patient Position: Sitting, Cuff Size: Normal)   Pulse 60   Ht 5\' 10"  (1.778 m)   Wt 211 lb (95.7  kg)   BMI 30.28 kg/m   148/88 on repeat by MD, RA  General Appearance:  Alert, cooperative, no distress, appears stated age.  Head:  Normocephalic, without obvious abnormality, atraumatic   Eyes:  PERRL, conjunctiva/corneas clear, EOM's intact, fundi  benign   Ears:  Normal TM's and EAC's  Nose:  Nares normal, mucosa, no erythema or purulence. Sinuses nontender  Throat:  Lips, mucosa, and tongue normal; teeth and gums normal   Neck:  Supple, no lymphadenopathy; thyroid: no enlargement/tenderness/nodules; no carotid bruit or JVD   Back:  Spine nontender, no curvature, ROM normal, no CVA tenderness   Lungs:  Clear to auscultation bilaterally without wheezes, rales or ronchi; respirations unlabored   Chest Wall:  No tenderness or deformity   Heart:  Regular rate and rhythm, S1 and S2 normal, no murmur, rub or gallop.   Breast Exam:  No chest wall tenderness, masses or gynecomastia   Abdomen:  Soft, non-tender, nondistended, normoactive bowel sounds,  no masses, no hepatosplenomegaly   Genitalia:  circumsized penis without lesions. Testicles without lesions. No herniapalpable (small LIH containing peritoneal fat noted on CT in 2015).  Rectal:  Deferred to Urologist.   Extremities:  No clubbing, cyanosis or edema.   Pulses:  2+ and symmetric all extremities   Skin:  Skin color, texture, turgor normal.  Lymph nodes:  Cervical, supraclavicular, and axillary nodes normal   Neurologic:  CNII-XII intact, normal strength, sensation and gait; reflexes 2+ and symmetric throughout   Psych:   Normal mood, affect, hygiene and grooming    Chemistry      Component Value Date/Time   NA 139 11/27/2016  0839   K 4.5 11/27/2016 0839   CL 105 11/27/2016 0839   CO2 27 11/27/2016 0839   BUN 16 11/27/2016 0839   CREATININE 1.25 11/27/2016 0839      Component Value Date/Time   CALCIUM 9.0 11/27/2016 0839   ALKPHOS 47 11/27/2016 0839   AST 26 11/27/2016 0839   ALT 29 11/27/2016 0839   BILITOT 0.8 11/27/2016 0839     Fasting glucose 102 Vitamin D-OH 30  Lab Results  Component Value Date   CHOL 122 11/27/2016   HDL 38 (L) 11/27/2016   LDLCALC 49 11/27/2016   TRIG 177 (H) 11/27/2016   CHOLHDL 3.2 11/27/2016   Lab Results  Component Value Date   WBC 5.9 11/27/2016   HGB 15.1 11/27/2016   HCT 43.7 11/27/2016   MCV 94.4 11/27/2016   PLT 181 11/27/2016   Hepatitis C Ab negative   ASSESSMENT/PLAN:  Annual physical exam - Plan: POCT Urinalysis Dipstick  Medicare annual wellness visit, initial  Prostate cancer [facility]) - await notes from Dr. Karsten Fuller. pt will call to get PSA results  Essential hypertension - elevated today, normal at home, per pt. Continue monitoring, and if persistently elevated, f/u with Dr. Marlou Fuller  Anxiety state - controlled; given stressors (daughter), I don't recommend further tapering - Plan: citalopram (CELEXA) 20 MG tablet  Vitamin D deficiency - advised to increase to 2000 IU daily  Coronary artery disease due to lipid rich plaque - asymptomatic; continue current regimen  Pure hypercholesterolemia - LDL at goal; TG elevated due to dietary changes. Diet reviewed  Gastroesophageal reflux disease without esophagitis - continue Protonix. Weight loss encouraged  Erectile dysfunction after prostate brachytherapy  Immunization due - Plan: Pneumococcal polysaccharide vaccine 23-valent greater than or equal to 2yo subcutaneous/IM  Anxiety state - Plan: citalopram (CELEXA) 20 MG tablet  Obesity with  body mass index greater than 30 - discussed diet, weight loss; fasting glucose slightly elevated. Weight loss encouraged  Abdominal aortic atherosclerosis  (HCC) - mild.  Continue current meds   Recommended at least 30 minutes of aerobic activity at least 5 days/week, weight-bearing exercise 2x/wk; proper sunscreen use reviewed; healthy diet and alcohol recommendations (less than or equal to 2 drinks/day) reviewed; regular seatbelt use; changing batteries in smoke detectors. Self-testicular exams. Immunization recommendations discussed--pneumovax today; continue flu shots yearly.Consider Shingrix (if covered). Colonoscopy recommendations reviewed--due again 10/2018.    Consider getting the new Shingrix (shingles vaccine) when available. Check with your insurance to see if it is covered, and if you can get at the office vs the pharmacy (pharmacy if covered by part D Medicare).  Please cut back on the sweets/sugar in the diet.  Your triglycerides were slightly up, as well as your fasting sugar and your weight. Make sure to get at least 150 minutes of aerobic exercise each week. Don't rush to come down any further on the citalopram, and feel free to take 20mg  every day if that would be just as easy. Continue to monitor BP, and let Dr. Marlou Fuller know if it is truly running >135-140/85-90 (it was up here today).   Labs forwarded to Dr [name].  Discussed Living Will, healthcare POA--he will get [name] copies, new forms given, in case needed. He is full code/full care.  MOST form was reviewed (unchanged, signed)  Medicare Attestation I have personally reviewed: The patient's medical and social history Their use of alcohol, tobacco or illicit drugs Their current medications and supplements The patient's functional ability including ADLs,fall risks, home safety risks, cognitive, and hearing and visual impairment Diet and physical activities Evidence for depression or mood disorders  The patient's weight, height, and BMI have been recorded in the chart.  I have made referrals, counseling, and provided education to the patient based on review of the above  and I have provided the patient with a written personalized care plan for preventive services.     Keiji Melland A, MD   12/02/2016

## 2016-12-03 ENCOUNTER — Ambulatory Visit (INDEPENDENT_AMBULATORY_CARE_PROVIDER_SITE_OTHER): Payer: PPO | Admitting: Family Medicine

## 2016-12-03 ENCOUNTER — Encounter: Payer: Self-pay | Admitting: Family Medicine

## 2016-12-03 VITALS — BP 148/88 | HR 60 | Ht 70.0 in | Wt 211.0 lb

## 2016-12-03 DIAGNOSIS — E559 Vitamin D deficiency, unspecified: Secondary | ICD-10-CM

## 2016-12-03 DIAGNOSIS — Z0000 Encounter for general adult medical examination without abnormal findings: Secondary | ICD-10-CM | POA: Diagnosis not present

## 2016-12-03 DIAGNOSIS — E7800 Pure hypercholesterolemia, unspecified: Secondary | ICD-10-CM

## 2016-12-03 DIAGNOSIS — I700 Atherosclerosis of aorta: Secondary | ICD-10-CM | POA: Diagnosis not present

## 2016-12-03 DIAGNOSIS — N5235 Erectile dysfunction following radiation therapy: Secondary | ICD-10-CM

## 2016-12-03 DIAGNOSIS — K219 Gastro-esophageal reflux disease without esophagitis: Secondary | ICD-10-CM

## 2016-12-03 DIAGNOSIS — I10 Essential (primary) hypertension: Secondary | ICD-10-CM | POA: Diagnosis not present

## 2016-12-03 DIAGNOSIS — I2510 Atherosclerotic heart disease of native coronary artery without angina pectoris: Secondary | ICD-10-CM

## 2016-12-03 DIAGNOSIS — C61 Malignant neoplasm of prostate: Secondary | ICD-10-CM

## 2016-12-03 DIAGNOSIS — E669 Obesity, unspecified: Secondary | ICD-10-CM | POA: Diagnosis not present

## 2016-12-03 DIAGNOSIS — Z23 Encounter for immunization: Secondary | ICD-10-CM | POA: Diagnosis not present

## 2016-12-03 DIAGNOSIS — I2583 Coronary atherosclerosis due to lipid rich plaque: Secondary | ICD-10-CM | POA: Diagnosis not present

## 2016-12-03 DIAGNOSIS — F411 Generalized anxiety disorder: Secondary | ICD-10-CM | POA: Diagnosis not present

## 2016-12-03 LAB — POCT URINALYSIS DIPSTICK
BILIRUBIN UA: NEGATIVE
GLUCOSE UA: NEGATIVE
Ketones, UA: NEGATIVE
Leukocytes, UA: NEGATIVE
Nitrite, UA: NEGATIVE
Protein, UA: NEGATIVE
RBC UA: NEGATIVE
Urobilinogen, UA: NEGATIVE
pH, UA: 6

## 2016-12-03 MED ORDER — CITALOPRAM HYDROBROMIDE 20 MG PO TABS: 20.0000 mg | ORAL_TABLET | Freq: Every day | ORAL | 3 refills | Status: DC

## 2016-12-03 NOTE — Patient Instructions (Addendum)
  HEALTH MAINTENANCE RECOMMENDATIONS:  It is recommended that you get at least 30 minutes of aerobic exercise at least 5 days/week (for weight loss, you may need as much as 60-90 minutes). This can be any activity that gets your heart rate up. This can be divided in 10-15 minute intervals if needed, but try and build up your endurance at least once a week.  Weight bearing exercise is also recommended twice weekly.  Eat a healthy diet with lots of vegetables, fruits and fiber.  "Colorful" foods have a lot of vitamins (ie green vegetables, tomatoes, red peppers, etc).  Limit sweet tea, regular sodas and alcoholic beverages, all of which has a lot of calories and sugar.  Up to 2 alcoholic drinks daily may be beneficial for men (unless trying to lose weight, watch sugars).  Drink a lot of water.  Sunscreen of at least SPF 30 should be used on all sun-exposed parts of the skin when outside between the hours of 10 am and 4 pm (not just when at beach or pool, but even with exercise, golf, tennis, and yard work!)  Use a sunscreen that says "broad spectrum" so it covers both UVA and UVB rays, and make sure to reapply every 1-2 hours.  Remember to change the batteries in your smoke detectors when changing your clock times in the spring and fall.  Use your seat belt every time you are in a car, and please drive safely and not be distracted with cell phones and texting while driving.   [name] , Thank you for taking time to come for your Medicare Wellness Visit. I appreciate your ongoing commitment to your health goals. Please review the following plan we discussed and let me know if I can assist you in the future.   These are the goals we discussed: Goals    None      This is a list of the screening recommended for you and due dates:  Health Maintenance  Topic Date Due  . Pneumonia vaccines (2 of 2 - PPSV23) 11/28/2016  . Colon Cancer Screening  11/14/2018  . Tetanus Vaccine  11/02/2021  . Flu  Shot  Completed  . Shingles Vaccine  Completed  .  Hepatitis C: One time screening is recommended by Center for Disease Control  (CDC) for  adults born from [birth year] through 1966.   Completed   You were given the last of the pneumonia vaccines today.  Consider getting the new Shingrix (shingles vaccine) when available. Check with your insurance to see if it is covered, and if you can get at the office vs the pharmacy (pharmacy if covered by part D Medicare).  Please cut back on the sweets/sugar in the diet.  Your triglycerides were slightly up, as well as your fasting sugar and your weight. Make sure to get at least 150 minutes of aerobic exercise each week. Don't rush to come down any further on the citalopram, and feel free to take 20mg  every day if that would be just as easy. Continue to monitor BP, and let Dr. Marlou Porch know if it is truly running >135-140/85-90 (it was up here today).

## 2017-02-10 ENCOUNTER — Other Ambulatory Visit: Payer: Self-pay | Admitting: Family Medicine

## 2017-02-10 DIAGNOSIS — F411 Generalized anxiety disorder: Secondary | ICD-10-CM

## 2017-02-18 ENCOUNTER — Other Ambulatory Visit: Payer: Self-pay | Admitting: Family Medicine

## 2017-02-18 DIAGNOSIS — F411 Generalized anxiety disorder: Secondary | ICD-10-CM

## 2017-04-24 ENCOUNTER — Encounter: Payer: Self-pay | Admitting: Cardiology

## 2017-04-28 ENCOUNTER — Encounter: Payer: Self-pay | Admitting: Cardiology

## 2017-04-28 ENCOUNTER — Ambulatory Visit (INDEPENDENT_AMBULATORY_CARE_PROVIDER_SITE_OTHER): Payer: PPO | Admitting: Cardiology

## 2017-04-28 VITALS — BP 138/82 | HR 62 | Ht 70.0 in | Wt 212.8 lb

## 2017-04-28 DIAGNOSIS — I2583 Coronary atherosclerosis due to lipid rich plaque: Secondary | ICD-10-CM

## 2017-04-28 DIAGNOSIS — I2510 Atherosclerotic heart disease of native coronary artery without angina pectoris: Secondary | ICD-10-CM

## 2017-04-28 DIAGNOSIS — E7800 Pure hypercholesterolemia, unspecified: Secondary | ICD-10-CM | POA: Diagnosis not present

## 2017-04-28 DIAGNOSIS — I10 Essential (primary) hypertension: Secondary | ICD-10-CM

## 2017-04-28 NOTE — Progress Notes (Signed)
[facility] [address], [address] [place], [place]  [address] Phone: [phone] Fax:  [phone]  Date:  04/28/2017   ID:  [name], DOB [birth year], MRN [id number]  PCP:  Vikki Ports, MD   History of Present Illness: [name] is a 66 y.o. male with coronary artery disease status post DES to LAD and 06/2009 as well as DES to LAD 05/23/14 distal to previously placed stent with hypertension, hyperlipidemia, anxiety here for followup.  Previously had mild chest discomfort with exercise but this is infrequent. He goes to the [facility] 3 days a week and exercises with a target heart rate of about 135 bpm. He had a kidney stone that has resolved.   Celexa working well for him. Previously participated in the Cardiac maintenance program. He wishes to be off of medications if possible. Beta blocker, carvedilol 3.125 twice a day was added in September of 2015 as a trial. His blood pressures are better controlled. At home usually 756 systolic. overall he is doing well.  04/28/17-complaining of some swelling in his feet which has been chronic. Otherwise he is doing quite well. He is put on some weight. No chest pain, no shortness of breath, no syncope, no bleeding. Overall doing quite well.  Studies:  - LHC (05/23/14): LAD proximal to previously placed stent 30%, mid LAD distal to the stent 99%, CFX and RCA No CAD EF 50% with ant HK. PCI: 18 x 2.5 mm diameter Xience Alpine DES to mid LAD.  - Nuclear (04/26/14): High risk stress nuclear study . There is a small but severe area of ischemia in the distal anterior wall. EF 46%. Ant HK.   Recent Labs:  11/03/2013: ALT 37; HDL Cholesterol by NMR 41; LDL (calc) 51  05/25/2014: Creatinine 1.02; Hemoglobin 14.1; Potassium 4.6       Wt Readings from Last 3 Encounters:  04/28/17 212 lb 12.8 oz (96.5 kg)  12/03/16 211 lb (95.7 kg)  04/29/16 208 lb (94.3 kg)     Past Medical History:  Diagnosis Date  . CAD (coronary artery disease) 2010, 2015  stents   a. s/p DES to LAD 06/2009. b. LHC (05/23/14): LAD proximal to previously placed stent  30%, mid LAD distal to the stent 99%, CFX and RCA No CAD EF 50% with ant HK. PCI:  18 x 2.5 mm diameter Xience Alpine DES to mid LAD.  Marland Kitchen Colon polyps 10/2015   tubular adeomas x 4  . Diverticulosis 10/2015   noted on colonoscopy  . ED (erectile dysfunction)   . GERD (gastroesophageal reflux disease)   . Hyperlipidemia   . Hypertension   . Inguinal hernia, left 08/2014   seen on CT (containing peritoneal fat)  . Internal hemorrhoids 10/2015   noted on colonoscopy  . Irregular heartbeat    started on beta blocker by Dr. Marlou Porch, improved  . Kidney stone 03/2010   08/2014-distal R ureter  . Myocardial infarction ([place])    2010  . Prostate cancer ([place]) 01/2008    s/p brachiotherapy; Dr. Karsten Ro  . Shingles 2010    Past Surgical History:  Procedure Laterality Date  . CARDIAC CATHETERIZATION  05/23/2014  . COLONOSCOPY  2005, 10/2015  . CORONARY ANGIOPLASTY WITH STENT PLACEMENT  06/2009   "1"  . INGUINAL HERNIA REPAIR Left 10/1998  . LEFT HEART CATHETERIZATION WITH CORONARY ANGIOGRAM N/A 05/23/2014   Procedure: LEFT HEART CATHETERIZATION WITH CORONARY ANGIOGRAM;  Surgeon: Candee Furbish, MD;  Location: [facility] CATH  LAB;  Service: Cardiovascular;  Laterality: N/A;  . PERCUTANEOUS CORONARY STENT INTERVENTION (PCI-S) N/A 05/24/2014   Procedure: PERCUTANEOUS CORONARY STENT INTERVENTION (PCI-S);  Surgeon: Sinclair Grooms, MD;  Location: [facility] CATH LAB;  Service: Cardiovascular;  Laterality: N/A;  . RADIOACTIVE SEED IMPLANT  01/2008   prostate cancer  . VASECTOMY      Current Outpatient Prescriptions  Medication Sig Dispense Refill  . aspirin 81 MG tablet Take 81 mg by mouth daily.      Marland Kitchen atorvastatin (LIPITOR) 40 MG tablet Take 1 tablet (40 mg total) by mouth daily. 90 tablet 3  . carvedilol (COREG) 3.125 MG tablet TAKE 1 TABLET (3.125 MG TOTAL) BY MOUTH 2 (TWO) TIMES DAILY. 60 tablet 11  . cholecalciferol  (VITAMIN D) 1000 units tablet Take 1,000 Units by mouth daily.    . citalopram (CELEXA) 20 MG tablet Take 1 tablet (20 mg total) by mouth daily. 90 tablet 3  . diphenhydrAMINE (BENADRYL) 25 MG tablet Take 50 mg by mouth every evening.    . fish oil-omega-3 fatty acids 1000 MG capsule Take 1 g by mouth daily.      Marland Kitchen LEVITRA 20 MG tablet TAKE 1 TABLET BY MOUTH AS NEEDED *PRIOR AUTH*  11  . pantoprazole (PROTONIX) 40 MG tablet Take 1 tablet (40 mg total) by mouth daily. 30 tablet 11  . triamcinolone cream (KENALOG) 0.1 % Apply sparingly to affected areas of skin twice daily for up to 2 weeks. 80 g 1   No current facility-administered medications for this visit.     Allergies:    Allergies  Allergen Reactions  . Imdur [Isosorbide Mononitrate] Rash    Social History:  The patient  reports that he quit smoking about 40 years ago. His smoking use included Cigarettes, Pipe, and Cigars. He has never used smokeless tobacco. He reports that he drinks about 3.0 oz of alcohol per week . He reports that he does not use drugs.   Works at [employer] History  Problem Relation Age of Onset  . Heart disease Father 104  . Prostate cancer Father 50  . Hypertension Father   . Hyperlipidemia Father   . Bladder Cancer Mother   . Eating disorder Mother   . Hodgkin's lymphoma Daughter   . Cardiomyopathy Daughter     related to treatment for lymphoma  . Thyroid cancer Daughter   . Heart disease Daughter     congenital ASD, s/p repair  . Cancer Daughter     Hodkin's; thyroid CA; lymphoma; mesothelioma 10/2015  . Heart disease Brother     arrhythmia  . Prostate cancer Paternal Grandfather   . Diabetes Neg Hx   . Colon cancer Neg Hx   . Colon polyps Neg Hx   . Esophageal cancer Neg Hx   . Rectal cancer Neg Hx   . Stomach cancer Neg Hx     ROS:  Unless specified above all other review of systems negative.   PHYSICAL EXAM: VS:  BP 138/82   Pulse 62   Ht 5\' 10"  (1.778 m)   Wt 212 lb 12.8 oz  (96.5 kg)   BMI 30.53 kg/m  GEN: Well nourished, well developed, in no acute distress  HEENT: normal  Neck: no JVD, carotid bruits, or masses Cardiac: RRR; no murmurs, rubs, or gallops,no edema  Respiratory:  clear to auscultation bilaterally, normal work of breathing GI: soft, nontender, nondistended, + BS MS: no deformity or atrophy  Skin: warm and dry, no  rash Neuro:  Alert and Oriented x 3, Strength and sensation are intact Psych: euthymic mood, full affect   EKG:  EKG was ordered today. 04/28/17-sinus rhythm 62 with no other abnormalities personally viewed- 04/29/16-sinus rhythm, 60, PAC, no other abnormalities personally viewed.     ASSESSMENT AND PLAN:   Coronary artery disease  - Prior LAD stents placed, drug-eluting both in 2010 as well as 05/24/14.  - In the past he has felt mild chest discomfort at times during exercise. Doing well now.   - Continuing with medication.  - If anginal symptoms worsen, further testing may be warranted.  Essential hypertension  - Has been labile in the past.  - Takes low-dose carvedilol. Has had trouble with fatigue with higher dosing.  - We can always add ACE inhibitor in the future if necessary.  Hyperlipidemia  - Atorvastatin 40 mg. LDL at goal less than 70.  - No myalgias.  One year f/u  Signed, Candee Furbish, MD [facility]  04/28/2017 10:01 AM

## 2017-04-28 NOTE — Patient Instructions (Signed)

## 2017-05-07 ENCOUNTER — Other Ambulatory Visit: Payer: Self-pay

## 2017-05-07 MED ORDER — ATORVASTATIN CALCIUM 40 MG PO TABS: 40.0000 mg | ORAL_TABLET | Freq: Every day | ORAL | 3 refills | Status: DC

## 2017-05-11 ENCOUNTER — Other Ambulatory Visit: Payer: Self-pay | Admitting: Cardiology

## 2017-06-15 ENCOUNTER — Ambulatory Visit (INDEPENDENT_AMBULATORY_CARE_PROVIDER_SITE_OTHER): Payer: PPO | Admitting: Family Medicine

## 2017-06-15 VITALS — BP 132/70 | HR 50 | Temp 98.5°F | Resp 16 | Ht 70.0 in | Wt 211.2 lb

## 2017-06-15 DIAGNOSIS — R1030 Lower abdominal pain, unspecified: Secondary | ICD-10-CM

## 2017-06-15 NOTE — Progress Notes (Signed)
Chief Complaint  Patient presents with  . Abdominal Pain    on lower ABD. reports that he has has shingles before and it feels like that. No rash. Concerns with appendicitis.     He has had a "stitch in his side" on the right for the last couple of days.  It isn't sharp, but nagging, fairly constant.  No change with changes in position, a little sharper if walking a lot.  First noticed 4 days ago, no change. No radiation.   8 years ago he had shingles on that side--that pain was more severe, developed a rash after a few days. No trauma, injury.  He has been playing pickle ball, no known injury.  He last played on Thursday, thinks the pain started prior to playing. Bowels are normal.  4/10 pain. Hasn't tried anything for the discomfort, was just waiting to see if it went away.  PMH, PSH, SH reviewed  Outpatient Encounter Prescriptions as of 06/15/2017  Medication Sig  . aspirin 81 MG tablet Take 81 mg by mouth daily.    Marland Kitchen atorvastatin (LIPITOR) 40 MG tablet Take 1 tablet (40 mg total) by mouth daily.  . carvedilol (COREG) 3.125 MG tablet TAKE 1 TABLET (3.125 MG TOTAL) BY MOUTH 2 (TWO) TIMES DAILY.  . cholecalciferol (VITAMIN D) 1000 units tablet Take 1,000 Units by mouth daily.  . citalopram (CELEXA) 20 MG tablet Take 1 tablet (20 mg total) by mouth daily.  . diphenhydrAMINE (BENADRYL) 25 MG tablet Take 50 mg by mouth every evening.  . fish oil-omega-3 fatty acids 1000 MG capsule Take 1 g by mouth daily.    Marland Kitchen LEVITRA 20 MG tablet TAKE 1 TABLET BY MOUTH AS NEEDED *PRIOR AUTH*  . pantoprazole (PROTONIX) 40 MG tablet TAKE 1 TABLET BY MOUTH EVERY DAY  . triamcinolone cream (KENALOG) 0.1 % Apply sparingly to affected areas of skin twice daily for up to 2 weeks.   No facility-administered encounter medications on file as of 06/15/2017.    Allergies  Allergen Reactions  . Imdur [Isosorbide Mononitrate] Rash   ROS: No fever, chills, nausea, vomiting, bowel changes, blood in stool, urinary  complaints, rashes, URI symptoms, chest pain, shortness of breath. See HPI  PHYSICAL EXAM:  BP 132/70   Pulse (!) 50   Temp 98.5 F (36.9 C) (Oral)   Resp 16   Ht 5\' 10"  (1.778 m)   Wt 211 lb 3.2 oz (95.8 kg)   SpO2 98%   BMI 30.30 kg/m   Well developed, pleasant, overweight male in good spirits, in no distress Neck: no lymphadenopathy, thyromegaly or mass HEENT: conjunctiva and sclera are clear, EOMI Heart: regular rate and rhythm Lungs: clear bilaterally Back: no CVA or spinal tenderness Abdomen: Tender on the right side of abdomen at the level of his umbilicus, radiating slightly laterally. Extremities: no edema Neuro: alert and oriented, cranial nerves intact, normal strength, sensation Psych: normal mood, affect, hygiene and grooming  Urine dip normal, SG >=1.030  ASSESSMENT/PLAN:  Lower abdominal pain - suspect superficial, possible strain. Trial heat, stretches, NSAIDs. f/u if changing/worsening/persisting   Try heating pad or moist heat to the painful area for 15 minutes three to four times daily.  After heat, try the stretches shown. You can take Aleve twice daily with food (if ineffective but not bothering your stomach you can double up and take 2 twice daily --this is the prescription equivalent) for up to 7-10 days.    If you develop fever, worsening pain, or  other new symptoms, please return for re-evaluation and further studies may be indicated (bloodwork, imaging).  Drink plenty of water.

## 2017-06-15 NOTE — Patient Instructions (Signed)
  Try heating pad or moist heat to the painful area for 15 minutes three to four times daily.  After heat, try the stretches shown. You can take Aleve twice daily with food (if ineffective but not bothering your stomach you can double up and take 2 twice daily --this is the prescription equivalent) for up to 7-10 days.    If you develop fever, worsening pain, or other new symptoms, please return for re-evaluation and further studies may be indicated (bloodwork, imaging).  Drink plenty of water.

## 2017-06-16 ENCOUNTER — Encounter: Payer: Self-pay | Admitting: Family Medicine

## 2017-06-16 LAB — POCT URINALYSIS DIP (PROADVANTAGE DEVICE)
BILIRUBIN UA: NEGATIVE mg/dL
Bilirubin, UA: NEGATIVE
Blood, UA: NEGATIVE
Glucose, UA: NEGATIVE mg/dL
LEUKOCYTES UA: NEGATIVE
Nitrite, UA: NEGATIVE
PH UA: 5.5 (ref 5.0–8.0)
PROTEIN UA: NEGATIVE mg/dL
Specific Gravity, Urine: 1.03
UUROB: NEGATIVE

## 2017-09-15 DIAGNOSIS — F4324 Adjustment disorder with disturbance of conduct: Secondary | ICD-10-CM | POA: Diagnosis not present

## 2017-09-23 DIAGNOSIS — F429 Obsessive-compulsive disorder, unspecified: Secondary | ICD-10-CM | POA: Diagnosis not present

## 2017-09-29 DIAGNOSIS — F429 Obsessive-compulsive disorder, unspecified: Secondary | ICD-10-CM | POA: Diagnosis not present

## 2017-10-12 DIAGNOSIS — F429 Obsessive-compulsive disorder, unspecified: Secondary | ICD-10-CM | POA: Diagnosis not present

## 2017-10-27 DIAGNOSIS — F429 Obsessive-compulsive disorder, unspecified: Secondary | ICD-10-CM | POA: Diagnosis not present

## 2017-11-10 DIAGNOSIS — Z8546 Personal history of malignant neoplasm of prostate: Secondary | ICD-10-CM | POA: Diagnosis not present

## 2017-11-10 DIAGNOSIS — C61 Malignant neoplasm of prostate: Secondary | ICD-10-CM | POA: Diagnosis not present

## 2017-11-10 DIAGNOSIS — F429 Obsessive-compulsive disorder, unspecified: Secondary | ICD-10-CM | POA: Diagnosis not present

## 2017-11-10 DIAGNOSIS — N400 Enlarged prostate without lower urinary tract symptoms: Secondary | ICD-10-CM | POA: Diagnosis not present

## 2017-11-23 DIAGNOSIS — F429 Obsessive-compulsive disorder, unspecified: Secondary | ICD-10-CM | POA: Diagnosis not present

## 2017-11-24 DIAGNOSIS — F4324 Adjustment disorder with disturbance of conduct: Secondary | ICD-10-CM | POA: Diagnosis not present

## 2017-12-07 ENCOUNTER — Ambulatory Visit: Payer: PPO | Admitting: Family Medicine

## 2017-12-08 DIAGNOSIS — F429 Obsessive-compulsive disorder, unspecified: Secondary | ICD-10-CM | POA: Diagnosis not present

## 2017-12-31 DIAGNOSIS — F429 Obsessive-compulsive disorder, unspecified: Secondary | ICD-10-CM | POA: Diagnosis not present

## 2018-01-13 NOTE — Progress Notes (Signed)
Chief Complaint  Patient presents with  . Medicare Wellness    nonfasting(blood in lab) AWV/CPE. Has eye exam with [facility]. No concerns today.     [name] is a 67 y.o. male who presents for annual wellness visit and follow-up on chronic medical conditions.  He has no specific concerns.  He was last seen here in June with R abdominal pain, felt to be strain. It took a few weeks to resolve, no further problems.  Last year he reported some jaw pain (mostly left, only occasionally on the right), requiring some NSAIDs. The dentist does not think it was related to dental issues (but has been having crown issues recently).  Occurs every few weeks, relieved by ibuprofen. Now is more on the right than the left. He had been eating some hard food on that side, and that is how he broke the crown. Pain is not exertional.  He has some hearing loss, bilaterally. Denies ear pain, ringing or other problems.  He hasn't had hearing checked yet still. Denies any significant worsening over the last year.    HTN: BP's at home have been running 135-140/70 Denies headaches (rare, sometimes jaw pain causes headache), no dizziness (only if stands too quickly).  He sees slight swelling in the feet/ankles (sock mark only, not worsening). Hasn't had any chest pain since the last stent was placed. No dyspnea on exertion. He did have pizza with olives last night and had leftovers shortly before his appointment today.  Overall tries to limit salt in his diet.  CAD: He had another DES placed 05/23/14 to LAD. He completed cardiac rehab and Plavix x 1 year. He continues to see Dr. Marlou Porch, without any new changes in medication. Last seen in May, 2018.  (noted that pulse didn't tolerate higher doses of med; may need to add ACEI for BP control if needed).  Kidney stone: He had one in September 2015, took Flomax and passed the stone. No further problems. Prostate cancer: diagnosed 2008, treated with  brachytherapy.  Under the care of Dr. Karsten Ro, last seen in 10/2017, and reports that PSA was very good.  GERD: His Nexium was changed to Protonix a couple of years ago (due to interaction with Plavix). Initially he had some heartburn after the change, but it has resolved. He is now off the Plavix, but the Protonix seems to work well, so not interested in changing back to Nexium. Denies dysphagia. He has recurrent symptoms if he misses the medication for 2 days.  Hyperlipidemia: Patient is reportedly following a low-fat, low cholesterol diet. Compliant with medications and denies medication side effects. He admits that his diet hasn't been as good, eating more sugar, cookies/sweets.  He has continued to gain some weight, related to sugar/snacks he buys for the home.  ED: currently using a generic medication.  Previously used Levitra, which was a little more effective.  He states it is the "generic for Levitra", but does report that he believes it is called Sildenafil, dose of 20mg ; he takes 2 tablets at a time.  Denies side effects.  Anxiety: Doing very well. He decreased the citalopram dose to 20mg  last year, and subsequently further titrated down to 1/2 tablet alternating with 1 tablet (10/20 alternating). He noticed some increase in anxiety, so went back to taking 20mg  daily, and is doing well.   His daughter has mesothelioma, having increased pain recently due to tumors which cracked ribs, pressed on a nerve (s/p radiation recently).. Planning to enter  clinical trial at [employer] soon.  She and her 2 kids and husband, and their [place] Dane are living with them. She is on oxygen now (at night, with travel).   Vitamin D deficiency: He was treated in the past with prescription weekly replacement. Last check was 10/2016 and level was 30, while on 1000 IU daily.   Immunization History  Administered Date(s) Administered  . Influenza Split 11/03/2011, 10/29/2012  . Influenza, High Dose Seasonal  PF 09/03/2016  . Influenza,inj,Quad PF,6+ Mos 10/26/2013, 08/30/2014  . Influenza-Unspecified 10/16/2015  . Pneumococcal Conjugate-13 11/29/2015  . Pneumococcal Polysaccharide-23 12/03/2016  . Td 11/26/2005  . Tdap 11/03/2011  . Zoster 02/28/2014   Had flu shot in September at pharmacy Last colonoscopy: 10/2015-- +polyps x4, tubular adenomas; due to recheck in 3 years. Last PSA:10/2017 at Dr. Simone Curia office Ophtho: last year Dentist: twice yearly (more recently) Exercise: [employer] 3 days/week--pickleball (doubles) 3x/week x 75-90 min (gets HR up to 90-100); no longer using treadmill, weights, stair stepper. Gets 7500 steps 4x/week.  Walks his daughter's dog daily.   Other doctors caring for patient include: Urologist: Dr. Karsten Ro Dentist: [facility]. Mina Marble at [facility] Cardiologist: Dr. Marlou Porch GI: Dr. Hilarie Fredrickson Ophtho: Dr. Claudean Kinds   Depression screen: Negative (see epic screen). No falls in the last year Functional status screen notable only for decreased hearing, leakage of urine (slight urge incontinence)  End of Life Discussion: Patient hasa living will and medical power of attorney  Past Medical History:  Diagnosis Date  . CAD (coronary artery disease) 2010, 2015 stents   a. s/p DES to LAD 06/2009. b. LHC (05/23/14): LAD proximal to previously placed stent  30%, mid LAD distal to the stent 99%, CFX and RCA No CAD EF 50% with ant HK. PCI:  18 x 2.5 mm diameter Xience Alpine DES to mid LAD.  Marland Kitchen Colon polyps 10/2015   tubular adeomas x 4  . Diverticulosis 10/2015   noted on colonoscopy  . ED (erectile dysfunction)   . GERD (gastroesophageal reflux disease)   . Hyperlipidemia   . Hypertension   . Inguinal hernia, left 08/2014   seen on CT (containing peritoneal fat)  . Internal hemorrhoids 10/2015   noted on colonoscopy  . Irregular heartbeat    started on beta blocker by Dr. Marlou Porch, improved  . Kidney stone 03/2010   08/2014-distal R ureter  . Myocardial infarction  ([place])    2010  . Prostate cancer ([place]) 01/2008    s/p brachiotherapy; Dr. Karsten Ro  . Shingles 2010    Past Surgical History:  Procedure Laterality Date  . CARDIAC CATHETERIZATION  05/23/2014  . COLONOSCOPY  2005, 10/2015  . CORONARY ANGIOPLASTY WITH STENT PLACEMENT  06/2009   "1"  . INGUINAL HERNIA REPAIR Left 10/1998  . LEFT HEART CATHETERIZATION WITH CORONARY ANGIOGRAM N/A 05/23/2014   Procedure: LEFT HEART CATHETERIZATION WITH CORONARY ANGIOGRAM;  Surgeon: Candee Furbish, MD;  Location: [facility] CATH LAB;  Service: Cardiovascular;  Laterality: N/A;  . PERCUTANEOUS CORONARY STENT INTERVENTION (PCI-S) N/A 05/24/2014   Procedure: PERCUTANEOUS CORONARY STENT INTERVENTION (PCI-S);  Surgeon: Sinclair Grooms, MD;  Location: [facility] CATH LAB;  Service: Cardiovascular;  Laterality: N/A;  . RADIOACTIVE SEED IMPLANT  01/2008   prostate cancer  . VASECTOMY      Social History   Socioeconomic History  . Marital status: Married    Spouse name: Not on file  . Number of children: 3  . Years of education: Not on file  . Highest education level: Not  on file  Social Needs  . Financial resource strain: Not on file  . Food insecurity - worry: Not on file  . Food insecurity - inability: Not on file  . Transportation needs - medical: Not on file  . Transportation needs - non-medical: Not on file  Occupational History  . Occupation: Arts development officer: [employer]  Tobacco Use  . Smoking status: Former Smoker    Types: Cigarettes, Pipe, Cigars    Last attempt to quit: 12/29/1976    Years since quitting: 41.0  . Smokeless tobacco: Never Used  . Tobacco comment: distant tobacco h/o in college (2-3 packs/week); +pipe/cigar use occ  x 15 years; quit  in the 1990's  Substance and Sexual Activity  . Alcohol use: Yes    Alcohol/week: 3.0 oz    Types: 5 Cans of beer per week    Comment: 5 beers per week (1-2 max at a time)  . Drug use: No  . Sexual activity: Yes    Partners: Female  Other  Topics Concern  . Not on file  Social History Narrative   Married. Lives with daughter, son-in-law, 2 grandkids and their dog. Other daughter lives in [place] with 3 add'l grandchildren.   5 grandchildren (locally) and one in [place].   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    Family History  Problem Relation Age of Onset  . Heart disease Father 42  . Prostate cancer Father 63  . Hypertension Father   . Hyperlipidemia Father   . Bladder Cancer Mother   . Eating disorder Mother   . Hodgkin's lymphoma Daughter   . Cardiomyopathy Daughter        related to treatment for lymphoma  . Thyroid cancer Daughter   . Heart disease Daughter        congenital ASD, s/p repair  . Cancer Daughter        Hodkin's; thyroid CA; lymphoma; mesothelioma 10/2015  . Heart disease Brother        arrhythmia  . Prostate cancer Paternal Grandfather   . Diabetes Neg Hx   . Colon cancer Neg Hx   . Colon polyps Neg Hx   . Esophageal cancer Neg Hx   . Rectal cancer Neg Hx   . Stomach cancer Neg Hx     Outpatient Encounter Medications as of 01/14/2018  Medication Sig Note  . aspirin 81 MG tablet Take 81 mg by mouth daily.     Marland Kitchen atorvastatin (LIPITOR) 40 MG tablet Take 1 tablet (40 mg total) by mouth daily.   . carvedilol (COREG) 3.125 MG tablet TAKE 1 TABLET (3.125 MG TOTAL) BY MOUTH 2 (TWO) TIMES DAILY.   . cholecalciferol (VITAMIN D) 1000 units tablet Take 1,000 Units by mouth daily.   . citalopram (CELEXA) 20 MG tablet Take 1 tablet (20 mg total) by mouth daily.   . diphenhydrAMINE (BENADRYL) 25 MG tablet Take 50 mg by mouth every evening.   . fish oil-omega-3 fatty acids 1000 MG capsule Take 1 g by mouth daily.     . pantoprazole (PROTONIX) 40 MG tablet TAKE 1 TABLET BY MOUTH EVERY DAY   . triamcinolone cream (KENALOG) 0.1 % Apply sparingly to affected areas of skin twice daily for up to 2 weeks. 01/14/2018: Uses prn for eczema, mainly on lower legs  . LEVITRA 20 MG tablet TAKE 1 TABLET BY  MOUTH AS NEEDED *PRIOR AUTH* 01/14/2018: Unsure if taking a generic for this, vs sildenafil   No  facility-administered encounter medications on file as of 01/14/2018.     Allergies  Allergen Reactions  . Imdur [Isosorbide Mononitrate] Rash     ROS: The patient denies anorexia, fever. No vision loss, ear pain, hoarseness, chest pain, palpitations, syncope, dyspnea on exertion, cough, swelling, nausea, vomiting, diarrhea, constipation, abdominal pain, melena, hematochezia, nocturia, weakened urine stream, dysuria, genital lesions, joint pains, numbness, tingling, weakness, tremor, suspicious skin lesions, depression, anxiety, abnormal bleeding/bruising, or enlarged lymph nodes.  +erectile dysfunction, urge incontinence (mild) +Decrease in hearing. Heartburn only if misses his medication 2+ days. Intermittent right jaw pain. Some swelling in feet (sock marks), not worsening Intermittent rashes/eczema on legs, relieved by TAC (needs refill).  Gained 20# in the last 3 years (10 in the last 2, just 2# in the last year).    PHYSICAL EXAM:  BP (!) 150/102   Pulse 60   Ht 5\' 11"  (1.803 m)   Wt 214 lb 12.8 oz (97.4 kg)   BMI 29.96 kg/m    160/90 on repeat by MD  Wt Readings from Last 3 Encounters:  01/14/18 214 lb 12.8 oz (97.4 kg)  06/15/17 211 lb 3.2 oz (95.8 kg)  04/28/17 212 lb 12.8 oz (96.5 kg)    General Appearance:  Alert, cooperative, no distress, appears stated age.  Head:  Normocephalic, without obvious abnormality, atraumatic. Nontender at TMJ's, no clicking, good ROM  Eyes:  PERRL, conjunctiva/corneas clear, EOM's intact, fundi benign   Ears:  Normal TM's and EAC's  Nose:  Nares normal, mucosa, no erythema or purulence. Sinuses nontender  Throat:  Lips, mucosa, and tongue normal; teeth and gums normal   Neck:  Supple, no lymphadenopathy; thyroid: no enlargement/tenderness/ nodules; no carotid bruit or JVD   Back:  Spine nontender, no  curvature, ROM normal, no CVA tenderness   Lungs:  Clear to auscultation bilaterally without wheezes, rales or ronchi; respirations unlabored   Chest Wall:  No tenderness or deformity   Heart:  Regular rate and rhythm, S1 and S2 normal, no murmur, rub or gallop.   Breast Exam:  No chest wall tenderness, masses or gynecomastia   Abdomen:  Soft, non-tender, nondistended, normoactive bowel sounds, no masses, no hepatosplenomegaly   Genitalia:  circumsized penis without lesions. Testicles without lesions. No herniapalpable.  Rectal:  Deferred to Urologist.   Extremities:  No clubbing, cyanosis or edema.   Pulses:  2+ and symmetric all extremities   Skin:  Skin color, texture, turgor normal.  Lymph nodes:  Cervical, supraclavicular, and axillary nodes normal   Neurologic:  CNII-XII intact, normal strength, sensation and gait; reflexes 2+ and symmetric throughout   Psych:  Normal mood, affect, hygiene and grooming    ASSESSMENT/PLAN:  Annual physical exam - Plan: POCT Urinalysis DIP (Proadvantage Device)  Medicare annual wellness visit, subsequent  Essential hypertension - high today; low sodium diet, regular exercise/weight loss.  F/u 1 month with list of BP's.  Add ACEI or ARB if remains elevated - Plan: Comprehensive metabolic panel  Coronary artery disease due to lipid rich plaque - stable, asymptomatic. Continue ASA, statin, current meds  Pure hypercholesterolemia - Plan: Lipid panel  Vitamin D deficiency - Plan: VITAMIN D 25 Hydroxy (Vit-D Deficiency, Fractures)  Gastroesophageal reflux disease without esophagitis - requires daily PPI, well controlled  Personal history of prostate cancer - no e/o recurrence per yearly checks with Dr. Karsten Ro  Medication monitoring encounter - Plan: Comprehensive metabolic panel, CBC with Differential/Platelet, VITAMIN D 25 Hydroxy (Vit-D Deficiency, Fractures), Lipid  panel  Weight gain - counseled re: diet, exercise, encouraged loss - Plan: TSH  Anxiety state - controlled; ongoing stressors (daughter), continue medication - Plan: citalopram (CELEXA) 20 MG tablet  Nummular eczema - not flaring currently; needs refill for prn use for flares - Plan: triamcinolone cream (KENALOG) 0.1 %  Erectile dysfunction after prostate brachytherapy - discussed dosing of sildenafil, can use up to 100mg . Likely less effective than Levitra due to low dose   Recommended at least 30 minutes of aerobic activity at least 5 days/week, weight-bearing exercise 2x/wk; proper sunscreen use reviewed; healthy diet and alcohol recommendations (less than or equal to 2 drinks/day) reviewed; regular seatbelt use; changing batteries in smoke detectors. Self-testicular exams. Immunization recommendations discussed--continue high dose flu shots yearly.Shingrix recommended. Colonoscopy recommendations reviewed--due again 10/2018.   Your blood pressure was high today. Please monitor it regularly and record. Bring the list of blood pressures to your next appointment. Try and exercise at least 30 minutes daily, and cut back on the sodium in your diet. Try and lose some of the weight that you have gained.   Medicare Attestation I have personally reviewed: The patient's medical and social history Their use of alcohol, tobacco or illicit drugs Their current medications and supplements The patient's functional ability including ADLs,fall risks, home safety risks, cognitive, and hearing and visual impairment Diet and physical activities Evidence for depression or mood disorders  The patient's weight, height and BMI have been recorded in the chart.  I have made referrals, counseling, and provided education to the patient based on review of the above and I have provided the patient with a written personalized care plan for preventive services.

## 2018-01-14 ENCOUNTER — Ambulatory Visit (INDEPENDENT_AMBULATORY_CARE_PROVIDER_SITE_OTHER): Payer: PPO | Admitting: Family Medicine

## 2018-01-14 ENCOUNTER — Encounter: Payer: Self-pay | Admitting: Family Medicine

## 2018-01-14 VITALS — BP 150/102 | HR 60 | Ht 71.0 in | Wt 214.8 lb

## 2018-01-14 DIAGNOSIS — Z0000 Encounter for general adult medical examination without abnormal findings: Secondary | ICD-10-CM

## 2018-01-14 DIAGNOSIS — I2510 Atherosclerotic heart disease of native coronary artery without angina pectoris: Secondary | ICD-10-CM

## 2018-01-14 DIAGNOSIS — Z8546 Personal history of malignant neoplasm of prostate: Secondary | ICD-10-CM

## 2018-01-14 DIAGNOSIS — E559 Vitamin D deficiency, unspecified: Secondary | ICD-10-CM

## 2018-01-14 DIAGNOSIS — N5235 Erectile dysfunction following radiation therapy: Secondary | ICD-10-CM

## 2018-01-14 DIAGNOSIS — L300 Nummular dermatitis: Secondary | ICD-10-CM | POA: Diagnosis not present

## 2018-01-14 DIAGNOSIS — R635 Abnormal weight gain: Secondary | ICD-10-CM

## 2018-01-14 DIAGNOSIS — I2583 Coronary atherosclerosis due to lipid rich plaque: Secondary | ICD-10-CM | POA: Diagnosis not present

## 2018-01-14 DIAGNOSIS — F429 Obsessive-compulsive disorder, unspecified: Secondary | ICD-10-CM | POA: Diagnosis not present

## 2018-01-14 DIAGNOSIS — F411 Generalized anxiety disorder: Secondary | ICD-10-CM | POA: Diagnosis not present

## 2018-01-14 DIAGNOSIS — I10 Essential (primary) hypertension: Secondary | ICD-10-CM | POA: Diagnosis not present

## 2018-01-14 DIAGNOSIS — K219 Gastro-esophageal reflux disease without esophagitis: Secondary | ICD-10-CM | POA: Diagnosis not present

## 2018-01-14 DIAGNOSIS — E7800 Pure hypercholesterolemia, unspecified: Secondary | ICD-10-CM | POA: Diagnosis not present

## 2018-01-14 DIAGNOSIS — Z5181 Encounter for therapeutic drug level monitoring: Secondary | ICD-10-CM | POA: Diagnosis not present

## 2018-01-14 LAB — POCT URINALYSIS DIP (PROADVANTAGE DEVICE)
Bilirubin, UA: NEGATIVE
Blood, UA: NEGATIVE
GLUCOSE UA: NEGATIVE mg/dL
Ketones, POC UA: NEGATIVE mg/dL
Leukocytes, UA: NEGATIVE
Nitrite, UA: NEGATIVE
Protein Ur, POC: NEGATIVE mg/dL
SPECIFIC GRAVITY, URINE: 1.03
UUROB: NEGATIVE
pH, UA: 6 (ref 5.0–8.0)

## 2018-01-14 MED ORDER — CITALOPRAM HYDROBROMIDE 20 MG PO TABS: 20.0000 mg | ORAL_TABLET | Freq: Every day | ORAL | 3 refills | Status: DC

## 2018-01-14 MED ORDER — TRIAMCINOLONE ACETONIDE 0.1 % EX CREA: TOPICAL_CREAM | CUTANEOUS | 1 refills | Status: DC

## 2018-01-14 NOTE — Patient Instructions (Addendum)
HEALTH MAINTENANCE RECOMMENDATIONS:  It is recommended that you get at least 30 minutes of aerobic exercise at least 5 days/week (for weight loss, you may need as much as 60-90 minutes). This can be any activity that gets your heart rate up. This can be divided in 10-15 minute intervals if needed, but try and build up your endurance at least once a week.  Weight bearing exercise is also recommended twice weekly.  Eat a healthy diet with lots of vegetables, fruits and fiber.  "Colorful" foods have a lot of vitamins (ie green vegetables, tomatoes, red peppers, etc).  Limit sweet tea, regular sodas and alcoholic beverages, all of which has a lot of calories and sugar.  Up to 2 alcoholic drinks daily may be beneficial for men (unless trying to lose weight, watch sugars).  Drink a lot of water.  Sunscreen of at least SPF 30 should be used on all sun-exposed parts of the skin when outside between the hours of 10 am and 4 pm (not just when at beach or pool, but even with exercise, golf, tennis, and yard work!)  Use a sunscreen that says "broad spectrum" so it covers both UVA and UVB rays, and make sure to reapply every 1-2 hours.  Remember to change the batteries in your smoke detectors when changing your clock times in the spring and fall.  Use your seat belt every time you are in a car, and please drive safely and not be distracted with cell phones and texting while driving.   [name] , Thank you for taking time to come for your Medicare Wellness Visit. I appreciate your ongoing commitment to your health goals. Please review the following plan we discussed and let me know if I can assist you in the future.   These are the goals we discussed: Goals    None      This is a list of the screening recommended for you and due dates:  Health Maintenance  Topic Date Due  . Flu Shot  07/29/2017  . Colon Cancer Screening  11/14/2018  . Tetanus Vaccine  11/02/2021  .  Hepatitis C: One time  screening is recommended by Center for Disease Control  (CDC) for  adults born from [birth year] through 1966.   Completed  . Pneumonia vaccines  Completed   Continue high dose flu shots yearly.  I recommend getting the new shingles vaccine (Shingrix). You will need to check with your insurance to see if it is covered, and if covered by Medicare Part D, you need to get from the pharmacy rather than our office.  It is a series of 2 injections, spaced 2 months apart.  Your blood pressure was high today. Please monitor it regularly and record. Bring the list of blood pressures to your next appointment. Try and exercise at least 30 minutes daily, and cut back on the sodium in your diet. Try and lose some of the weight that you have gained.  Double check your medications.  If you have 20mg  of sildenafil (generic for viagra), then you can actually take up to 100mg  (5 tablets) to be equivalent to the highest dose of Viagra. If 2 isn't effective currently, try 3-4 before increasing to 100mg .   Low-Sodium Eating Plan Sodium, which is an element that makes up salt, helps you maintain a healthy balance of fluids in your body. Too much sodium can increase your blood pressure and cause fluid and waste to be held in your body. Your health  care provider or dietitian may recommend following this plan if you have high blood pressure (hypertension), kidney disease, liver disease, or heart failure. Eating less sodium can help lower your blood pressure, reduce swelling, and protect your heart, liver, and kidneys. What are tips for following this plan? General guidelines  Most people on this plan should limit their sodium intake to 1,500-2,000 mg (milligrams) of sodium each day. Reading food labels  The Nutrition Facts label lists the amount of sodium in one serving of the food. If you eat more than one serving, you must multiply the listed amount of sodium by the number of servings.  Choose foods with less than 140 mg  of sodium per serving.  Avoid foods with 300 mg of sodium or more per serving. Shopping  Look for lower-sodium products, often labeled as "low-sodium" or "no salt added."  Always check the sodium content even if foods are labeled as "unsalted" or "no salt added".  Buy fresh foods. ? Avoid canned foods and premade or frozen meals. ? Avoid canned, cured, or processed meats  Buy breads that have less than 80 mg of sodium per slice. Cooking  Eat more home-cooked food and less restaurant, buffet, and fast food.  Avoid adding salt when cooking. Use salt-free seasonings or herbs instead of table salt or sea salt. Check with your health care provider or pharmacist before using salt substitutes.  Cook with plant-based oils, such as canola, sunflower, or olive oil. Meal planning  When eating at a restaurant, ask that your food be prepared with less salt or no salt, if possible.  Avoid foods that contain MSG (monosodium glutamate). MSG is sometimes added to [place] food, bouillon, and some canned foods. What foods are recommended? The items listed may not be a complete list. Talk with your dietitian about what dietary choices are best for you. Grains Low-sodium cereals, including oats, puffed wheat and rice, and shredded wheat. Low-sodium crackers. Unsalted rice. Unsalted pasta. Low-sodium bread. Whole-grain breads and whole-grain pasta. Vegetables Fresh or frozen vegetables. "No salt added" canned vegetables. "No salt added" tomato sauce and paste. Low-sodium or reduced-sodium tomato and vegetable juice. Fruits Fresh, frozen, or canned fruit. Fruit juice. Meats and other protein foods Fresh or frozen (no salt added) meat, poultry, seafood, and fish. Low-sodium canned tuna and salmon. Unsalted nuts. Dried peas, beans, and lentils without added salt. Unsalted canned beans. Eggs. Unsalted nut butters. Dairy Milk. Soy milk. Cheese that is naturally low in sodium, such as ricotta cheese, fresh  mozzarella, or Swiss cheese Low-sodium or reduced-sodium cheese. Cream cheese. Yogurt. Fats and oils Unsalted butter. Unsalted margarine with no trans fat. Vegetable oils such as canola or olive oils. Seasonings and other foods Fresh and dried herbs and spices. Salt-free seasonings. Low-sodium mustard and ketchup. Sodium-free salad dressing. Sodium-free light mayonnaise. Fresh or refrigerated horseradish. Lemon juice. Vinegar. Homemade, reduced-sodium, or low-sodium soups. Unsalted popcorn and pretzels. Low-salt or salt-free chips. What foods are not recommended? The items listed may not be a complete list. Talk with your dietitian about what dietary choices are best for you. Grains Instant hot cereals. Bread stuffing, pancake, and biscuit mixes. Croutons. Seasoned rice or pasta mixes. Noodle soup cups. Boxed or frozen macaroni and cheese. Regular salted crackers. Self-rising flour. Vegetables Sauerkraut, pickled vegetables, and relishes. Olives. [place] fries. Onion rings. Regular canned vegetables (not low-sodium or reduced-sodium). Regular canned tomato sauce and paste (not low-sodium or reduced-sodium). Regular tomato and vegetable juice (not low-sodium or reduced-sodium). Frozen vegetables in sauces.  Meats and other protein foods Meat or fish that is salted, canned, smoked, spiced, or pickled. Bacon, ham, sausage, hotdogs, corned beef, chipped beef, packaged lunch meats, salt pork, jerky, pickled herring, anchovies, regular canned tuna, sardines, salted nuts. Dairy Processed cheese and cheese spreads. Cheese curds. Blue cheese. Feta cheese. String cheese. Regular cottage cheese. Buttermilk. Canned milk. Fats and oils Salted butter. Regular margarine. Ghee. Bacon fat. Seasonings and other foods Onion salt, garlic salt, seasoned salt, table salt, and sea salt. Canned and packaged gravies. Worcestershire sauce. Tartar sauce. Barbecue sauce. Teriyaki sauce. Soy sauce, including reduced-sodium.  Steak sauce. Fish sauce. Oyster sauce. Cocktail sauce. Horseradish that you find on the shelf. Regular ketchup and mustard. Meat flavorings and tenderizers. Bouillon cubes. Hot sauce and Tabasco sauce. Premade or packaged marinades. Premade or packaged taco seasonings. Relishes. Regular salad dressings. Salsa. Potato and tortilla chips. Corn chips and puffs. Salted popcorn and pretzels. Canned or dried soups. Pizza. Frozen entrees and pot pies. Summary  Eating less sodium can help lower your blood pressure, reduce swelling, and protect your heart, liver, and kidneys.  Most people on this plan should limit their sodium intake to 1,500-2,000 mg (milligrams) of sodium each day.  Canned, boxed, and frozen foods are high in sodium. Restaurant foods, fast foods, and pizza are also very high in sodium. You also get sodium by adding salt to food.  Try to cook at home, eat more fresh fruits and vegetables, and eat less fast food, canned, processed, or prepared foods. This information is not intended to replace advice given to you by your health care provider. Make sure you discuss any questions you have with your health care provider. Document Released: 06/06/2002 Document Revised: 12/08/2016 Document Reviewed: 12/08/2016 Elsevier Interactive Patient Education  [employer].

## 2018-01-15 LAB — CBC WITH DIFFERENTIAL/PLATELET
Basophils Absolute: 0 10*3/uL (ref 0.0–0.2)
Basos: 0 %
EOS (ABSOLUTE): 0.2 10*3/uL (ref 0.0–0.4)
EOS: 3 %
HEMATOCRIT: 45.3 % (ref 37.5–51.0)
HEMOGLOBIN: 15.3 g/dL (ref 13.0–17.7)
Immature Grans (Abs): 0 10*3/uL (ref 0.0–0.1)
Immature Granulocytes: 0 %
LYMPHS ABS: 1.7 10*3/uL (ref 0.7–3.1)
Lymphs: 31 %
MCH: 31.8 pg (ref 26.6–33.0)
MCHC: 33.8 g/dL (ref 31.5–35.7)
MCV: 94 fL (ref 79–97)
MONOCYTES: 11 %
Monocytes Absolute: 0.6 10*3/uL (ref 0.1–0.9)
NEUTROS ABS: 2.9 10*3/uL (ref 1.4–7.0)
Neutrophils: 55 %
Platelets: 190 10*3/uL (ref 150–379)
RBC: 4.81 x10E6/uL (ref 4.14–5.80)
RDW: 14.1 % (ref 12.3–15.4)
WBC: 5.3 10*3/uL (ref 3.4–10.8)

## 2018-01-15 LAB — COMPREHENSIVE METABOLIC PANEL
ALBUMIN: 4.3 g/dL (ref 3.6–4.8)
ALT: 32 IU/L (ref 0–44)
AST: 28 IU/L (ref 0–40)
Albumin/Globulin Ratio: 1.7 (ref 1.2–2.2)
Alkaline Phosphatase: 67 IU/L (ref 39–117)
BUN / CREAT RATIO: 13 (ref 10–24)
BUN: 16 mg/dL (ref 8–27)
Bilirubin Total: 0.6 mg/dL (ref 0.0–1.2)
CO2: 21 mmol/L (ref 20–29)
CREATININE: 1.19 mg/dL (ref 0.76–1.27)
Calcium: 9 mg/dL (ref 8.6–10.2)
Chloride: 106 mmol/L (ref 96–106)
GFR calc Af Amer: 73 mL/min/{1.73_m2} (ref >59)
GFR calc non Af Amer: 63 mL/min/{1.73_m2} (ref >59)
GLUCOSE: 101 mg/dL — AB (ref 65–99)
Globulin, Total: 2.6 g/dL (ref 1.5–4.5)
Potassium: 5 mmol/L (ref 3.5–5.2)
Sodium: 142 mmol/L (ref 134–144)
TOTAL PROTEIN: 6.9 g/dL (ref 6.0–8.5)

## 2018-01-15 LAB — TSH: TSH: 1.61 u[IU]/mL (ref 0.450–4.500)

## 2018-01-15 LAB — VITAMIN D 25 HYDROXY (VIT D DEFICIENCY, FRACTURES): VIT D 25 HYDROXY: 35.5 ng/mL (ref 30.0–100.0)

## 2018-01-15 LAB — LIPID PANEL
CHOL/HDL RATIO: 3 ratio (ref 0.0–5.0)
Cholesterol, Total: 127 mg/dL (ref 100–199)
HDL: 42 mg/dL (ref >39)
LDL Calculated: 59 mg/dL (ref 0–99)
Triglycerides: 130 mg/dL (ref 0–149)
VLDL CHOLESTEROL CAL: 26 mg/dL (ref 5–40)

## 2018-01-28 DIAGNOSIS — F429 Obsessive-compulsive disorder, unspecified: Secondary | ICD-10-CM | POA: Diagnosis not present

## 2018-02-09 NOTE — Progress Notes (Signed)
Chief Complaint  Patient presents with  . Hypertension    4 week follow up on blood pressure. Brought in log and numbers are essentially the same per patient.     Patient presents for 1 month follow-up on his hypertension.  It was noted to be high at his physical. We discussed daily exercise, low sodium diet, and encouraged regular monitoring.  He brings his list to follow-up today. BP's are running 154-171/78-88.  Mostly 155-160/low 80's.  Exercising 3 days/week, trying to limit salt in the diet.  Denies headaches, dizziness.  Does note a mild cough when laying down, starting just recently. He has had some reflux recently, not too frequent.  Compliant with taking pantoprazole. He thinks related to more caffeine--just switched to decaf.  PMH, PSH, SH reviewed  Current Outpatient Medications on File Prior to Visit  Medication Sig Dispense Refill  . aspirin 81 MG tablet Take 81 mg by mouth daily.      Marland Kitchen atorvastatin (LIPITOR) 40 MG tablet Take 1 tablet (40 mg total) by mouth daily. 90 tablet 3  . carvedilol (COREG) 3.125 MG tablet TAKE 1 TABLET (3.125 MG TOTAL) BY MOUTH 2 (TWO) TIMES DAILY. 60 tablet 11  . cholecalciferol (VITAMIN D) 1000 units tablet Take 1,000 Units by mouth daily.    . citalopram (CELEXA) 20 MG tablet Take 1 tablet (20 mg total) by mouth daily. 90 tablet 3  . diphenhydrAMINE (BENADRYL) 25 MG tablet Take 50 mg by mouth every evening.    . fish oil-omega-3 fatty acids 1000 MG capsule Take 1 g by mouth daily.      . pantoprazole (PROTONIX) 40 MG tablet TAKE 1 TABLET BY MOUTH EVERY DAY 30 tablet 11  . sildenafil (REVATIO) 20 MG tablet Take 20 mg by mouth as needed.    . triamcinolone cream (KENALOG) 0.1 % Apply sparingly to affected areas of skin twice daily for up to 2 weeks. (Patient not taking: Reported on 02/11/2018) 80 g 1   No current facility-administered medications on file prior to visit.    Allergies  Allergen Reactions  . Imdur [Isosorbide Mononitrate] Rash    ROS: no fever, chills, URI symptoms, chest pain, palpitations, shortness of breath. Energy is good.  No headache, dizziness.  Some reflux and slight cough when laying down, per HPI.  No nausea, vomiting, bowel changes, no bleeding, bruising, rash, edema; moods are stable  PHYSICAL EXAM:  BP (!) 152/90   Pulse 64   Ht 5\' 11"  (1.803 m)   Wt 212 lb 3.2 oz (96.3 kg)   BMI 29.60 kg/m   Wt Readings from Last 3 Encounters:  02/11/18 212 lb 3.2 oz (96.3 kg)  01/14/18 214 lb 12.8 oz (97.4 kg)  06/15/17 211 lb 3.2 oz (95.8 kg)   Well appearing, pleasant male in no distress HEENT: conjunctiva and sclera are clear, EOMI Neck: no lymphadenopathy or mass Heart: regular rate and rhythm, no murmur Lungs: clear bilaterally Abdomen: soft, nontender, no mass.  nontender in epigastrium Extremities: no edema Psych: normal mood, affect, hygiene and grooming, in good spirits Neuro: alert and oriented, cranial nerves intact, normal gait.   ASSESSMENT/PLAN:  Essential hypertension - not controlled with carvedilol along (and pt didn't tolerate higher dose). Discussed ACEI, ARB. Start with Lisinopril 10mg . Aware to contact us if SE. f/u 1 mo - Plan: lisinopril (PRINIVIL,ZESTRIL) 10 MG tablet   Start lisinopril 10mg  once daily, in the morning. If your blood pressure drops too low, consistently (ie under 100-110/50-60 with dizziness)  then start cutting the tablets in half.  Continue to monitor the blood pressure and bring your list.  Continue regular exercise, low sodium diet, and work on weight loss.  Sending to Dr. Marlou Porch as Juluis Rainier, mutual patient. (He suggested ACEI on his last note if BP's remained high.)

## 2018-02-11 ENCOUNTER — Ambulatory Visit (INDEPENDENT_AMBULATORY_CARE_PROVIDER_SITE_OTHER): Payer: PPO | Admitting: Family Medicine

## 2018-02-11 ENCOUNTER — Encounter: Payer: Self-pay | Admitting: Family Medicine

## 2018-02-11 VITALS — BP 152/90 | HR 64 | Ht 71.0 in | Wt 212.2 lb

## 2018-02-11 DIAGNOSIS — I10 Essential (primary) hypertension: Secondary | ICD-10-CM | POA: Diagnosis not present

## 2018-02-11 MED ORDER — LISINOPRIL 10 MG PO TABS: 10.0000 mg | ORAL_TABLET | Freq: Every day | ORAL | 0 refills | Status: DC

## 2018-02-11 NOTE — Patient Instructions (Signed)
  Start lisinopril 10mg  once daily, in the morning. If your blood pressure drops too low, consistently (ie under 100-110/50-60 with dizziness) then start cutting the tablets in half.  Continue to monitor the blood pressure and bring your list.  Continue regular exercise, low sodium diet, and work on weight loss.

## 2018-02-12 DIAGNOSIS — F429 Obsessive-compulsive disorder, unspecified: Secondary | ICD-10-CM | POA: Diagnosis not present

## 2018-03-08 ENCOUNTER — Other Ambulatory Visit: Payer: Self-pay | Admitting: Family Medicine

## 2018-03-08 DIAGNOSIS — I10 Essential (primary) hypertension: Secondary | ICD-10-CM

## 2018-03-09 NOTE — Progress Notes (Signed)
Chief Complaint  Patient presents with  . Hypertension    1 month follow up on blood pressure, patient has list with him today.    Patient presents for 1 month follow up on hypertension. His BP was not controlled with carvedilol alone (and pt didn't tolerate higher dose).  Lisinopril 76m was added to his regimen. BP's have been running 135-154/67-82.  It was 145/68 earlier this morning. Appears to be running about 10 points lower than prior to starting lisinopril. Denies cough (had slight cough prior to starting, which resolved), side effects. He felt tired when he was taking it in the morning, switched to evening dosing, and energy is better. Denies headaches, chest pain, palpitations, shortness of breath, edema  Daughter has BAP-1 gene. It was recommended that her family be tested. (he states this gene/mutation prevents body from repairing damage to cells)  PMH, PSH, SH reviewed  Outpatient Encounter Medications as of 03/11/2018  Medication Sig  . aspirin 81 MG tablet Take 81 mg by mouth daily.    .Marland Kitchenatorvastatin (LIPITOR) 40 MG tablet Take 1 tablet (40 mg total) by mouth daily.  . carvedilol (COREG) 3.125 MG tablet TAKE 1 TABLET (3.125 MG TOTAL) BY MOUTH 2 (TWO) TIMES DAILY. Please keep upcoming appt for future refills. Thank you  . cholecalciferol (VITAMIN D) 1000 units tablet Take 1,000 Units by mouth daily.  . citalopram (CELEXA) 20 MG tablet Take 1 tablet (20 mg total) by mouth daily.  . diphenhydrAMINE (BENADRYL) 25 MG tablet Take 50 mg by mouth every evening.  . fish oil-omega-3 fatty acids 1000 MG capsule Take 1 g by mouth daily.    .Marland Kitchenlisinopril (PRINIVIL,ZESTRIL) 20 MG tablet Take 1 tablet (20 mg total) by mouth daily.  . pantoprazole (PROTONIX) 40 MG tablet Take 1 tablet (40 mg total) by mouth daily. Please keep upcoming appt for future refills. Thank you  . triamcinolone cream (KENALOG) 0.1 % Apply sparingly to affected areas of skin twice daily for up to 2 weeks.  . vardenafil  (LEVITRA) 20 MG tablet Take 20 mg by mouth as needed.  . [DISCONTINUED] lisinopril (PRINIVIL,ZESTRIL) 10 MG tablet TAKE 1 TABLET BY MOUTH EVERY DAY  . [DISCONTINUED] carvedilol (COREG) 3.125 MG tablet TAKE 1 TABLET (3.125 MG TOTAL) BY MOUTH 2 (TWO) TIMES DAILY.  . [DISCONTINUED] pantoprazole (PROTONIX) 40 MG tablet TAKE 1 TABLET BY MOUTH EVERY DAY  . [DISCONTINUED] sildenafil (REVATIO) 20 MG tablet Take 20 mg by mouth as needed.   No facility-administered encounter medications on file as of 03/11/2018.    (taking lisinopril 16mprior today's visit)  Allergies  Allergen Reactions  . Imdur [Isosorbide Mononitrate] Rash    ROS: no fever, chills, headaches, dizziness, URI symptoms, cough, shortness of breath, edema, GI or GU complaints, moods are good.   PHYSICAL EXAM:  BP 130/80   Pulse (!) 56   Ht '5\' 11"'  (1.803 m)   Wt 213 lb 3.2 oz (96.7 kg)   BMI 29.74 kg/m   144/88 on repeat by MD  Well appearing, pleasant male in no distress HEENT: conjunctiva and sclera are clear, EOMI Neck: no lymphadenopathy or mass Heart: regular rate and rhythm, no murmur Lungs: clear bilaterally Abdomen: soft, nontender, no mass.  Extremities: no edema Psych: normal mood, affect, hygiene and grooming, in good spirits Neuro: alert and oriented, cranial nerves intact, normal gait.   ASSESSMENT/PLAN:   Had about 10 point decrease in BP on the 1057misinopril.  Increase to 81m2[place]urn in 4-6 weeks.  b-met at f/u.   Your blood pressure improved some with the new medication, but is not yet low enough (goal is <130/80). Double up on your 20m tablets until you finish them. I'm sending a new prescription for the 259mtablet to your pharmacy.  We will be checking a blood test when you return. You don't need to fast, unless you want to know what your fasting sugar is (the sugar is on the panel that is being checked).    REFER BOTH PT AND WIFE DIANE FOR GENETIC TESTING

## 2018-03-10 ENCOUNTER — Other Ambulatory Visit: Payer: Self-pay | Admitting: Cardiology

## 2018-03-10 MED ORDER — CARVEDILOL 3.125 MG PO TABS: ORAL_TABLET | ORAL | 0 refills | Status: DC

## 2018-03-10 MED ORDER — PANTOPRAZOLE SODIUM 40 MG PO TBEC: 40.0000 mg | DELAYED_RELEASE_TABLET | Freq: Every day | ORAL | 0 refills | Status: DC

## 2018-03-11 ENCOUNTER — Ambulatory Visit (INDEPENDENT_AMBULATORY_CARE_PROVIDER_SITE_OTHER): Payer: PPO | Admitting: Family Medicine

## 2018-03-11 ENCOUNTER — Encounter: Payer: Self-pay | Admitting: Family Medicine

## 2018-03-11 VITALS — BP 130/80 | HR 56 | Ht 71.0 in | Wt 213.2 lb

## 2018-03-11 DIAGNOSIS — Z5181 Encounter for therapeutic drug level monitoring: Secondary | ICD-10-CM

## 2018-03-11 DIAGNOSIS — I10 Essential (primary) hypertension: Secondary | ICD-10-CM | POA: Diagnosis not present

## 2018-03-11 DIAGNOSIS — Z1379 Encounter for other screening for genetic and chromosomal anomalies: Secondary | ICD-10-CM

## 2018-03-11 DIAGNOSIS — Z8481 Family history of carrier of genetic disease: Secondary | ICD-10-CM

## 2018-03-11 MED ORDER — LISINOPRIL 20 MG PO TABS: 20.0000 mg | ORAL_TABLET | Freq: Every day | ORAL | 1 refills | Status: DC

## 2018-03-11 NOTE — Patient Instructions (Addendum)
  Your blood pressure improved some with the new medication, but is not yet low enough (goal is <130/80). Double up on your 10mg  tablets until you finish them. I'm sending a new prescription for the 20mg  tablet to your pharmacy.  We will be checking a blood test when you return. You don't need to fast, unless you want to know what your fasting sugar is (the sugar is on the panel that is being checked).   We are going to refer you and your wife for genetics testing (through the cancer center).

## 2018-03-12 DIAGNOSIS — F429 Obsessive-compulsive disorder, unspecified: Secondary | ICD-10-CM | POA: Diagnosis not present

## 2018-03-30 ENCOUNTER — Other Ambulatory Visit: Payer: Self-pay | Admitting: Family Medicine

## 2018-03-30 DIAGNOSIS — I10 Essential (primary) hypertension: Secondary | ICD-10-CM

## 2018-04-02 ENCOUNTER — Other Ambulatory Visit: Payer: Self-pay | Admitting: Family Medicine

## 2018-04-02 DIAGNOSIS — I10 Essential (primary) hypertension: Secondary | ICD-10-CM

## 2018-04-14 DIAGNOSIS — F429 Obsessive-compulsive disorder, unspecified: Secondary | ICD-10-CM | POA: Diagnosis not present

## 2018-04-23 DIAGNOSIS — Z8042 Family history of malignant neoplasm of prostate: Secondary | ICD-10-CM | POA: Diagnosis not present

## 2018-04-23 DIAGNOSIS — Z8546 Personal history of malignant neoplasm of prostate: Secondary | ICD-10-CM | POA: Diagnosis not present

## 2018-04-23 DIAGNOSIS — Z808 Family history of malignant neoplasm of other organs or systems: Secondary | ICD-10-CM | POA: Diagnosis not present

## 2018-04-28 NOTE — Progress Notes (Signed)
Chief Complaint  Patient presents with  . Hypertension    Patient presents for 6 week f/u on hypertension. His lisinopril dose was increased from 10 to 20mg  at last visit, due to inadequate control (though did see some improvement with the addition of the 10mg  lisinopril to his carvedilol)  BP's have been running 129/70-161/87, mainly 140's/75. It was 135/74 yesterday after playing pickleball.  Denies cough (had slight cough prior to starting, which resolved, and now has a slight cough again from sick contact), or other side effects. He takes it in the evening (had felt tired when taking the 10mg  dose in the morning.) Denies headaches, chest pain, palpitations, shortness of breath, edema  Daughter is in ICU at [facility], just extubated today. He did not bring this up, but I asked how she was doing--clearly has some stressors currently.  Referred for genetic testing at last visit--they don't do here in [place] Had tests done last week at [facility].  PMH, PSH, SH reviewed  Outpatient Encounter Medications as of 04/29/2018  Medication Sig  . aspirin 81 MG tablet Take 81 mg by mouth daily.    Marland Kitchen atorvastatin (LIPITOR) 40 MG tablet Take 1 tablet (40 mg total) by mouth daily.  . carvedilol (COREG) 3.125 MG tablet TAKE 1 TABLET (3.125 MG TOTAL) BY MOUTH 2 (TWO) TIMES DAILY. Please keep upcoming appt for future refills. Thank you  . cholecalciferol (VITAMIN D) 1000 units tablet Take 1,000 Units by mouth daily.  . citalopram (CELEXA) 20 MG tablet Take 1 tablet (20 mg total) by mouth daily.  . diphenhydrAMINE (BENADRYL) 25 MG tablet Take 50 mg by mouth every evening.  . fish oil-omega-3 fatty acids 1000 MG capsule Take 1 g by mouth daily.    Marland Kitchen lisinopril (PRINIVIL,ZESTRIL) 20 MG tablet TAKE 1 TABLET BY MOUTH EVERY DAY  . pantoprazole (PROTONIX) 40 MG tablet Take 1 tablet (40 mg total) by mouth daily. Please keep upcoming appt for future refills. Thank you  . triamcinolone cream (KENALOG) 0.1 % Apply sparingly to  affected areas of skin twice daily for up to 2 weeks.  . vardenafil (LEVITRA) 20 MG tablet Take 20 mg by mouth as needed.  . [DISCONTINUED] lisinopril (PRINIVIL,ZESTRIL) 10 MG tablet TAKE 1 TABLET BY MOUTH EVERY DAY   No facility-administered encounter medications on file as of 04/29/2018.    Allergies  Allergen Reactions  . Imdur [Isosorbide Mononitrate] Rash   ROS: no headaches, dizziness, fever, chills, dizziness, syncope, chest pain, shortness of breath, edema, bleeding.  Slight URI symptoms/cold, resolving.  PHYSICAL EXAM:  BP 130/88   Pulse 60   Ht 5\' 10"  (1.778 m)   Wt 206 lb 6.4 oz (93.6 kg)   BMI 29.62 kg/m    142/84 on repeat by MD  Wt Readings from Last 3 Encounters:  03/11/18 213 lb 3.2 oz (96.7 kg)  02/11/18 212 lb 3.2 oz (96.3 kg)  01/14/18 214 lb 12.8 oz (97.4 kg)   Well appearing, pleasant male in no distress HEENT: conjunctiva and sclera are clear, EOMI Neck: no lymphadenopathy or mass Heart: regular rate and rhythm, no murmur Lungs: clear bilaterally Extremities: no edema Psych: normal mood, affect, hygiene and grooming Neuro: alert and oriented, cranial nerves intact, normal gait.   ASSESSMENT/PLAN:  Essential hypertension - borderline. Given stressors and that he has cardiology appt within the week, no changes for now. Cont low Na diet, regular exercise. Some wt loss rec - Plan: Basic metabolic panel  Medication monitoring encounter - Plan: Basic metabolic  panel   CPE scheduled for Jan 2020 Will have him return in 3 mos for f/u on BP's  Counseling encouraged for him and his wife (the rest of the daughter's family, who lives with them, are already getting).   Blood pressure remains borderline, though improved.  Seems higher systolic numbers at home than in our office, but diastolics are a little higher here than at home. Continue low sodium diet, try and exercise daily, continue to monitor. If you see persistently high numbers, don't wait until  August. We will have Dr. Marlou Porch weigh in his opinion.  We may need to consider adding a diuretic such as HCTZ if BP's remain above goal (unless Dr. Marlou Porch has a different preference).

## 2018-04-29 ENCOUNTER — Encounter: Payer: Self-pay | Admitting: Family Medicine

## 2018-04-29 ENCOUNTER — Ambulatory Visit (INDEPENDENT_AMBULATORY_CARE_PROVIDER_SITE_OTHER): Payer: PPO | Admitting: Family Medicine

## 2018-04-29 VITALS — BP 130/88 | HR 60 | Ht 70.0 in | Wt 206.4 lb

## 2018-04-29 DIAGNOSIS — I10 Essential (primary) hypertension: Secondary | ICD-10-CM

## 2018-04-29 DIAGNOSIS — Z5181 Encounter for therapeutic drug level monitoring: Secondary | ICD-10-CM | POA: Diagnosis not present

## 2018-04-29 NOTE — Patient Instructions (Addendum)
  Blood pressure remains borderline, though improved.  Seems higher systolic numbers at home than in our office, but diastolics are a little higher here than at home. Continue low sodium diet, try and exercise daily, continue to monitor. If you see persistently high numbers, don't wait until August. We will have Dr. Marlou Porch weigh in his opinion.  We may need to consider adding a diuretic such as HCTZ if BP's remain above goal (unless Dr. Marlou Porch has a different preference).   I recommend looking into counseling for you and your wife.

## 2018-04-30 ENCOUNTER — Encounter: Payer: Self-pay | Admitting: Family Medicine

## 2018-04-30 ENCOUNTER — Other Ambulatory Visit: Payer: Self-pay | Admitting: Family Medicine

## 2018-04-30 DIAGNOSIS — I10 Essential (primary) hypertension: Secondary | ICD-10-CM

## 2018-04-30 LAB — BASIC METABOLIC PANEL
BUN / CREAT RATIO: 12 (ref 10–24)
BUN: 15 mg/dL (ref 8–27)
CO2: 21 mmol/L (ref 20–29)
Calcium: 9.2 mg/dL (ref 8.6–10.2)
Chloride: 105 mmol/L (ref 96–106)
Creatinine, Ser: 1.25 mg/dL (ref 0.76–1.27)
GFR, EST AFRICAN AMERICAN: 68 mL/min/{1.73_m2} (ref >59)
GFR, EST NON AFRICAN AMERICAN: 59 mL/min/{1.73_m2} — AB (ref >59)
Glucose: 85 mg/dL (ref 65–99)
POTASSIUM: 4.7 mmol/L (ref 3.5–5.2)
Sodium: 141 mmol/L (ref 134–144)

## 2018-05-06 ENCOUNTER — Ambulatory Visit: Payer: PPO | Admitting: Cardiology

## 2018-05-06 ENCOUNTER — Other Ambulatory Visit: Payer: Self-pay | Admitting: Cardiology

## 2018-05-06 ENCOUNTER — Encounter: Payer: Self-pay | Admitting: Cardiology

## 2018-05-06 VITALS — BP 110/70 | HR 61 | Ht 71.0 in | Wt 209.2 lb

## 2018-05-06 DIAGNOSIS — I2583 Coronary atherosclerosis due to lipid rich plaque: Secondary | ICD-10-CM | POA: Diagnosis not present

## 2018-05-06 DIAGNOSIS — I10 Essential (primary) hypertension: Secondary | ICD-10-CM | POA: Diagnosis not present

## 2018-05-06 DIAGNOSIS — E7800 Pure hypercholesterolemia, unspecified: Secondary | ICD-10-CM | POA: Diagnosis not present

## 2018-05-06 DIAGNOSIS — I2510 Atherosclerotic heart disease of native coronary artery without angina pectoris: Secondary | ICD-10-CM | POA: Diagnosis not present

## 2018-05-06 MED ORDER — LISINOPRIL 20 MG PO TABS: 20.0000 mg | ORAL_TABLET | Freq: Every day | ORAL | 3 refills | Status: DC

## 2018-05-06 MED ORDER — ATORVASTATIN CALCIUM 40 MG PO TABS: 40.0000 mg | ORAL_TABLET | Freq: Every day | ORAL | 3 refills | Status: DC

## 2018-05-06 NOTE — Progress Notes (Signed)
[facility] [address], [address] [place], [place]  [address] Phone: [phone] Fax:  [phone]  Date:  05/06/2018   ID:  [name], DOB 18-Apr-1951, MRN [id number]  PCP:  Rita Ohara, MD   History of Present Illness: [name] is a 67 y.o. male with coronary artery disease status post DES to LAD and 06/2009 as well as DES to LAD 05/23/14 distal to previously placed stent with hypertension, hyperlipidemia, anxiety here for followup.  Previously had mild chest discomfort with exercise but this is infrequent. He goes to the [facility] 3 days a week and exercises with a target heart rate of about 135 bpm. He had a kidney stone that has resolved.   Celexa working well for him. Previously participated in the Cardiac maintenance program. He wishes to be off of medications if possible. Beta blocker, carvedilol 3.125 twice a day was added in September of 2015 as a trial. His blood pressures are better controlled. At home usually 572 systolic. overall he is doing well.  04/28/17-complaining of some swelling in his feet which has been chronic. Otherwise he is doing quite well. He is put on some weight. No chest pain, no shortness of breath, no syncope, no bleeding. Overall doing quite well.  05/06/2018-overall doing quite well.  No changes in symptoms.  No shortness of breath, no chest pain, no fevers.  Showed me some blood pressure readings from home.  Studies:  - LHC (05/23/14): LAD proximal to previously placed stent 30%, mid LAD distal to the stent 99%, CFX and RCA No CAD EF 50% with ant HK. PCI: 18 x 2.5 mm diameter Xience Alpine DES to mid LAD.  - Nuclear (04/26/14): High risk stress nuclear study . There is a small but severe area of ischemia in the distal anterior wall. EF 46%. Ant HK.   Recent Labs:  11/03/2013: ALT 37; HDL Cholesterol by NMR 41; LDL (calc) 51  05/25/2014: Creatinine 1.02; Hemoglobin 14.1; Potassium 4.6       Wt Readings from Last 3 Encounters:  05/06/18 209 lb  3.2 oz (94.9 kg)  04/29/18 206 lb 6.4 oz (93.6 kg)  03/11/18 213 lb 3.2 oz (96.7 kg)     Past Medical History:  Diagnosis Date  . CAD (coronary artery disease) 2010, 2015 stents   a. s/p DES to LAD 06/2009. b. LHC (05/23/14): LAD proximal to previously placed stent  30%, mid LAD distal to the stent 99%, CFX and RCA No CAD EF 50% with ant HK. PCI:  18 x 2.5 mm diameter Xience Alpine DES to mid LAD.  Marland Kitchen Colon polyps 10/2015   tubular adeomas x 4  . Diverticulosis 10/2015   noted on colonoscopy  . ED (erectile dysfunction)   . GERD (gastroesophageal reflux disease)   . Hyperlipidemia   . Hypertension   . Inguinal hernia, left 08/2014   seen on CT (containing peritoneal fat)  . Internal hemorrhoids 10/2015   noted on colonoscopy  . Irregular heartbeat    started on beta blocker by Dr. Marlou Porch, improved  . Kidney stone 03/2010   08/2014-distal R ureter  . Myocardial infarction ([place])    2010  . Prostate cancer ([place]) 01/2008    s/p brachiotherapy; Dr. Karsten Ro  . Shingles 2010    Past Surgical History:  Procedure Laterality Date  . CARDIAC CATHETERIZATION  05/23/2014  . COLONOSCOPY  2005, 10/2015  . CORONARY ANGIOPLASTY WITH STENT PLACEMENT  06/2009   "1"  .  INGUINAL HERNIA REPAIR Left 10/1998  . LEFT HEART CATHETERIZATION WITH CORONARY ANGIOGRAM N/A 05/23/2014   Procedure: LEFT HEART CATHETERIZATION WITH CORONARY ANGIOGRAM;  Surgeon: Candee Furbish, MD;  Location: [facility] C[facility] LAB;  Service: Cardiovascular;  Laterality: N/A;  . PERCUTANEOUS CORONARY STENT INTERVENTION (PCI-S) N/A 05/24/2014   Procedure: PERCUTANEOUS CORONARY STENT INTERVENTION (PCI-S);  Surgeon: Sinclair Grooms, MD;  Location: [facility] CATH LAB;  Service: Cardiovascular;  Laterality: N/A;  . RADIOACTIVE SEED IMPLANT  01/2008   prostate cancer  . VASECTOMY      Current Outpatient Medications  Medication Sig Dispense Refill  . aspirin 81 MG tablet Take 81 mg by mouth daily.      Marland Kitchen atorvastatin (LIPITOR) 40 MG tablet TAKE 1 TABLET BY  MOUTH EVERY DAY 90 tablet 3  . carvedilol (COREG) 3.125 MG tablet TAKE 1 TABLET (3.125 MG TOTAL) BY MOUTH 2 (TWO) TIMES DAILY. Please keep upcoming appt for future refills. Thank you 180 tablet 0  . cholecalciferol (VITAMIN D) 1000 units tablet Take 1,000 Units by mouth daily.    . citalopram (CELEXA) 20 MG tablet Take 1 tablet (20 mg total) by mouth daily. 90 tablet 3  . diphenhydrAMINE (BENADRYL) 25 MG tablet Take 50 mg by mouth every evening.    . fish oil-omega-3 fatty acids 1000 MG capsule Take 1 g by mouth daily.      Marland Kitchen lisinopril (PRINIVIL,ZESTRIL) 20 MG tablet TAKE 1 TABLET BY MOUTH EVERY DAY 30 tablet 0  . pantoprazole (PROTONIX) 40 MG tablet Take 1 tablet (40 mg total) by mouth daily. Please keep upcoming appt for future refills. Thank you 90 tablet 0  . triamcinolone cream (KENALOG) 0.1 % Apply sparingly to affected areas of skin twice daily for up to 2 weeks. 80 g 1  . vardenafil (LEVITRA) 20 MG tablet Take 20 mg by mouth as needed.  3   No current facility-administered medications for this visit.     Allergies:    Allergies  Allergen Reactions  . Imdur [Isosorbide Mononitrate] Rash    Social History:  The patient  reports that he quit smoking about 41 years ago. His smoking use included cigarettes, pipe, and cigars. He has never used smokeless tobacco. He reports that he drinks about 3.0 oz of alcohol per week. He reports that he does not use drugs.   Works at [employer] History  Problem Relation Age of Onset  . Heart disease Father 46  . Prostate cancer Father 69  . Hypertension Father   . Hyperlipidemia Father   . Eating disorder Mother   . Bladder Cancer Mother        and peritoneal cancer  . Hodgkin's lymphoma Daughter   . Cardiomyopathy Daughter        related to treatment for lymphoma  . Thyroid cancer Daughter   . Heart disease Daughter        congenital ASD, s/p repair  . Cancer Daughter        Hodkin's; thyroid CA; lymphoma; mesothelioma 10/2015  .  Mesothelioma Daughter   . Heart disease Brother        arrhythmia  . Prostate cancer Paternal Grandfather   . Diabetes Neg Hx   . Colon cancer Neg Hx   . Colon polyps Neg Hx   . Esophageal cancer Neg Hx   . Rectal cancer Neg Hx   . Stomach cancer Neg Hx     ROS: All other review of systems negative  PHYSICAL EXAM: VS:  BP 110/70   Pulse 61   Ht 5\' 11"  (1.803 m)   Wt 209 lb 3.2 oz (94.9 kg)   BMI 29.18 kg/m  GEN: Well nourished, well developed, in no acute distress  HEENT: normal  Neck: no JVD, carotid bruits, or masses Cardiac: RRR; no murmurs, rubs, or gallops,no edema  Respiratory:  clear to auscultation bilaterally, normal work of breathing GI: soft, nontender, nondistended, + BS MS: no deformity or atrophy  Skin: warm and dry, no rash Neuro:  Alert and Oriented x 3, Strength and sensation are intact Psych: euthymic mood, full affect    EKG:  EKG was ordered today.  05/06/2018-61 normal sinus rhythm no other changes.  Personally viewed. 04/28/17-sinus rhythm 62 with no other abnormalities personally viewed- 04/29/16-sinus rhythm, 60, PAC, no other abnormalities personally viewed.     ASSESSMENT AND PLAN:   Coronary artery disease  - Prior LAD stents placed, drug-eluting both in 2010 as well as 05/24/14.  - In the past he has felt mild chest discomfort at times during exercise. Doing well now.   - Continuing with medication.  -No anginal symptoms.  Doing quite well.  Encouraged him to lose approximately 10 pounds.  Essential hypertension  - Has been labile in the past.  - Takes low-dose carvedilol. Has had trouble with fatigue with higher dosing.  -Taking lisinopril.  I rechecked his blood pressure and it was 124/80.  He showed me readings at home and they are in the 140s sometimes 160s.  He has a manual cuff.  I encouraged him to get an electronic cuff.  Hyperlipidemia  - Atorvastatin 40 mg. LDL at goal less than 70.  LDL 59.  - No myalgias.  One year  f/u  Signed, Candee Furbish, MD [facility]  05/06/2018 2:39 PM

## 2018-05-06 NOTE — Patient Instructions (Signed)

## 2018-05-14 DIAGNOSIS — F429 Obsessive-compulsive disorder, unspecified: Secondary | ICD-10-CM | POA: Diagnosis not present

## 2018-06-09 DIAGNOSIS — F429 Obsessive-compulsive disorder, unspecified: Secondary | ICD-10-CM | POA: Diagnosis not present

## 2018-06-10 ENCOUNTER — Other Ambulatory Visit: Payer: Self-pay | Admitting: Cardiology

## 2018-07-13 DIAGNOSIS — F429 Obsessive-compulsive disorder, unspecified: Secondary | ICD-10-CM | POA: Diagnosis not present

## 2018-07-31 NOTE — Progress Notes (Signed)
Chief Complaint  Patient presents with  . med check    med check    Patient presents for 6 month med check, last seen 3 mos ago for BP f/u.  HTN: He saw Dr. Marlou Porch shortly after our last visit here. BP was lower in his office.  No medication changes were made, but it was suggested that he get an electronic monitor (had been using a manual one).  He has checked it at [facility], bought a new monitor (which he brought, [employer]) and is still seeing 140/80. He said his wife has been "on him" about his salt intake recently.  Denies headaches (rare, sometimes jaw pain causes headache, just occasionall getting jaw pain). He has noticed some increased dizziness just recently, when he gets up in the morning, stands quickly.He sees slight swelling in the feet/ankles (sock mark only, not worsening). Hasn't had any chest pain since the last stent was placed. No dyspnea on exertion. He tries to limit salt in his diet.  CAD: He had another DES placed 05/23/14 to LAD. He completed cardiac rehab and Plavix x 1 year. He continues to see Dr. Marlou Porch, without any new changes in medication. Last seen in May, 2019. He is active at the gym, and denies anginal.  GERD: His Nexium was changed to Protonixa couple of years ago(due to interaction with Plavix). Initially he had some heartburn after the change, but it has resolved. He is now off the Plavix, but the Protonix seems to work well, so continues on this med. Denies dysphagia. He has recurrent symptoms if he misses the medication for 2 days.  Hyperlipidemia: Patient is reportedly following a low-fat, low cholesterol diet. Compliant with medications and denies medication side effects. Last lipids were at goal. Lab Results  Component Value Date   CHOL 127 01/14/2018   HDL 42 01/14/2018   LDLCALC 59 01/14/2018   TRIG 130 01/14/2018   CHOLHDL 3.0 01/14/2018    Anxiety: Doing well on citalopram 20mg . Had some recurrent symptoms when titrated the dose down  further in the past.  His daughter past away shortly after his last visit (mesothelioma). She and her 2 kids and husband, and their [place] were living with them.  Son-in-law was just approved for a mortgage, hoping to move into their own home nearby.  He reports doing well overall.  He was at the beach this summer with his wife, saw other daughter, grandsons.  Vitamin D deficiency:He was treated in the past with prescription weekly replacement. Last check was in [address] and level was 35.5.  He continues on 1000 IU daily.   PMH, PSH, SH reviewed  Outpatient Encounter Medications as of 08/02/2018  Medication Sig  . aspirin 81 MG tablet Take 81 mg by mouth daily.    Marland Kitchen atorvastatin (LIPITOR) 40 MG tablet Take 1 tablet (40 mg total) by mouth daily.  . carvedilol (COREG) 3.125 MG tablet TAKE 1 TABLET TWICE A DAY (KEEP UPCOMING APPOINTMENT FOR FUTURE REFILLS)  . cholecalciferol (VITAMIN D) 1000 units tablet Take 1,000 Units by mouth daily.  . citalopram (CELEXA) 20 MG tablet Take 1 tablet (20 mg total) by mouth daily.  . diphenhydrAMINE (BENADRYL) 25 MG tablet Take 50 mg by mouth every evening.  . fish oil-omega-3 fatty acids 1000 MG capsule Take 1 g by mouth daily.    Marland Kitchen lisinopril (PRINIVIL,ZESTRIL) 20 MG tablet Take 1 tablet (20 mg total) by mouth daily.  . pantoprazole (PROTONIX) 40 MG tablet TAKE 1 TABLET (40 MG  TOTAL) BY MOUTH DAILY. PLEASE KEEP UPCOMING APPT FOR FUTURE REFILLS  . triamcinolone cream (KENALOG) 0.1 % Apply sparingly to affected areas of skin twice daily for up to 2 weeks.  . vardenafil (LEVITRA) 20 MG tablet Take 20 mg by mouth as needed.   No facility-administered encounter medications on file as of 08/02/2018.    Allergies  Allergen Reactions  . Imdur [Isosorbide Mononitrate] Rash    ROS: no headaches, fever, chills, dizziness, syncope, chest pain, shortness of breath, edema, bleeding. No URI symptoms, neuro complaints, joints pains, rash. Some increased dizziness in the  mornings, getting up.   PHYSICAL EXAM:  BP 140/90   Pulse (!) 53   Temp 97.8 F (36.6 C) (Oral)   Resp 16   Ht 5\' 11"  (1.803 m)   Wt 213 lb 12.8 oz (97 kg)   SpO2 96%   BMI 29.82 kg/m   Wt Readings from Last 3 Encounters:  08/02/18 213 lb 12.8 oz (97 kg)  05/06/18 209 lb 3.2 oz (94.9 kg)  04/29/18 206 lb 6.4 oz (93.6 kg)   137/73 with pt's monitor (resting on table, seated in chair), left arm 146/88 by MD on left (148 on the right) Repeat with pt's monitor (left): 143/72  Well appearing, pleasant male in no distress HEENT: conjunctiva and sclera are clear, EOMI, OP clear Neck: no lymphadenopathy or mass, no bruit Heart: regular rate and rhythm, no murmur Lungs: clear bilaterally Back: no CVA or spinal tenderness Abdomen: soft, nontender, no organomegaly or mass Extremities: no edema, normal pulses Psych: normal mood, affect, hygiene and grooming Neuro: alert and oriented, cranial nerves intact, normal gait. Skin: normal turgor, no rash   ASSESSMENT/PLAN:  Essential hypertension - borderline values, admittedly having more sodium lately, and gained wt. Encouraged low Na diet, daily exercise, weight loss. Cont current meds, monitoring  Coronary artery disease due to lipid rich plaque - stable, asymptomatic  Pure hypercholesterolemia - at goal on last check; continue current meds  Gastroesophageal reflux disease without esophagitis - well controlled  Vitamin D deficiency - continue daily vitamin  Anxiety disorder, unspecified type - doing well, continue citalopram. counseled briefly (regarding loss of daughter)   No labs needed today. Reminded to get Shingrix, from pharmacy  F/u at CPE already scheduled for 12/2018    Try and increase your fluid intake (water, or other non-caffeinated, non-alcoholic beverage), especially in this heat and humidity. If you aren't getting enough fluids, your dizziness can be worse. If your dizziness isn't improving, we can have  you return for some bloodwork (everything looked okay on last check, but worsening/persistent dizziness is a reason to recheck).  Your blood pressure was borderline high. Please work on cutting back the sodium in your diet, as well as losing weight (you regained some--and you tend to hold it in your stomach area) Consider healthier snacks--fruit (watermelon, apple slices), vegetables (carrot sticks), etc and read the labels for the sodium content.

## 2018-08-02 ENCOUNTER — Encounter: Payer: Self-pay | Admitting: Family Medicine

## 2018-08-02 ENCOUNTER — Ambulatory Visit (INDEPENDENT_AMBULATORY_CARE_PROVIDER_SITE_OTHER): Payer: PPO | Admitting: Family Medicine

## 2018-08-02 VITALS — BP 140/90 | HR 53 | Temp 97.8°F | Resp 16 | Ht 71.0 in | Wt 213.8 lb

## 2018-08-02 DIAGNOSIS — I10 Essential (primary) hypertension: Secondary | ICD-10-CM | POA: Diagnosis not present

## 2018-08-02 DIAGNOSIS — K219 Gastro-esophageal reflux disease without esophagitis: Secondary | ICD-10-CM | POA: Diagnosis not present

## 2018-08-02 DIAGNOSIS — I2583 Coronary atherosclerosis due to lipid rich plaque: Secondary | ICD-10-CM | POA: Diagnosis not present

## 2018-08-02 DIAGNOSIS — F419 Anxiety disorder, unspecified: Secondary | ICD-10-CM | POA: Diagnosis not present

## 2018-08-02 DIAGNOSIS — I2510 Atherosclerotic heart disease of native coronary artery without angina pectoris: Secondary | ICD-10-CM

## 2018-08-02 DIAGNOSIS — E559 Vitamin D deficiency, unspecified: Secondary | ICD-10-CM

## 2018-08-02 DIAGNOSIS — E7800 Pure hypercholesterolemia, unspecified: Secondary | ICD-10-CM

## 2018-08-02 NOTE — Patient Instructions (Addendum)
I recommend getting the new shingles vaccine (Shingrix). You will need to check with your insurance to see if it is covered, and if covered by Medicare Part D, you need to get from the pharmacy rather than our office.  It is a series of 2 injections, spaced 2 months apart.    Try and increase your fluid intake (water, or other non-caffeinated, non-alcoholic beverage), especially in this heat and humidity. If you aren't getting enough fluids, your dizziness can be worse. If your dizziness isn't improving, we can have you return for some bloodwork (everything looked okay on last check, but worsening/persistent dizziness is a reason to recheck).  Your blood pressure was borderline high. Please work on cutting back the sodium in your diet, as well as losing weight (you regained some--and you tend to hold it in your stomach area) Consider healthier snacks--fruit (watermelon, apple slices), vegetables (carrot sticks), etc and read the labels for the sodium content.     Low-Sodium Eating Plan Sodium, which is an element that makes up salt, helps you maintain a healthy balance of fluids in your body. Too much sodium can increase your blood pressure and cause fluid and waste to be held in your body. Your health care provider or dietitian may recommend following this plan if you have high blood pressure (hypertension), kidney disease, liver disease, or heart failure. Eating less sodium can help lower your blood pressure, reduce swelling, and protect your heart, liver, and kidneys. What are tips for following this plan? General guidelines  Most people on this plan should limit their sodium intake to 1,500-2,000 mg (milligrams) of sodium each day. Reading food labels  The Nutrition Facts label lists the amount of sodium in one serving of the food. If you eat more than one serving, you must multiply the listed amount of sodium by the number of servings.  Choose foods with less than 140 mg of sodium per  serving.  Avoid foods with 300 mg of sodium or more per serving. Shopping  Look for lower-sodium products, often labeled as "low-sodium" or "no salt added."  Always check the sodium content even if foods are labeled as "unsalted" or "no salt added".  Buy fresh foods. ? Avoid canned foods and premade or frozen meals. ? Avoid canned, cured, or processed meats  Buy breads that have less than 80 mg of sodium per slice. Cooking  Eat more home-cooked food and less restaurant, buffet, and fast food.  Avoid adding salt when cooking. Use salt-free seasonings or herbs instead of table salt or sea salt. Check with your health care provider or pharmacist before using salt substitutes.  Cook with plant-based oils, such as canola, sunflower, or olive oil. Meal planning  When eating at a restaurant, ask that your food be prepared with less salt or no salt, if possible.  Avoid foods that contain MSG (monosodium glutamate). MSG is sometimes added to [place] food, bouillon, and some canned foods. What foods are recommended? The items listed may not be a complete list. Talk with your dietitian about what dietary choices are best for you. Grains Low-sodium cereals, including oats, puffed wheat and rice, and shredded wheat. Low-sodium crackers. Unsalted rice. Unsalted pasta. Low-sodium bread. Whole-grain breads and whole-grain pasta. Vegetables Fresh or frozen vegetables. "No salt added" canned vegetables. "No salt added" tomato sauce and paste. Low-sodium or reduced-sodium tomato and vegetable juice. Fruits Fresh, frozen, or canned fruit. Fruit juice. Meats and other protein foods Fresh or frozen (no salt added) meat, poultry,  seafood, and fish. Low-sodium canned tuna and salmon. Unsalted nuts. Dried peas, beans, and lentils without added salt. Unsalted canned beans. Eggs. Unsalted nut butters. Dairy Milk. Soy milk. Cheese that is naturally low in sodium, such as ricotta cheese, fresh mozzarella, or  Swiss cheese Low-sodium or reduced-sodium cheese. Cream cheese. Yogurt. Fats and oils Unsalted butter. Unsalted margarine with no trans fat. Vegetable oils such as canola or olive oils. Seasonings and other foods Fresh and dried herbs and spices. Salt-free seasonings. Low-sodium mustard and ketchup. Sodium-free salad dressing. Sodium-free light mayonnaise. Fresh or refrigerated horseradish. Lemon juice. Vinegar. Homemade, reduced-sodium, or low-sodium soups. Unsalted popcorn and pretzels. Low-salt or salt-free chips. What foods are not recommended? The items listed may not be a complete list. Talk with your dietitian about what dietary choices are best for you. Grains Instant hot cereals. Bread stuffing, pancake, and biscuit mixes. Croutons. Seasoned rice or pasta mixes. Noodle soup cups. Boxed or frozen macaroni and cheese. Regular salted crackers. Self-rising flour. Vegetables Sauerkraut, pickled vegetables, and relishes. Olives. [place] fries. Onion rings. Regular canned vegetables (not low-sodium or reduced-sodium). Regular canned tomato sauce and paste (not low-sodium or reduced-sodium). Regular tomato and vegetable juice (not low-sodium or reduced-sodium). Frozen vegetables in sauces. Meats and other protein foods Meat or fish that is salted, canned, smoked, spiced, or pickled. Bacon, ham, sausage, hotdogs, corned beef, chipped beef, packaged lunch meats, salt pork, jerky, pickled herring, anchovies, regular canned tuna, sardines, salted nuts. Dairy Processed cheese and cheese spreads. Cheese curds. Blue cheese. Feta cheese. String cheese. Regular cottage cheese. Buttermilk. Canned milk. Fats and oils Salted butter. Regular margarine. Ghee. Bacon fat. Seasonings and other foods Onion salt, garlic salt, seasoned salt, table salt, and sea salt. Canned and packaged gravies. Worcestershire sauce. Tartar sauce. Barbecue sauce. Teriyaki sauce. Soy sauce, including reduced-sodium. Steak sauce. Fish  sauce. Oyster sauce. Cocktail sauce. Horseradish that you find on the shelf. Regular ketchup and mustard. Meat flavorings and tenderizers. Bouillon cubes. Hot sauce and Tabasco sauce. Premade or packaged marinades. Premade or packaged taco seasonings. Relishes. Regular salad dressings. Salsa. Potato and tortilla chips. Corn chips and puffs. Salted popcorn and pretzels. Canned or dried soups. Pizza. Frozen entrees and pot pies. Summary  Eating less sodium can help lower your blood pressure, reduce swelling, and protect your heart, liver, and kidneys.  Most people on this plan should limit their sodium intake to 1,500-2,000 mg (milligrams) of sodium each day.  Canned, boxed, and frozen foods are high in sodium. Restaurant foods, fast foods, and pizza are also very high in sodium. You also get sodium by adding salt to food.  Try to cook at home, eat more fresh fruits and vegetables, and eat less fast food, canned, processed, or prepared foods. This information is not intended to replace advice given to you by your health care provider. Make sure you discuss any questions you have with your health care provider. Document Released: 06/06/2002 Document Revised: 12/08/2016 Document Reviewed: 12/08/2016 Elsevier Interactive Patient Education  [employer].

## 2018-09-10 ENCOUNTER — Encounter: Payer: Self-pay | Admitting: Family Medicine

## 2018-11-02 ENCOUNTER — Encounter: Payer: Self-pay | Admitting: Internal Medicine

## 2018-11-04 DIAGNOSIS — C61 Malignant neoplasm of prostate: Secondary | ICD-10-CM | POA: Diagnosis not present

## 2018-11-10 DIAGNOSIS — R350 Frequency of micturition: Secondary | ICD-10-CM | POA: Diagnosis not present

## 2018-11-10 DIAGNOSIS — Z8546 Personal history of malignant neoplasm of prostate: Secondary | ICD-10-CM | POA: Diagnosis not present

## 2018-11-12 ENCOUNTER — Encounter: Payer: Self-pay | Admitting: Internal Medicine

## 2018-12-08 ENCOUNTER — Encounter: Payer: Self-pay | Admitting: Internal Medicine

## 2018-12-08 ENCOUNTER — Ambulatory Visit (AMBULATORY_SURGERY_CENTER): Payer: Self-pay | Admitting: *Deleted

## 2018-12-08 VITALS — Ht 70.0 in | Wt 208.0 lb

## 2018-12-08 DIAGNOSIS — Z86010 Personal history of colonic polyps: Secondary | ICD-10-CM

## 2018-12-08 MED ORDER — PEG 3350-KCL-NA BICARB-NACL 420 G PO SOLR: 4000.0000 mL | Freq: Once | ORAL | 0 refills | Status: AC

## 2018-12-08 NOTE — Progress Notes (Signed)
No egg or soy allergy known to patient  No issues with past sedation with any surgeries  or procedures, no intubation problems  No diet pills per patient No home 02 use per patient  No blood thinners per patient  Pt denies issues with constipation  No A fib or A flutter  EMMI video sent to pt's e mail pt declined   

## 2018-12-13 DIAGNOSIS — H903 Sensorineural hearing loss, bilateral: Secondary | ICD-10-CM | POA: Diagnosis not present

## 2018-12-15 ENCOUNTER — Encounter: Payer: Self-pay | Admitting: Family Medicine

## 2018-12-15 ENCOUNTER — Ambulatory Visit (INDEPENDENT_AMBULATORY_CARE_PROVIDER_SITE_OTHER): Payer: PPO | Admitting: Family Medicine

## 2018-12-15 VITALS — BP 114/64 | HR 64 | Ht 71.0 in | Wt 209.2 lb

## 2018-12-15 DIAGNOSIS — M76892 Other specified enthesopathies of left lower limb, excluding foot: Secondary | ICD-10-CM

## 2018-12-15 MED ORDER — NAPROXEN 500 MG PO TABS: 500.0000 mg | ORAL_TABLET | Freq: Two times a day (BID) | ORAL | 0 refills | Status: DC

## 2018-12-15 NOTE — Progress Notes (Signed)
Chief Complaint  Patient presents with  . Hip Pain    about 3 weeks ago started experiencing stiffness in the left hip and left knee that does not get any better as the day goes on. Takes ibuprofen at night and it does help some.     He was helping son-in-law move, was up and down the stairs a lot. Discomfort started in both hips shortly after.  Pain was at the groin, into the upper legs.  Had a lot of pain going from sitting to standing, and would take a few steps before feeling better. Also had pain at his left kneecap. This morning he feels better (much less stiff, and much less pain). Prior to today, he had a hard time lifting the left leg to get into the car, needed to use his arms to help lift the leg.  He has been taking ibuprofen 400 mg at bedtime, which helped, but was stiff again in the morning.    He also has some stiffness in his both shoulders, which has been improving.  PMH, PSH, SH reviewed  Outpatient Encounter Medications as of 12/15/2018  Medication Sig  . aspirin 81 MG tablet Take 81 mg by mouth daily.    Marland Kitchen atorvastatin (LIPITOR) 40 MG tablet Take 1 tablet (40 mg total) by mouth daily.  . carvedilol (COREG) 3.125 MG tablet TAKE 1 TABLET TWICE A DAY (KEEP UPCOMING APPOINTMENT FOR FUTURE REFILLS)  . cholecalciferol (VITAMIN D) 1000 units tablet Take 1,000 Units by mouth daily.  . citalopram (CELEXA) 20 MG tablet Take 1 tablet (20 mg total) by mouth daily.  . fish oil-omega-3 fatty acids 1000 MG capsule Take 1 g by mouth daily.    Marland Kitchen lisinopril (PRINIVIL,ZESTRIL) 20 MG tablet Take 1 tablet (20 mg total) by mouth daily.  . pantoprazole (PROTONIX) 40 MG tablet TAKE 1 TABLET (40 MG TOTAL) BY MOUTH DAILY. PLEASE KEEP UPCOMING APPT FOR FUTURE REFILLS  . triamcinolone cream (KENALOG) 0.1 % Apply sparingly to affected areas of skin twice daily for up to 2 weeks.  . vardenafil (LEVITRA) 20 MG tablet Take 20 mg by mouth as needed.  . diphenhydrAMINE (BENADRYL) 25 MG tablet Take 50 mg  by mouth every evening.  . naproxen (NAPROSYN) 500 MG tablet Take 1 tablet (500 mg total) by mouth 2 (two) times daily with a meal.   No facility-administered encounter medications on file as of 12/15/2018.    Allergies  Allergen Reactions  . Imdur [Isosorbide Mononitrate] Rash   ROS: No numbness, tingling or weakness.  Some stiffness in the back, no significant pain. No fever, chills, URI symptoms, chest pain, shortness of breath, GI or GU complaints, bleeding, bruising, rash or other complaints, except the pain/stiffness as noted in HPI.  PHYSICAL EXAM:  BP 114/64   Pulse 64   Ht 5\' 11"  (1.803 m)   Wt 209 lb 3.2 oz (94.9 kg)   BMI 29.18 kg/m   Wt Readings from Last 3 Encounters:  12/15/18 209 lb 3.2 oz (94.9 kg)  12/08/18 208 lb (94.3 kg)  08/02/18 213 lb 12.8 oz (97 kg)   Well appearing, pleasant male, in no distress Back: no spinal or CVA tenderness Extremities: nontender at ASIS, trochanteric bursa, along groin. No inguinal adenopathy. No inguinal hernia, and nontender in canal. FROM of both hips, without pain. Pain with left hip flexion against resistance Left Knee is normal--nontender, no swelling, warmth Neuro: normal strength, DTR's, sensation Normal gait, gets up from chair normally, without pain  or weakness   ASSESSMENT/PLAN:  Hip tendonitis, left - Plan: naproxen (NAPROSYN) 500 MG tablet  Suspect resolving patellar tendonitis as well. Reassured no evidence of hernia or hip arthritis based on exam. He is scheduled for colonoscopy tomorrow, cannot take NSAIDs today (and poss longer if polyps removed/biopsies done tomorrow). Shown stretches of hip flexors. NSAID risks/SE reviewed   Try moist heat, stretch. Avoid activities that exacerbate pain. You can try topical medications if needed today (icy hot, biofreeze, topical CBD oil) since you cannot take anti-inflammatories due to your upcoming colonoscopy. If they do a biopsy (polyp removal), they will tell you to  avoid anti-inflammatories for a certain period of time.  ASK THEM tomorrow if/when it is safe to start the anti-inflammatory (after the procedure).  Take the naproxen twice daily with food, regularly until you feel your pain has completely resolved (probably at least 5-7 days, you make take it up to a full 2 weeks, if needed.

## 2018-12-15 NOTE — Patient Instructions (Signed)
  Try moist heat, stretch. Avoid activities that exacerbate pain. You can try topical medications if needed today (icy hot, biofreeze, topical CBD oil) since you cannot take anti-inflammatories due to your upcoming colonoscopy. If they do a biopsy (polyp removal), they will tell you to avoid anti-inflammatories for a certain period of time.  ASK THEM tomorrow if/when it is safe to start the anti-inflammatory (after the procedure).  Take the naproxen twice daily with food, regularly until you feel your pain has completely resolved (probably at least 5-7 days, you make take it up to a full 2 weeks, if needed.

## 2018-12-16 ENCOUNTER — Encounter: Payer: Self-pay | Admitting: Internal Medicine

## 2018-12-16 ENCOUNTER — Ambulatory Visit (AMBULATORY_SURGERY_CENTER): Payer: PPO | Admitting: Internal Medicine

## 2018-12-16 VITALS — BP 152/88 | HR 64 | Temp 96.9°F | Resp 14 | Ht 70.0 in | Wt 208.0 lb

## 2018-12-16 DIAGNOSIS — Z86010 Personal history of colonic polyps: Secondary | ICD-10-CM | POA: Diagnosis not present

## 2018-12-16 DIAGNOSIS — Z1211 Encounter for screening for malignant neoplasm of colon: Secondary | ICD-10-CM | POA: Diagnosis not present

## 2018-12-16 MED ORDER — SODIUM CHLORIDE 0.9 % IV SOLN: 500.0000 mL | Freq: Once | INTRAVENOUS | Status: AC

## 2018-12-16 NOTE — Progress Notes (Signed)
A and O x3. Report to RN. Tolerated MAC anesthesia well.

## 2018-12-16 NOTE — Op Note (Signed)
[facility] Patient Name: Sage Kopera Procedure Date: 12/16/2018 8:36 AM MRN: [id number] Endoscopist: Jerene Bears , MD Age: 67 Referring MD:  Date of Birth: 19-Jul-1951 Gender: Male Account #: [id number] Procedure:                Colonoscopy Indications:              High risk colon cancer surveillance: Personal                            history of multiple (3 or more) adenomas, Last                            colonoscopy 3 years ago Medicines:                Monitored Anesthesia Care Procedure:                Pre-Anesthesia Assessment:                           - Prior to the procedure, a History and Physical                            was performed, and patient medications and                            allergies were reviewed. The patient's tolerance of                            previous anesthesia was also reviewed. The risks                            and benefits of the procedure and the sedation                            options and risks were discussed with the patient.                            All questions were answered, and informed consent                            was obtained. Prior Anticoagulants: The patient has                            taken no previous anticoagulant or antiplatelet                            agents. ASA Grade Assessment: II - A patient with                            mild systemic disease. After reviewing the risks                            and benefits, the patient was deemed in  satisfactory condition to undergo the procedure.                           After obtaining informed consent, the colonoscope                            was passed under direct vision. Throughout the                            procedure, the patient's blood pressure, pulse, and                            oxygen saturations were monitored continuously. The                            Colonoscope was introduced through the anus  and                            advanced to the cecum, identified by appendiceal                            orifice and ileocecal valve. The colonoscopy was                            performed without difficulty. The patient tolerated                            the procedure well. The quality of the bowel                            preparation was good. The ileocecal valve,                            appendiceal orifice, and rectum were photographed. Scope In: 8:40:34 AM Scope Out: 8:57:41 AM Scope Withdrawal Time: 0 hours 14 minutes 46 seconds  Total Procedure Duration: 0 hours 17 minutes 7 seconds  Findings:                 The digital rectal exam was normal.                           Multiple small and large-mouthed diverticula were                            found in the sigmoid colon and descending colon.                           Internal hemorrhoids were found during                            retroflexion. The hemorrhoids were small.                           The exam was otherwise without abnormality. Complications:  No immediate complications. Estimated Blood Loss:     Estimated blood loss: none. Impression:               - Diverticulosis in the sigmoid colon and in the                            descending colon.                           - Internal hemorrhoids.                           - The examination was otherwise normal.                           - No specimens collected. Recommendation:           - Patient has a contact number available for                            emergencies. The signs and symptoms of potential                            delayed complications were discussed with the                            patient. Return to normal activities tomorrow.                            Written discharge instructions were provided to the                            patient.                           - Resume previous diet.                           - Continue  present medications.                           - Repeat colonoscopy in 5 years for surveillance. Jerene Bears, MD 12/16/2018 9:00:21 AM This report has been signed electronically.

## 2018-12-16 NOTE — Patient Instructions (Signed)
YOU HAD AN ENDOSCOPIC PROCEDURE TODAY AT [facility]:   Refer to the procedure report that was given to you for any specific questions about what was found during the examination.  If the procedure report does not answer your questions, please call your gastroenterologist to clarify.  If you requested that your care partner not be given the details of your procedure findings, then the procedure report has been included in a sealed envelope for you to review at your convenience later.  YOU SHOULD EXPECT: Some feelings of bloating in the abdomen. Passage of more gas than usual.  Walking can help get rid of the air that was put into your GI tract during the procedure and reduce the bloating. If you had a lower endoscopy (such as a colonoscopy or flexible sigmoidoscopy) you may notice spotting of blood in your stool or on the toilet paper. If you underwent a bowel prep for your procedure, you may not have a normal bowel movement for a few days.  Please Note:  You might notice some irritation and congestion in your nose or some drainage.  This is from the oxygen used during your procedure.  There is no need for concern and it should clear up in a day or so.  SYMPTOMS TO REPORT IMMEDIATELY:   Following lower endoscopy (colonoscopy or flexible sigmoidoscopy):  Excessive amounts of blood in the stool  Significant tenderness or worsening of abdominal pains  Swelling of the abdomen that is new, acute  Fever of 100F or higher  For urgent or emergent issues, a gastroenterologist can be reached at any hour by calling [phone].   DIET:  We do recommend a small meal at first, but then you may proceed to your regular diet.  Drink plenty of fluids but you should avoid alcoholic beverages for 24 hours.  ACTIVITY:  You should plan to take it easy for the rest of today and you should NOT DRIVE or use heavy machinery until tomorrow (because of the sedation medicines used during the test).     FOLLOW UP: Our staff will call the number listed on your records the next business day following your procedure to check on you and address any questions or concerns that you may have regarding the information given to you following your procedure. If we do not reach you, we will leave a message.  However, if you are feeling well and you are not experiencing any problems, there is no need to return our call.  We will assume that you have returned to your regular daily activities without incident.  If any biopsies were taken you will be contacted by phone or by letter within the next 1-3 weeks.  Please call us at [phone] if you have not heard about the biopsies in 3 weeks.   Repeat next screening Colonoscopy in 5 years Hemorrhoids (handout given) Diverticulosis (handout given)  SIGNATURES/CONFIDENTIALITY: You and/or your care partner have signed paperwork which will be entered into your electronic medical record.  These signatures attest to the fact that that the information above on your After Visit Summary has been reviewed and is understood.  Full responsibility of the confidentiality of this discharge information lies with you and/or your care-partner.

## 2018-12-16 NOTE — Progress Notes (Signed)
Pt had some nausea and vomiting clear fluids, after releasing air pt verbalize feeling better.

## 2018-12-17 ENCOUNTER — Telehealth: Payer: Self-pay

## 2018-12-17 DIAGNOSIS — H903 Sensorineural hearing loss, bilateral: Secondary | ICD-10-CM | POA: Diagnosis not present

## 2018-12-17 NOTE — Telephone Encounter (Signed)
  Follow up Call-  Call back number 12/16/2018  Post procedure Call Back phone  # [phone]  Permission to leave phone message Yes  Some recent data might be hidden     Patient questions:  Do you have a fever, pain , or abdominal swelling? No. Pain Score  0 *  Have you tolerated food without any problems? Yes.    Have you been able to return to your normal activities? Yes.    Do you have any questions about your discharge instructions: Diet   No. Medications  No. Follow up visit  No.  Do you have questions or concerns about your Care? No.  Actions: * If pain score is 4 or above: No action needed, pain <4.

## 2018-12-17 NOTE — Telephone Encounter (Signed)
Could not leave message, mailbox full

## 2019-01-18 NOTE — Progress Notes (Signed)
Chief Complaint  Patient presents with  . Medicare Wellness    fasting AWV/CPE. Left shoulder pain worsening, has some right shoulder pain as well. Can barely move his left shoulder-using lidocaine patches and tylenol but not reallt helping. And his hand also hurt as well. If he is sittingf for a while his legs seems to be really stiff when he gets up. No other issues. He will schedule eye exam.      [name] is a 68 y.o. male who presents for annual wellness visit and follow-up on chronic medical conditions.  He has the following concerns:  He was seen last month with left hip pain, felt to have tendonitis.  He was prescribed naproxen (not to start until after colonoscopy). He took this, and reports it didn't help. Over the last couple of weeks the hip has improved, pain is sporadic. He is complaining of pain in his knees and both hips after seated for a while (ie drive to beach)-- feels stiff, can't stand up straight for a while.  Feels better after walking around a bit.  He has been stretching daily.  Bilateral shoulder pain started shortly after his colonoscopy.  No known injury or change in activity.  Some days it is the right shoulder, others it is the left (today the left shoulder is very painful).  Hurts to reach overhead--trying force it to avoid frozen shoulder.  He thinks the pain may have started while he was still taking the naproxen. Lidocaine patches at night helps, but he still wakes up with stabbing pain in both shoulders. Sleep is interrupted due to shoulder pain.  Also uses heating pad at night.  Pain is less during the day (other than reaching overhead).  Takes tylenol arthritis at bedtime, without much help. The last week and a half, hasn't been able to play pickleball due to shoulder pain (one or the other).  HTN: He has been monitoring at home with [employer] monitor. BP's have been 130-135/78-80.  At his last med check, BP's had been a little high with admitted increased  sodium intake.  His monitor was fairly accurate (the diastolic was a little higher by me than on his monitor; systolic was accurate).    Denies headaches.  Reports occasional dizziness when he gets up in the morning, stands quickly (less often since moving slower due to stiffness). Hasn't had any chest pain since the last stent was placed. No dyspnea on exertion. He tries to limit salt in his diet.  2 days ago, while standing in field for civil war re-enactment for extended period of time, he felt very dizzy, thought he might pass out. He had walked more than his usual amount (about an hour) prior to standing, then was standing for prolonged period without moving.  He sat on the ground, tried to lean forward to get his head down, and it took about 20-30 minutes before he felt better. Hasn't felt bad since.  CAD: He had another DES placed 05/23/14 to LAD. He completed cardiac rehab and Plavix x 1 year. He continues to see Dr. Marlou Porch, without any new changes in medication. Last seen in May, 2019. He is active at the gym, and denies angina.  GERD: His Nexium was changed to Protonixa couple of years ago(due to interaction with Plavix). Initially he had some heartburn after the change, but it has resolved. He is now off the Plavix, but the Protonix seems to work well, so continues on this med.  Uses it  once daily. Denies dysphagia. He has recurrent symptoms if he misses the medication for 2 days.  Hyperlipidemia: Patient is reportedly following a low-fat, low cholesterol diet. Compliant with medications and denies medication side effects. Last lipids were at goal. Due for recheck. Lab Results  Component Value Date   CHOL 127 01/14/2018   HDL 42 01/14/2018   LDLCALC 59 01/14/2018   TRIG 130 01/14/2018   CHOLHDL 3.0 01/14/2018    Anxiety: Doing well on citalopram 59m. Had some recurrent symptoms when titrated the dose down further in the past.  Denies depression. He reports doing well  overall (daughter passed away, family had been living with them, and recently moved into their own home).  Just got back from a week at the beach with his wife.  Vitamin D deficiency:He was treatedin the past with prescription weekly replacement.Last check was in 12/2017 and level was 35.5.  He continues on 1000 IU daily.  Kidney stone: He had one in September 2015, took Flomax and passed the stone. No further problems.  Prostate cancer: diagnosed 2008, treated with brachytherapy. Under the care of Dr. OKarsten Ro last seen in 10/2018, and reports that PSA was undetectable (report hasn't been received yet).  ED: Previously used Levitra, which was a little more effective than his sildenafil. He takes 2 tablets at a time, is "so-so".  Denies side effects. He gets this from his urologist.  Immunization History  Administered Date(s) Administered  . Influenza Split 11/03/2011, 10/29/2012  . Influenza, High Dose Seasonal PF 09/03/2016, 10/28/2018  . Influenza,inj,Quad PF,6+ Mos 10/26/2013, 08/30/2014  . Influenza-Unspecified 10/16/2015, 10/17/2017  . Pneumococcal Conjugate-13 11/29/2015  . Pneumococcal Polysaccharide-23 12/03/2016  . Td 11/26/2005  . Tdap 11/03/2011  . Zoster 02/28/2014   Last colonoscopy:11/2018 Dr. Pyrtle--diverticulosis and internal hemorrhoids. 5 year f/u recommended (prior colonoscopy was 10/2015--+polyps x4, tubular adenomas) Last PSA:10/2018 at Dr. OSimone Curiaoffice per pt; report not yet received. Pt states PSA was undetectable Ophtho: 2 years ago, due Dentist: twice yearly  Exercise:[employer] 2-3 days/week--pickleball (doubles) 3x/week x 75-90 min   Other doctors caring for patient include: Urologist: Dr. OKarsten RoDentist: Dr. WMina [facility]at [facility]iologist: Dr. SMarlou PorchGI: Dr. PHilarie FredricksonOphtho:Dr. HClaudean Kinds  Depression screen: Negative No falls in the last year Functional status screen notable for leakage of urine(slight urge incontinence),  stiffness affecting his walking/bathing some, recently. Got hearing aids. Mini-Cog screen: 4/5 (missed 1 recall)--recalled all 3 words later in the visit, at beginning and end (missed sunrise when nurse did testing, recalled all 3 when MD was in room, without reminders)  End of Life Discussion: Patient hasa living will and medical power of attorney, but needs to be notarized.  Past Medical History:  Diagnosis Date  . Allergy   . Anxiety   . Arthritis    hips  . CAD (coronary artery disease) 2010, 2015 stents   a. s/p DES to LAD 06/2009. b. LHC (05/23/14): LAD proximal to previously placed stent  30%, mid LAD distal to the stent 99%, CFX and RCA No CAD EF 50% with ant HK. PCI:  18 x 2.5 mm diameter Xience Alpine DES to mid LAD.  .Marland KitchenColon polyps 10/2015   tubular adeomas x 4  . Depression   . Diverticulosis 10/2015   noted on colonoscopy  . ED (erectile dysfunction)   . GERD (gastroesophageal reflux disease)   . Hyperlipidemia   . Hypertension   . Inguinal hernia, left 08/2014   seen on CT (containing peritoneal fat)  .  Internal hemorrhoids 10/2015   noted on colonoscopy  . Irregular heartbeat    started on beta blocker by Dr. Marlou Porch, improved  . Kidney stone 03/2010   08/2014-distal R ureter  . Myocardial infarction ([place])    2010  . Prostate cancer ([place]) 01/2008    s/p brachiotherapy; Dr. Karsten Ro  . Shingles 2010    Past Surgical History:  Procedure Laterality Date  . CARDIAC CATHETERIZATION  05/23/2014  . COLONOSCOPY  2005, 10/2015  . CORONARY ANGIOPLASTY WITH STENT PLACEMENT  06/2009   "1"  . INGUINAL HERNIA REPAIR Left 10/1998  . LEFT HEART CATHETERIZATION WITH CORONARY ANGIOGRAM N/A 05/23/2014   Procedure: LEFT HEART CATHETERIZATION WITH CORONARY ANGIOGRAM;  Surgeon: Candee Furbish, MD;  Location: [facility] CATH LAB;  Service: Cardiovascular;  Laterality: N/A;  . PERCUTANEOUS CORONARY STENT INTERVENTION (PCI-S) N/A 05/24/2014   Procedure: PERCUTANEOUS CORONARY STENT INTERVENTION  (PCI-S);  Surgeon: Sinclair Grooms, MD;  Location: [facility] CATH LAB;  Service: Cardiovascular;  Laterality: N/A;  . POLYPECTOMY    . RADIOACTIVE SEED IMPLANT  01/2008   prostate cancer  . VASECTOMY      Social History   Socioeconomic History  . Marital status: Married    Spouse name: Not on file  . Number of children: 3  . Years of education: Not on file  . Highest education level: Not on file  Occupational History  . Occupation: Arts development officer: [employer]  Social Needs  . Financial resource strain: Not on file  . Food insecurity:    Worry: Not on file    Inability: Not on file  . Transportation needs:    Medical: Not on file    Non-medical: Not on file  Tobacco Use  . Smoking status: Former Smoker    Types: Cigarettes, Pipe, Cigars    Last attempt to quit: 12/29/1976    Years since quitting: 42.0  . Smokeless tobacco: Never Used  . Tobacco comment: distant tobacco h/o in college (2-3 packs/week); +pipe/cigar use occ  x 15 years; quit  in the 1990's  Substance and Sexual Activity  . Alcohol use: Yes    Alcohol/week: 5.0 standard drinks    Types: 5 Cans of beer per week    Comment: 5 beers per week (1-2 max at a time)  . Drug use: No  . Sexual activity: Yes    Partners: Female  Lifestyle  . Physical activity:    Days per week: Not on file    Minutes per session: Not on file  . Stress: Not on file  Relationships  . Social connections:    Talks on phone: Not on file    Gets together: Not on file    Attends religious service: Not on file    Active member of club or organization: Not on file    Attends meetings of clubs or organizations: Not on file    Relationship status: Not on file  . Intimate partner violence:    Fear of current or ex partner: Not on file    Emotionally abused: Not on file    Physically abused: Not on file    Forced sexual activity: Not on file  Other Topics Concern  . Not on file  Social History Narrative   Married.  Lives with wife. Daughter passed away 05-13-18 (mesothelioma).   Other daughter lives in [place] with 3 add'l grandchildren.   5 grandchildren (locally) and one in [place].   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    Family History  Problem Relation Age of Onset  . Heart disease Father 16  . Prostate cancer Father 53  . Hypertension Father   . Hyperlipidemia Father   . Dementia Father   . Eating disorder Mother   . Bladder Cancer Mother        and peritoneal cancer  . Hodgkin's lymphoma Daughter   . Cardiomyopathy Daughter        related to treatment for lymphoma  . Thyroid cancer Daughter   . Heart disease Daughter        congenital ASD, s/p repair  . Cancer Daughter        Hodkin's; thyroid CA; lymphoma; mesothelioma 10/2015  . Mesothelioma Daughter   . Heart disease Brother        arrhythmia  . Prostate cancer Paternal Grandfather   . Diabetes Neg Hx   . Colon cancer Neg Hx   . Colon polyps Neg Hx   . Esophageal cancer Neg Hx   . Rectal cancer Neg Hx   . Stomach cancer Neg Hx     Outpatient Encounter Medications as of 01/19/2019  Medication Sig  . aspirin 81 MG tablet Take 81 mg by mouth daily.    Marland Kitchen atorvastatin (LIPITOR) 40 MG tablet Take 1 tablet (40 mg total) by mouth daily.  . carvedilol (COREG) 3.125 MG tablet TAKE 1 TABLET TWICE A DAY (KEEP UPCOMING APPOINTMENT FOR FUTURE REFILLS)  . cholecalciferol (VITAMIN D) 1000 units tablet Take 1,000 Units by mouth daily.  . citalopram (CELEXA) 20 MG tablet Take 1 tablet (20 mg total) by mouth daily.  . diphenhydrAMINE (BENADRYL) 25 MG tablet Take 50 mg by mouth every evening.  . fish oil-omega-3 fatty acids 1000 MG capsule Take 1 g by mouth daily.    Marland Kitchen lisinopril (PRINIVIL,ZESTRIL) 20 MG tablet Take 1 tablet (20 mg total) by mouth daily.  . pantoprazole (PROTONIX) 40 MG tablet TAKE 1 TABLET (40 MG TOTAL) BY MOUTH DAILY. PLEASE KEEP UPCOMING APPT FOR FUTURE REFILLS  . triamcinolone cream (KENALOG) 0.1 % Apply sparingly  to affected areas of skin twice daily for up to 2 weeks. (Patient not taking: Reported on 01/19/2019)  . [DISCONTINUED] naproxen (NAPROSYN) 500 MG tablet Take 1 tablet (500 mg total) by mouth 2 (two) times daily with a meal.  . [DISCONTINUED] vardenafil (LEVITRA) 20 MG tablet Take 20 mg by mouth as needed.   Facility-Administered Encounter Medications as of 01/19/2019  Medication  . 0.9 %  sodium chloride infusion    Allergies  Allergen Reactions  . Imdur [Isosorbide Mononitrate] Rash    ROS: The patient denies anorexia, fever. No vision loss, ear pain, hoarseness, chest pain, palpitations, syncope, dyspnea on exertion, cough, swelling, nausea, vomiting, diarrhea, constipation, abdominal pain, melena, hematochezia, nocturia, weakened urine stream, dysuria, genital lesions, joint pains, numbness, tingling, weakness, tremor, suspicious skin lesions, depression, anxiety, abnormal bleeding/bruising, or enlarged lymph nodes.  +erectile dysfunction, urge incontinence (mild) Up 2x/night to void +Decrease in hearing--got hearing aids Heartburn only if misses his medication 2+ days. Some swelling in feet (sock marks), not worsening Intermittent rashes/eczema on legs, relieved by TAC 8# weight loss since his last visit (over 13# since August)--cooking more, eating out less.   Cut back on chips, cold cuts for lunch. Presyncope 2 days ago per HPI Shoulder and hip/knee pain per HPI.   PHYSICAL EXAM:  BP 120/70   Pulse 80   Ht 5' 10.5" (1.791 m)   Wt 200 lb 8  oz (90.9 kg)   BMI 28.36 kg/m   Wt Readings from Last 3 Encounters:  01/19/19 200 lb 8 oz (90.9 kg)  12/16/18 208 lb (94.3 kg)  12/15/18 209 lb 3.2 oz (94.9 kg)    General Appearance:  Alert, cooperative, in moderate discomfort due to left shoulder pain. At times he appears to be breathing differently due to discomfort, and holding onto the left arm.  Head:  Normocephalic, without obvious abnormality, atraumatic.   Eyes:   PERRL, conjunctiva/corneas clear, EOM's intact, fundi benign   Ears:  Wearing hearing aids bilaterally (TM's not examined)  Nose:  Nares normal, mucosa, no erythema or purulence. Sinuses nontender  Throat:  Lips, mucosa, and tongue normal; teeth and gums normal   Neck:  Supple, no lymphadenopathy; thyroid: no enlargement/tenderness/ nodules; no carotid bruit or JVD   Back:  Spine nontender, no curvature, ROM normal, no CVA tenderness   Lungs:  Clear to auscultation bilaterally without wheezes, rales or ronchi; respirations unlabored   Chest Wall:  No tenderness or deformity   Heart:  Regular rate and rhythm, S1 and S2 normal, no murmur, rub or gallop.   Breast Exam:  No chest wall tenderness, masses or gynecomastia   Abdomen:  Soft, non-tender, nondistended, normoactive bowel sounds, no masses, no hepatosplenomegaly   Genitalia:  Pt declined exam today  Rectal:  Deferred to Urologist.   Extremities:  No clubbing, cyanosis or edema. See below for shoulder exam  Pulses:  2+ and symmetric all extremities   Skin:  Skin color, texture, turgor normal.  Lymph nodes:  Cervical, supraclavicular, and axillary nodes normal   Neurologic:  CNII-XII intact, normal strength, sensation and gait; reflexes 2+ and symmetric throughout   Psych: Normal mood, affect, hygiene and grooming  Shoulders: Painful arcs bilaterally.  Decreased ROM on the left with abduction, only to 100 degrees; some decrease in forward flexion (only slightly), limited by discomfort. FROM on the right, though still was painful with abduction and FF. Impingement on the left Deltoid strength normal.  Normal supraspinatous. Overall, exam caused a lot of discomfort. Knees--FROM, no crepitus, no warmth or effusion.   ASSESSMENT/PLAN:  Annual physical exam - Plan: POCT Urinalysis DIP (Proadvantage Device)  Medicare annual wellness visit,  subsequent  Personal history of prostate cancer - get Dr. Simone Curia reports, normal per pt in November  Essential hypertension - controlled, even when in significant discomfort today - Plan: Comprehensive metabolic panel  Coronary artery disease due to lipid rich plaque - Plan: EKG 12-Lead  Pure hypercholesterolemia - due for recheck; at goal on current statin dose last year - Plan: Lipid panel  Gastroesophageal reflux disease without esophagitis - controlled, requires daily PPI  Vitamin D deficiency - continue daily supplement  Anxiety disorder, unspecified type - well controlled; to consider trial of decreasing to 1/2 dose when in a good place without stressors/pain in future  Medication monitoring encounter - Plan: CBC with Differential/Platelet, Comprehensive metabolic panel, Lipid panel  Erectile dysfunction after prostate brachytherapy - can try higher doses of sildenafil, up to 162m  Joint stiffness - unclear etiology, sounds like arthritic changes. Check x-rays. tendonitis vs bursitis in shoulders. Try another NSAID - Plan: Sedimentation Rate  Bilateral shoulder pain, unspecified chronicity - check x-ray; NSAID trial. May need PT vs ortho/cortisone shot - Plan: CBC with Differential/Platelet, Sedimentation Rate, meloxicam (MOBIC) 15 MG tablet, traMADol (ULTRAM) 50 MG tablet, DG Shoulder Left, DG Shoulder Right, EKG 12-Lead  Weight loss - pt reports eating differently/less/better recently. Normal  appetite - Plan: TSH  Anxiety state - Plan: citalopram (CELEXA) 20 MG tablet  Bilateral hip pain - Plan: meloxicam (MOBIC) 15 MG tablet, DG HIPS BILAT WITH PELVIS 3-4 VIEWS   c-met, CBC, lipid TSH only if sx--done due to weight loss PSA should be monitored by urologist  Xray bilat hips and shoulders May need ortho referral Consider cortisone shots  Tramadol #15 meloxicam 80m #15 Risks/side effects of meds reviewed.  EKG--left shoulder pain--NSR, no acute  changes   Recommended at least 30 minutes of aerobic activity at least 5 days/week, weight-bearing exercise 2x/wk; proper sunscreen use reviewed; healthy diet and alcohol recommendations (less than or equal to 2 drinks/day) reviewed; regular seatbelt use; changing batteries in smoke detectors. Self-testicular exams. Immunization recommendations discussed--continue high dose flu shots yearly.Shingrix recommended, to get from pharmacy; risks/side effects/benefits reviewed. Colonoscopy recommendations reviewed, UTD.  Please get your forms notarized and give [place] copy at your convenience. Schedule your routine eye exam.   You may increase the sildenafil dose to a maximum of 5 tablets (the equivalent of 1043mViagra) if the lower doses aren't effective.   Medicare Attestation I have personally reviewed: The patient's medical and social history Their use of alcohol, tobacco or illicit drugs Their current medications and supplements The patient's functional ability including ADLs,fall risks, home safety risks, cognitive, and hearing and visual impairment Diet and physical activities Evidence for depression or mood disorders  The patient's weight, height and BMI have been recorded in the chart.  I have made referrals, counseling, and provided education to the patient based on review of the above and I have provided the patient with a written personalized care plan for preventive services.

## 2019-01-18 NOTE — Patient Instructions (Addendum)
  HEALTH MAINTENANCE RECOMMENDATIONS:  It is recommended that you get at least 30 minutes of aerobic exercise at least 5 days/week (for weight loss, you may need as much as 60-90 minutes). This can be any activity that gets your heart rate up. This can be divided in 10-15 minute intervals if needed, but try and build up your endurance at least once a week.  Weight bearing exercise is also recommended twice weekly.  Eat a healthy diet with lots of vegetables, fruits and fiber.  "Colorful" foods have a lot of vitamins (ie green vegetables, tomatoes, red peppers, etc).  Limit sweet tea, regular sodas and alcoholic beverages, all of which has a lot of calories and sugar.  Up to 2 alcoholic drinks daily may be beneficial for men (unless trying to lose weight, watch sugars).  Drink a lot of water.  Sunscreen of at least SPF 30 should be used on all sun-exposed parts of the skin when outside between the hours of 10 am and 4 pm (not just when at beach or pool, but even with exercise, golf, tennis, and yard work!)  Use a sunscreen that says "broad spectrum" so it covers both UVA and UVB rays, and make sure to reapply every 1-2 hours.  Remember to change the batteries in your smoke detectors when changing your clock times in the spring and fall.  Use your seat belt every time you are in a car, and please drive safely and not be distracted with cell phones and texting while driving.    [name] , Thank you for taking time to come for your Medicare Wellness Visit. I appreciate your ongoing commitment to your health goals. Please review the following plan we discussed and let me know if I can assist you in the future.   This is a list of the screening recommended for you and due dates:  Health Maintenance  Topic Date Due  . Tetanus Vaccine  11/02/2021  . Colon Cancer Screening  12/17/2023  . Flu Shot  Completed  .  Hepatitis C: One time screening is recommended by Center for Disease Control  (CDC) for   adults born from [birth year] through 1966.   Completed  . Pneumonia vaccines  Completed   I recommend getting the new shingles vaccine (Shingrix). This should be covered by Medicare Part D, you need to get from the pharmacy rather than our office.  It is a series of 2 injections, spaced 2 months apart.  Please get your forms notarized and give [name] a copy at your convenience. Schedule your routine eye exam.   You may increase the sildenafil dose to a maximum of 5 tablets (the equivalent of 100mg  Viagra) if the lower doses aren't effective.  Go to [facility] to get the x-rays of hips and shoulders done. Start taking the meloxicam once daily with food to help with pain/stiffness. Take the tramadol only at bedtime, if needed for severe pain, to allow you some better sleep.

## 2019-01-19 ENCOUNTER — Encounter: Payer: Self-pay | Admitting: Family Medicine

## 2019-01-19 ENCOUNTER — Ambulatory Visit
Admission: RE | Admit: 2019-01-19 | Discharge: 2019-01-19 | Disposition: A | Discharge status: Home / Self Care | Payer: PPO | Source: Ambulatory Visit | Attending: Family Medicine | Admitting: Family Medicine

## 2019-01-19 ENCOUNTER — Ambulatory Visit (INDEPENDENT_AMBULATORY_CARE_PROVIDER_SITE_OTHER): Payer: PPO | Admitting: Family Medicine

## 2019-01-19 VITALS — BP 120/70 | HR 80 | Ht 70.5 in | Wt 200.5 lb

## 2019-01-19 DIAGNOSIS — K219 Gastro-esophageal reflux disease without esophagitis: Secondary | ICD-10-CM

## 2019-01-19 DIAGNOSIS — Z5181 Encounter for therapeutic drug level monitoring: Secondary | ICD-10-CM | POA: Diagnosis not present

## 2019-01-19 DIAGNOSIS — I10 Essential (primary) hypertension: Secondary | ICD-10-CM | POA: Diagnosis not present

## 2019-01-19 DIAGNOSIS — M25511 Pain in right shoulder: Secondary | ICD-10-CM

## 2019-01-19 DIAGNOSIS — M25551 Pain in right hip: Secondary | ICD-10-CM

## 2019-01-19 DIAGNOSIS — E559 Vitamin D deficiency, unspecified: Secondary | ICD-10-CM

## 2019-01-19 DIAGNOSIS — M25512 Pain in left shoulder: Secondary | ICD-10-CM

## 2019-01-19 DIAGNOSIS — Z8546 Personal history of malignant neoplasm of prostate: Secondary | ICD-10-CM | POA: Diagnosis not present

## 2019-01-19 DIAGNOSIS — M2560 Stiffness of unspecified joint, not elsewhere classified: Secondary | ICD-10-CM | POA: Diagnosis not present

## 2019-01-19 DIAGNOSIS — Z0000 Encounter for general adult medical examination without abnormal findings: Secondary | ICD-10-CM | POA: Diagnosis not present

## 2019-01-19 DIAGNOSIS — M791 Myalgia, unspecified site: Secondary | ICD-10-CM | POA: Diagnosis not present

## 2019-01-19 DIAGNOSIS — R634 Abnormal weight loss: Secondary | ICD-10-CM

## 2019-01-19 DIAGNOSIS — F419 Anxiety disorder, unspecified: Secondary | ICD-10-CM

## 2019-01-19 DIAGNOSIS — I2583 Coronary atherosclerosis due to lipid rich plaque: Secondary | ICD-10-CM

## 2019-01-19 DIAGNOSIS — F411 Generalized anxiety disorder: Secondary | ICD-10-CM

## 2019-01-19 DIAGNOSIS — M25552 Pain in left hip: Secondary | ICD-10-CM

## 2019-01-19 DIAGNOSIS — N5235 Erectile dysfunction following radiation therapy: Secondary | ICD-10-CM

## 2019-01-19 DIAGNOSIS — E7800 Pure hypercholesterolemia, unspecified: Secondary | ICD-10-CM

## 2019-01-19 DIAGNOSIS — I2510 Atherosclerotic heart disease of native coronary artery without angina pectoris: Secondary | ICD-10-CM | POA: Diagnosis not present

## 2019-01-19 DIAGNOSIS — M160 Bilateral primary osteoarthritis of hip: Secondary | ICD-10-CM | POA: Diagnosis not present

## 2019-01-19 LAB — POCT URINALYSIS DIP (PROADVANTAGE DEVICE)
BILIRUBIN UA: NEGATIVE
BILIRUBIN UA: NEGATIVE mg/dL
Blood, UA: NEGATIVE
Glucose, UA: NEGATIVE mg/dL
Leukocytes, UA: NEGATIVE
Nitrite, UA: NEGATIVE
Specific Gravity, Urine: 1.02
Urobilinogen, Ur: NEGATIVE
pH, UA: 6 (ref 5.0–8.0)

## 2019-01-19 MED ORDER — MELOXICAM 15 MG PO TABS: 15.0000 mg | ORAL_TABLET | Freq: Every day | ORAL | 0 refills | Status: DC

## 2019-01-19 MED ORDER — CITALOPRAM HYDROBROMIDE 20 MG PO TABS: 20.0000 mg | ORAL_TABLET | Freq: Every day | ORAL | 3 refills | Status: AC

## 2019-01-19 MED ORDER — TRAMADOL HCL 50 MG PO TABS: ORAL_TABLET | ORAL | 0 refills | Status: DC

## 2019-01-20 ENCOUNTER — Telehealth: Payer: Self-pay | Admitting: Family Medicine

## 2019-01-20 LAB — COMPREHENSIVE METABOLIC PANEL
ALT: 17 IU/L (ref 0–44)
AST: 20 IU/L (ref 0–40)
Albumin/Globulin Ratio: 1.3 (ref 1.2–2.2)
Albumin: 4 g/dL (ref 3.8–4.8)
Alkaline Phosphatase: 75 IU/L (ref 39–117)
BUN/Creatinine Ratio: 18 (ref 10–24)
BUN: 22 mg/dL (ref 8–27)
Bilirubin Total: 0.6 mg/dL (ref 0.0–1.2)
CALCIUM: 9.3 mg/dL (ref 8.6–10.2)
CO2: 20 mmol/L (ref 20–29)
Chloride: 101 mmol/L (ref 96–106)
Creatinine, Ser: 1.25 mg/dL (ref 0.76–1.27)
GFR calc Af Amer: 68 mL/min/{1.73_m2} (ref >59)
GFR calc non Af Amer: 59 mL/min/{1.73_m2} — ABNORMAL LOW (ref >59)
Globulin, Total: 3 g/dL (ref 1.5–4.5)
Glucose: 98 mg/dL (ref 65–99)
Potassium: 5.1 mmol/L (ref 3.5–5.2)
Sodium: 140 mmol/L (ref 134–144)
Total Protein: 7 g/dL (ref 6.0–8.5)

## 2019-01-20 LAB — LIPID PANEL
Chol/HDL Ratio: 2.3 ratio (ref 0.0–5.0)
Cholesterol, Total: 95 mg/dL — ABNORMAL LOW (ref 100–199)
HDL: 41 mg/dL (ref >39)
LDL Calculated: 38 mg/dL (ref 0–99)
Triglycerides: 82 mg/dL (ref 0–149)
VLDL Cholesterol Cal: 16 mg/dL (ref 5–40)

## 2019-01-20 LAB — CBC WITH DIFFERENTIAL/PLATELET
Basophils Absolute: 0 10*3/uL (ref 0.0–0.2)
Basos: 0 %
EOS (ABSOLUTE): 0.1 10*3/uL (ref 0.0–0.4)
Eos: 1 %
HEMOGLOBIN: 12.8 g/dL — AB (ref 13.0–17.7)
Hematocrit: 37 % — ABNORMAL LOW (ref 37.5–51.0)
Immature Grans (Abs): 0 10*3/uL (ref 0.0–0.1)
Immature Granulocytes: 0 %
Lymphocytes Absolute: 1.9 10*3/uL (ref 0.7–3.1)
Lymphs: 17 %
MCH: 30.9 pg (ref 26.6–33.0)
MCHC: 34.6 g/dL (ref 31.5–35.7)
MCV: 89 fL (ref 79–97)
MONOCYTES: 10 %
Monocytes Absolute: 1.1 10*3/uL — ABNORMAL HIGH (ref 0.1–0.9)
Neutrophils Absolute: 8.1 10*3/uL — ABNORMAL HIGH (ref 1.4–7.0)
Neutrophils: 72 %
Platelets: 403 10*3/uL (ref 150–450)
RBC: 4.14 x10E6/uL (ref 4.14–5.80)
RDW: 12.1 % (ref 11.6–15.4)
WBC: 11.4 10*3/uL — ABNORMAL HIGH (ref 3.4–10.8)

## 2019-01-20 LAB — SEDIMENTATION RATE: Sed Rate: 110 mm/hr — ABNORMAL HIGH (ref 0–30)

## 2019-01-20 LAB — TSH: TSH: 1.81 u[IU]/mL (ref 0.450–4.500)

## 2019-01-20 NOTE — Telephone Encounter (Signed)
Pt called and wanted to let Dr. Tomi Bamberger know that he has improvement on medication. Pt can be reached at [phone].

## 2019-01-20 NOTE — Telephone Encounter (Signed)
Called pt back--LVM--to ensure that he checks and looks at all his results.  Glad to hear that med is helping some.  Awaiting additional tests that were added.

## 2019-01-21 ENCOUNTER — Encounter: Payer: Self-pay | Admitting: Family Medicine

## 2019-01-21 DIAGNOSIS — M25511 Pain in right shoulder: Secondary | ICD-10-CM

## 2019-01-21 DIAGNOSIS — M25512 Pain in left shoulder: Principal | ICD-10-CM

## 2019-01-21 MED ORDER — PREDNISONE 10 MG (21) PO TBPK: ORAL_TABLET | ORAL | 0 refills | Status: DC

## 2019-01-25 LAB — SPECIMEN STATUS REPORT

## 2019-01-30 ENCOUNTER — Other Ambulatory Visit: Payer: Self-pay | Admitting: Family Medicine

## 2019-01-30 ENCOUNTER — Encounter: Payer: Self-pay | Admitting: Family Medicine

## 2019-01-30 DIAGNOSIS — M25512 Pain in left shoulder: Principal | ICD-10-CM

## 2019-01-30 DIAGNOSIS — M25511 Pain in right shoulder: Secondary | ICD-10-CM

## 2019-01-31 ENCOUNTER — Ambulatory Visit: Payer: PPO | Admitting: Family Medicine

## 2019-01-31 ENCOUNTER — Other Ambulatory Visit: Payer: PPO

## 2019-01-31 DIAGNOSIS — M25511 Pain in right shoulder: Secondary | ICD-10-CM | POA: Diagnosis not present

## 2019-01-31 DIAGNOSIS — M25512 Pain in left shoulder: Secondary | ICD-10-CM | POA: Diagnosis not present

## 2019-02-01 LAB — CK: Total CK: 32 U/L (ref 24–204)

## 2019-02-01 LAB — SEDIMENTATION RATE: Sed Rate: 23 mm/hr (ref 0–30)

## 2019-02-04 LAB — SPECIMEN STATUS REPORT

## 2019-02-04 LAB — CREATININE KINASE MB: CK-MB Index: 1.3 ng/mL (ref 0.0–10.4)

## 2019-02-04 LAB — ANA W/REFLEX IF POSITIVE: ANA: NEGATIVE

## 2019-02-06 NOTE — Progress Notes (Signed)
Chief Complaint  Patient presents with  . Follow-up    shoulder pain not getting any better.    Patient presents to f/u on bilateral shoulder pain, as well as hip pain that he reported at his recent visit.  His ESR was very elevated.  We prescribed meloxicam, which gave partial relief, subsequently gave a course of steroids.  Pain improved significantly with the steroids--even just after the first 13m dose,  but recurred upon completing the steroids.  He resumed taking meloxicam,which doesn't seem to be working as well as it did initially.  Repeat ESR was improved (went from 110 down to 23 last week).  CK total level was not done initially (MB done in error), but levels were normal (when sed rate normal) when he had mild pain recurring.  ANA was negative.  He reports that his shoulder pain is back to the same level as when he was here for his physical, prior to the prednisone.   It is now in both shoulders.  Trouble raising his arms overhead (to put on coat), sometimes has a dull ache just sitting around, hurts when driving for a while, having hands on steering wheel.  Pain is in upper arms, around deltoids, not in the shoulder joint itself.    The hip pain has resolved. He reports he had some pain in the left medial thigh recently, using lidocaine patch which helped.  Hasn't used since since yesterday and feels better today.  Pain 8/10 currently.  PMH, PSH, SH reviewed  Outpatient Encounter Medications as of 02/07/2019  Medication Sig Note  . aspirin 81 MG tablet Take 81 mg by mouth daily.     .Marland Kitchenatorvastatin (LIPITOR) 40 MG tablet Take 1 tablet (40 mg total) by mouth daily.   . carvedilol (COREG) 3.125 MG tablet TAKE 1 TABLET TWICE A DAY (KEEP UPCOMING APPOINTMENT FOR FUTURE REFILLS)   . cholecalciferol (VITAMIN D) 1000 units tablet Take 1,000 Units by mouth daily.   . citalopram (CELEXA) 20 MG tablet Take 1 tablet (20 mg total) by mouth daily.   . diphenhydrAMINE (BENADRYL) 25 MG tablet Take  50 mg by mouth every evening.   . fish oil-omega-3 fatty acids 1000 MG capsule Take 1 g by mouth daily.     .Marland Kitchenlisinopril (PRINIVIL,ZESTRIL) 20 MG tablet Take 1 tablet (20 mg total) by mouth daily.   . meloxicam (MOBIC) 15 MG tablet Take 1 tablet (15 mg total) by mouth daily.   . pantoprazole (PROTONIX) 40 MG tablet TAKE 1 TABLET (40 MG TOTAL) BY MOUTH DAILY. PLEASE KEEP UPCOMING APPT FOR FUTURE REFILLS   . sildenafil (REVATIO) 20 MG tablet Take 20 mg by mouth 3 (three) times daily. 01/19/2019: From urologist for ED--taking 2 at a time   . traMADol (ULTRAM) 50 MG tablet Take 1-2 tablets at bedtime prn for severe pain   . predniSONE (DELTASONE) 10 MG tablet Take as directed, tapering from 680mdown to 2076mver the week, and staying at 78m32m. triamcinolone cream (KENALOG) 0.1 % Apply sparingly to affected areas of skin twice daily for up to 2 weeks. (Patient not taking: Reported on 01/19/2019)   . [DISCONTINUED] predniSONE (STERAPRED UNI-PAK 21 TAB) 10 MG (21) TBPK tablet Take by mouth as directed    Facility-Administered Encounter Medications as of 02/07/2019  Medication  . 0.9 %  sodium chloride infusion   (not taking prednisone prior to visit, was taking meloxicam)  Allergies  Allergen Reactions  . Imdur [Isosorbide Mononitrate]  Rash   ROS: no fever, chills, URI symptoms, headaches, dizziness. He reports he had CP x 10 minutes on Saturday (2 days ago).  Was playing a video game, standing, not exerting himself.  Sat down, and resolved on its own.  No heartburn. Denies other episodes of chest pain, no CP or dyspnea with exertion. No bleeding, bruising, rash.  Joint/muscle pains per HPI.  Moods are good.   PHYSICAL EXAM:  BP 124/70   Pulse 76   Ht '5\' 11"'  (1.803 m)   Wt 201 lb (91.2 kg)   BMI 28.03 kg/m   Well appearing, pleasant male, in no distress, sitting comfortably. Exam of UE's: Full passive range of motion of shoulders bilaterally. AROM limited by pain (vs weakness), didn't  even get abduction to 90 degrees on the right, got to 90-100 degrees on the left, appeared to be in moderate discomfort. FROM forward flexion actively bilaterally No pain with internal rotation, full subscapularis strength. Some mild weakness with external rotation against resistance bilaterally Possibly minimal impingement on the right (I think is more pain with the rotation/position).  ASSESSMENT/PLAN:  Bilateral shoulder pain, unspecified chronicity - suspect polymyalgia rheumatic given involvement of proximal muscles and rapid improvement with prednisone. Rheum consult, restart prednisone - Plan: Sedimentation Rate, CK, predniSONE (DELTASONE) 10 MG tablet, Ambulatory referral to Rheumatology   Recheck ESR and total CK today--a week later, being off prednisone and only on meloxicam, in 8/10 pain. Risks/SE of prednisone, NSAID (which he is to stop), and pain meds reviewed.  Reviewed DDx in detail, and given info on PMR, discussed.  25 min visit, more than 1/2 spent counseling. All questions answered.  I'm concerned that you may have polymyalgia rheurmatica.  The fact that you had such a good (but temporary) response to the prednisone course goes along with this. We are referring you to a rheumatologist, but rechecking labs and putting you back on prednisone in the meantime.  Take 6 tablets together with breakfast tomorrow (Tuesday) Take 5 tablets on Wednesday Take 4 tabets on Thursday Take 3 tablets on Friday Take 2 tablets starting on Saturday, and for at least a week.  If you have worsening pain as the dose is lowered, please contact us we may give you directions to increase the dose. If/when doing very well, we may be able to further taper down to 15m after 1-2 weeks.  You may continue tylenol and/or tramadol along with the prednisone, if needed, for pain. Stop the Meloxicam.  Be sure to contact Dr. SKingsley Planoffice if you have any recurrent chest pain.

## 2019-02-07 ENCOUNTER — Encounter: Payer: Self-pay | Admitting: Family Medicine

## 2019-02-07 ENCOUNTER — Ambulatory Visit (INDEPENDENT_AMBULATORY_CARE_PROVIDER_SITE_OTHER): Payer: PPO | Admitting: Family Medicine

## 2019-02-07 VITALS — BP 124/70 | HR 76 | Ht 71.0 in | Wt 201.0 lb

## 2019-02-07 DIAGNOSIS — M25512 Pain in left shoulder: Secondary | ICD-10-CM

## 2019-02-07 DIAGNOSIS — M25511 Pain in right shoulder: Secondary | ICD-10-CM

## 2019-02-07 MED ORDER — PREDNISONE 10 MG PO TABS: ORAL_TABLET | ORAL | 0 refills | Status: DC

## 2019-02-07 NOTE — Patient Instructions (Addendum)
I'm concerned that you may have polymyalgia rheurmatica.  The fact that you had such a good (but temporary) response to the prednisone course goes along with this. We are referring you to a rheumatologist, but rechecking labs and putting you back on prednisone in the meantime.  Take 6 tablets together with breakfast tomorrow (Tuesday) Take 5 tablets on Wednesday Take 4 tabets on Thursday Take 3 tablets on Friday Take 2 tablets starting on Saturday, and for at least a week.  If you have worsening pain as the dose is lowered, please contact us we may give you directions to increase the dose. If/when doing very well, we may be able to further taper down to 10mg  after 1-2 weeks.  You may continue tylenol and/or tramadol along with the prednisone, if needed, for pain. Stop the Meloxicam.   Be sure to contact Dr. Kingsley Plan office if you have any recurrent chest pain.   Polymyalgia Rheumatica Polymyalgia rheumatica (PMR) is an inflammatory disorder that causes aching and stiffness in your muscles and joints. Sometimes, PMR leads to a more dangerous condition (temporal arteritis or giant cell arteritis), which can cause vision loss. What are the causes? The exact cause of PMR is not known. What increases the risk? This condition is more likely to develop in:  Females.  People who are 69 years of age or older.  Caucasians. What are the signs or symptoms? Pain and stiffness are the main symptoms of PMR. Symptoms may start slowly or suddenly. The symptoms:  May be worse after inactivity and in the morning.  May affect your: ? Hips, buttocks, and thighs. ? Neck, arms, and shoulders. This can make it hard to raise your arms above your head. ? Hands and wrists. Other symptoms include:  Fever.  Tiredness.  Weakness.  Decreased appetite. This may lead to weight loss. How is this diagnosed? This condition is diagnosed with a medical history and physical exam. You may need to see a  health care provider who specializes in diseases of the joint, muscles, and bones (rheumatologist). You may also have tests, including:  Blood tests.  X-rays. How is this treated? PMR usually goes away without treatment, but it may take years for that to happen. In the meantime, your health care provider may recommend low-dose steroids to help manage your symptoms of pain and stiffness. Regular exercise and rest will also help your symptoms. Follow these instructions at home:  Take over-the-counter and prescription medicines only as told by your health care provider.  Make sure to get enough rest and sleep.  Eat a healthy and nutritious diet.  Try to exercise most days of the week. Ask your health care provider what type of exercise is best for you.  Keep all follow-up visits as told by your health are provider. This is important. Contact a health care provider if:  Your symptoms are not controlled with medicine.  You have side effects from steroids. These may include: ? Weight gain. ? Swelling. ? Insomnia. ? Mood changes. ? Bruising. ? High blood sugar readings, if you have diabetes. ? Higher than normal blood pressure readings, if you monitor your blood pressure. Get help right away if:  You develop symptoms of temporal arteritis, such as: ? A change in vision. ? Severe headache. ? Scalp pain. ? Jaw pain. This information is not intended to replace advice given to you by your health care provider. Make sure you discuss any questions you have with your health care provider. Document Released:  01/22/2005 Document Revised: 05/22/2016 Document Reviewed: 06/27/2015 Elsevier Interactive Patient Education  [employer].

## 2019-02-08 LAB — SEDIMENTATION RATE: Sed Rate: 66 mm/hr — ABNORMAL HIGH (ref 0–30)

## 2019-02-08 LAB — CK: Total CK: 42 U/L (ref 24–204)

## 2019-02-16 ENCOUNTER — Other Ambulatory Visit: Payer: Self-pay | Admitting: Family Medicine

## 2019-02-16 DIAGNOSIS — M25512 Pain in left shoulder: Principal | ICD-10-CM

## 2019-02-16 DIAGNOSIS — M25511 Pain in right shoulder: Secondary | ICD-10-CM

## 2019-02-16 MED ORDER — PREDNISONE 20 MG PO TABS: 20.0000 mg | ORAL_TABLET | Freq: Every day | ORAL | 0 refills | Status: DC

## 2019-02-16 NOTE — Telephone Encounter (Signed)
Patient states that he is still having some persistent pain on the 20mg  so I sent #30 of the 20mg .

## 2019-02-16 NOTE — Telephone Encounter (Signed)
See patient message on this refill-he is requesting this.

## 2019-02-16 NOTE — Telephone Encounter (Signed)
Call pt to see his current dose (20mg ?) and pain level. If pain is gone, can try decreasing to 10mg  tablets daily. If pain persists some at the 20mg  dose, then send in #30 of 20mg  prednisone, rather than refill #50 of 10mg .

## 2019-03-09 NOTE — Progress Notes (Signed)
Office Visit Note  Patient: [name]             Date of Birth: 1951/02/21           MRN: [id number]             PCP: Rita Ohara, MD Referring: Rita Ohara, MD Visit Date: 03/23/2019 Occupation: Retired, Freight forwarder  Subjective:  Pain in muscles and joints.   History of Present Illness: [name] is a 68 y.o. male seen in consultation per request of his PCP.  According to patient his symptoms started in November 2019 with bilateral hip joint pain.  He states he had difficulty walking and standing.  He had difficulty getting up from the chair.  He states gradually the symptoms in his hip joints got better but he had pain in his bilateral arms and had difficulty lifting her arms.  He was having difficulty getting up from the bed and doing routine activities.  He also had some pain and stiffness in his hands but no swelling.  He states in February 2020 he was seen by his PCP at the time he was taking tramadol and meloxicam.  In mid February he was given a prednisone taper which helped.  He had another prednisone taper which also helped with the symptoms recurred.  Then he was placed on prednisone 20 mg a day.  He states he has been doing really well on prednisone 20 mg a day.  He is got no symptoms currently.  He denies any joint pain or joint swelling.  He has no muscle weakness.  He denies any history of fever.  There is no history of exposure to any infections.  Activities of Daily Living:  Patient reports morning stiffness for 0 minute.   Patient Denies nocturnal pain.  Difficulty dressing/grooming: Denies Difficulty climbing stairs: Denies Difficulty getting out of chair: Denies Difficulty using hands for taps, buttons, cutlery, and/or writing: Denies  Review of Systems  Constitutional: Negative for fatigue and night sweats.  HENT: Negative for mouth sores, mouth dryness and nose dryness.   Eyes: Negative for redness and dryness.  Respiratory: Negative for shortness of  breath and difficulty breathing.   Cardiovascular: Negative for chest pain, palpitations, hypertension, irregular heartbeat and swelling in legs/feet.  Gastrointestinal: Negative for constipation and diarrhea.  Endocrine: Negative for increased urination.  Musculoskeletal: Negative for arthralgias, joint pain, joint swelling, myalgias, muscle weakness, morning stiffness, muscle tenderness and myalgias.  Skin: Negative for color change, rash, hair loss, nodules/bumps, skin tightness, ulcers and sensitivity to sunlight.  Allergic/Immunologic: Negative for susceptible to infections.  Neurological: Negative for dizziness, fainting, memory loss, night sweats and weakness ( ).  Hematological: Negative for swollen glands.  Psychiatric/Behavioral: Negative for depressed mood and sleep disturbance. The patient is not nervous/anxious.     PMFS History:  Patient Active Problem List   Diagnosis Date Noted  . Abdominal aortic atherosclerosis ([place]) 12/03/2016  . 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 Vitamin D deficiency 11/27/2015  . Atherosclerosis of native coronary artery of native heart without angina pectoris 09/21/2014  . Essential hypertension, benign 09/21/2014  . Chest pain 05/25/2014  . Ischemic cardiomyopathy   . Hypertension   . Abnormal nuclear stress test 05/18/2014  . 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 Pure hypercholesterolemia 11/03/2011  . CAD (coronary artery disease) 11/03/2011  . Elevated cholesterol 02/17/2011  . GERD (gastroesophageal reflux disease) 02/17/2011  . Prostate cancer ([place]) 09/29/2007    Past Medical History:  Diagnosis  Date  . Allergy   . Anxiety   . Arthritis    hips  . CAD (coronary artery disease) 2010, 2015 stents   a. s/p DES to LAD 06/2009. b. LHC (May 29, 2014): LAD proximal to previously placed stent  30%, mid LAD distal to the stent 99%, CFX and RCA No CAD EF 50% with ant HK. PCI:  18 x 2.5 mm diameter Xience Alpine DES to mid LAD.  Marland Kitchen  Colon polyps 10/2015   tubular adeomas x 4  . Depression   . Diverticulosis 10/2015   noted on colonoscopy  . ED (erectile dysfunction)   . GERD (gastroesophageal reflux disease)   . Hyperlipidemia   . Hypertension   . Inguinal hernia, left 08/2014   seen on CT (containing peritoneal fat)  . Internal hemorrhoids 10/2015   noted on colonoscopy  . Irregular heartbeat    started on beta blocker by Dr. Marlou Porch, improved  . Kidney stone 03/2010   08/2014-distal R ureter  . Myocardial infarction ([place])    2010  . Prostate cancer ([place]) 01/2008    s/p brachiotherapy; Dr. Karsten Ro  . Shingles 2010    Family History  Problem Relation Age of Onset  . Heart disease Father 17  . Prostate cancer Father 34  . Hypertension Father   . Hyperlipidemia Father   . Dementia Father   . Eating disorder Mother   . Bladder Cancer Mother        and peritoneal cancer  . Hodgkin's lymphoma Daughter   . Cardiomyopathy Daughter        related to treatment for lymphoma  . Thyroid cancer Daughter   . Heart disease Daughter        congenital ASD, s/p repair  . Cancer Daughter        Hodkin's; thyroid CA; lymphoma; mesothelioma 10/2015  . Mesothelioma Daughter   . Heart disease Brother        arrhythmia  . Prostate cancer Paternal Grandfather   . Diabetes Neg Hx   . Colon cancer Neg Hx   . Colon polyps Neg Hx   . Esophageal cancer Neg Hx   . Rectal cancer Neg Hx   . Stomach cancer Neg Hx    Past Surgical History:  Procedure Laterality Date  . CARDIAC CATHETERIZATION  05/29/14  . COLONOSCOPY  2005, 10/2015  . CORONARY ANGIOPLASTY WITH STENT PLACEMENT  06/2009   "1"  . INGUINAL HERNIA REPAIR Left 10/1998  . LEFT HEART CATHETERIZATION WITH CORONARY ANGIOGRAM N/A 2014/05/29   Procedure: LEFT HEART CATHETERIZATION WITH CORONARY ANGIOGRAM;  Surgeon: Candee Furbish, MD;  Location: [facility] CATH LAB;  Service: Cardiovascular;  Laterality: N/A;  . PERCUTANEOUS CORONARY STENT INTERVENTION (PCI-S) N/A 05/24/2014    Procedure: PERCUTANEOUS CORONARY STENT INTERVENTION (PCI-S);  Surgeon: Sinclair Grooms, MD;  Location: [facility] CATH LAB;  Service: Cardiovascular;  Laterality: N/A;  . POLYPECTOMY    . RADIOACTIVE SEED IMPLANT  01/2008   prostate cancer  . VASECTOMY     Social History   Social History Narrative   Married. Lives with wife. Daughter passed away 2018/05/29 (mesothelioma).   Other daughter lives in [place] with 3 add'l grandchildren.   5 grandchildren (locally) and one in [place].   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   Immunization History  Administered Date(s) Administered  . Influenza Split 11/03/2011, 10/29/2012  . Influenza, High Dose Seasonal PF 09/03/2016, 10/28/2018  . Influenza,inj,Quad PF,6+ Mos 10/26/2013, 08/30/2014  . Influenza-Unspecified 10/16/2015, 10/17/2017  . Pneumococcal Conjugate-13 11/29/2015  .  Pneumococcal Polysaccharide-23 12/03/2016  . Td 11/26/2005  . Tdap 11/03/2011  . Zoster 02/28/2014     Objective: Vital Signs: BP 123/70 (BP Location: Right Arm, Patient Position: Sitting, Cuff Size: Normal)   Pulse 60   Resp 14   Ht 5' 10.75" (1.797 m)   Wt 204 lb (92.5 kg)   BMI 28.65 kg/m    Physical Exam Vitals signs and nursing note reviewed.  Constitutional:      Appearance: He is well-developed.  HENT:     Head: Normocephalic and atraumatic.  Eyes:     Conjunctiva/sclera: Conjunctivae normal.     Pupils: Pupils are equal, round, and reactive to light.  Neck:     Musculoskeletal: Normal range of motion and neck supple.  Cardiovascular:     Rate and Rhythm: Normal rate and regular rhythm.     Heart sounds: Normal heart sounds.  Pulmonary:     Effort: Pulmonary effort is normal.     Breath sounds: Normal breath sounds.  Abdominal:     General: Bowel sounds are normal.     Palpations: Abdomen is soft.  Skin:    General: Skin is warm and dry.     Capillary Refill: Capillary refill takes less than 2 seconds.  Neurological:     Mental Status: He  is alert and oriented to person, place, and time.  Psychiatric:        Behavior: Behavior normal.      Musculoskeletal Exam: C-spine thoracic and lumbar spine good range of motion.  Shoulder joints elbow joints wrist joints with good range of motion.  He has mild DIP and PIP thickening but no synovitis was noted.  Hip joints knee joints ankles MTPs PIPs with good range of motion with no synovitis.  CDAI Exam: CDAI Score: Not documented Patient Global Assessment: Not documented; Provider Global Assessment: Not documented Swollen: Not documented; Tender: Not documented Joint Exam   Not documented   There is currently no information documented on the homunculus. Go to the Rheumatology activity and complete the homunculus joint exam.  Investigation: Findings:  01/19/19: sed rate 110, CK-MB 1.3, ANA- 01/31/19: sed rate 23, CK 32 02/07/19: sed rate 66, CK 42   Component     Latest Ref Rng & Units 01/19/2019  Sed Rate     0 - 30 mm/hr 110 (H)  CK-MB Index     0.0 - 10.4 ng/mL 1.3  Anti Nuclear Antibody(ANA)     Negative Negative   Component     Latest Ref Rng & Units 01/31/2019 02/07/2019  Sed Rate     0 - 30 mm/hr 23 66 (H)  CK Total     24 - 204 U/L 32 42   Imaging: No results found.  Recent Labs: Lab Results  Component Value Date   WBC 11.4 (H) 01/19/2019   HGB 12.8 (L) 01/19/2019   PLT 403 01/19/2019   NA 140 01/19/2019   K 5.1 01/19/2019   CL 101 01/19/2019   CO2 20 01/19/2019   GLUCOSE 98 01/19/2019   BUN 22 01/19/2019   CREATININE 1.25 01/19/2019   BILITOT 0.6 01/19/2019   ALKPHOS 75 01/19/2019   AST 20 01/19/2019   ALT 17 01/19/2019   PROT 7.0 01/19/2019   ALBUMIN 4.0 01/19/2019   CALCIUM 9.3 01/19/2019   GFRAA 68 01/19/2019    Speciality Comments: No specialty comments available.  Procedures:  No procedures performed Allergies: Imdur [isosorbide mononitrate]   Assessment / Plan:  Visit Diagnoses: Polymyalgia rheumatica ([place]) -patient symptoms are  typical of polymyalgia rheumatica.  He had difficulty raising his arms and getting up from the chair.  He responded very well to prednisone.  Although the differential will be inflammatory arthritis as well.  He had no synovitis on examination.  Plan: CK, TSH, Sedimentation rate  Bilateral shoulder pain, unspecified chronicity -patient has been having pain and discomfort in his bilateral shoulders which responded very well to prednisone.  He had no discomfort today with range of motion.  I will obtain following labs.- Plan: Rheumatoid factor, Cyclic citrul peptide antibody, IgG, 14-3-3 eta Protein  Bilateral hip pain-he also gives history of bilateral hip pain.  He was having difficulty getting out of the chair.  Today he had no difficulty getting out of chair.  On prednisone therapy-he is currently on prednisone 20 mg p.o. daily.  I would like to reduce prednisone to 15 mg p.o. daily physician sed rate today is normal.  H side effects of prednisone were discussed at length.  Elevated sed rate - 01/19/19: sed rate 110, CK-MB 1.3, ANA-01/31/19: sed rate 23, CK 322/10/20: sed rate 66, CK 42  High risk medication use -I discussed that possibly we can use methotrexate low-dose as steroid sparing therapy and it will help [name] to taper prednisone.  That will be discussed after the labs obtained today.  Plan: BASIC METABOLIC PANEL WITH GFR, Hepatitis B core antibody, IgM, Hepatitis B surface antigen, Hepatitis C antibody, HIV Antibody (routine testing w rflx), Serum protein electrophoresis with reflex, QuantiFERON-TB Gold Plus, IgG, IgA, IgM.  Patient has been advised to get the following vaccines which include pneumococcal vaccine, Shingrix vaccine and the flu vaccine.  Vitamin D deficiency -patient has history of vitamin D deficiency.  As he will be taking long-term prednisone he will need vitamin D levels in therapeutic range.  We may also consider getting a bone density once the the coronavirus pandemic  subsides.  Plan: VITAMIN D 25 Hydroxy (Vit-D Deficiency, Fractures)  Other medical problems are listed as follows:  Elevated cholesterol  Essential hypertension, benign  Atherosclerosis of native coronary artery of native heart without angina pectoris  Ischemic cardiomyopathy  Abdominal aortic atherosclerosis ([facility])  Prostate cancer ([facility])  History of gastroesophageal reflux (GERD)  History of diverticulosis  Hx of colonic polyps  Anxiety and depression   Orders: Orders Placed This Encounter  Procedures  . BASIC METABOLIC PANEL WITH GFR  . CK  . TSH  . Sedimentation rate  . VITAMIN D 25 Hydroxy (Vit-D Deficiency, Fractures)  . Rheumatoid factor  . Cyclic citrul peptide antibody, IgG  . 14-3-3 eta Protein  . Hepatitis B core antibody, IgM  . Hepatitis B surface antigen  . Hepatitis C antibody  . HIV Antibody (routine testing w rflx)  . Serum protein electrophoresis with reflex  . QuantiFERON-TB Gold Plus  . IgG, IgA, IgM   Meds ordered this encounter  Medications  . predniSONE (DELTASONE) 5 MG tablet    Sig: Take 3 tablets by mouth daily x 1 month, then 2 tablets by mouth daily x 1 month.    Dispense:  150 tablet    Refill:  0    Face-to-face time spent with patient was 50 minutes. Greater than 50% of time was spent in counseling and coordination of care.  Follow-Up Instructions: Return in about 2 months (around 05/23/2019) for Polymyalgia rheumatica.   Bo Merino, MD  Note - This record has been created using Editor, commissioning.  Chart creation errors have been sought, but may not always  have been located. Such creation errors do not reflect on  the standard of medical care.

## 2019-03-23 ENCOUNTER — Encounter: Payer: Self-pay | Admitting: Rheumatology

## 2019-03-23 ENCOUNTER — Other Ambulatory Visit: Payer: Self-pay

## 2019-03-23 ENCOUNTER — Ambulatory Visit: Payer: PPO | Admitting: Rheumatology

## 2019-03-23 VITALS — BP 123/70 | HR 60 | Resp 14 | Ht 70.75 in | Wt 204.0 lb

## 2019-03-23 DIAGNOSIS — I10 Essential (primary) hypertension: Secondary | ICD-10-CM

## 2019-03-23 DIAGNOSIS — F329 Major depressive disorder, single episode, unspecified: Secondary | ICD-10-CM

## 2019-03-23 DIAGNOSIS — E559 Vitamin D deficiency, unspecified: Secondary | ICD-10-CM

## 2019-03-23 DIAGNOSIS — M25512 Pain in left shoulder: Secondary | ICD-10-CM

## 2019-03-23 DIAGNOSIS — M25511 Pain in right shoulder: Secondary | ICD-10-CM

## 2019-03-23 DIAGNOSIS — Z860100 Personal history of colon polyps, unspecified: Secondary | ICD-10-CM

## 2019-03-23 DIAGNOSIS — M25552 Pain in left hip: Secondary | ICD-10-CM

## 2019-03-23 DIAGNOSIS — Z7952 Long term (current) use of systemic steroids: Secondary | ICD-10-CM

## 2019-03-23 DIAGNOSIS — M353 Polymyalgia rheumatica: Secondary | ICD-10-CM

## 2019-03-23 DIAGNOSIS — E7800 Pure hypercholesterolemia, unspecified: Secondary | ICD-10-CM | POA: Diagnosis not present

## 2019-03-23 DIAGNOSIS — I700 Atherosclerosis of aorta: Secondary | ICD-10-CM

## 2019-03-23 DIAGNOSIS — C61 Malignant neoplasm of prostate: Secondary | ICD-10-CM

## 2019-03-23 DIAGNOSIS — M25551 Pain in right hip: Secondary | ICD-10-CM | POA: Diagnosis not present

## 2019-03-23 DIAGNOSIS — R700 Elevated erythrocyte sedimentation rate: Secondary | ICD-10-CM

## 2019-03-23 DIAGNOSIS — I255 Ischemic cardiomyopathy: Secondary | ICD-10-CM | POA: Diagnosis not present

## 2019-03-23 DIAGNOSIS — I2510 Atherosclerotic heart disease of native coronary artery without angina pectoris: Secondary | ICD-10-CM

## 2019-03-23 DIAGNOSIS — Z79899 Other long term (current) drug therapy: Secondary | ICD-10-CM | POA: Diagnosis not present

## 2019-03-23 DIAGNOSIS — F419 Anxiety disorder, unspecified: Secondary | ICD-10-CM

## 2019-03-23 DIAGNOSIS — Z8719 Personal history of other diseases of the digestive system: Secondary | ICD-10-CM

## 2019-03-23 MED ORDER — PREDNISONE 5 MG PO TABS: ORAL_TABLET | ORAL | 0 refills | Status: DC

## 2019-03-23 NOTE — Patient Instructions (Signed)
Please decrease prednisone to 15 mg every morning if your sedimentation rate comes normal.  We will continue prednisone 15 mg every day for 1 month.  If he develops no increased pain or stiffness he will decrease prednisone to 10 mg daily in the morning for 1 month.  We will see you back in 2 months.  The plan is to decrease prednisone by 1 mg every month after that if you stay symptom-free.  Please make sure that you up-to-date on the following vaccinations: Pneumococcal vaccine Shingrix vaccine Flu vaccine

## 2019-03-24 NOTE — Progress Notes (Signed)
D deficiency.  Please advise patient to take vitamin D 2000 units daily.  Vitamin D level can be checked and 6 months again.

## 2019-03-26 LAB — IGG, IGA, IGM
IgG (Immunoglobin G), Serum: 825 mg/dL (ref 600–1540)
IgM, Serum: 49 mg/dL — ABNORMAL LOW (ref 50–300)
Immunoglobulin A: 91 mg/dL (ref 70–320)

## 2019-03-26 LAB — BASIC METABOLIC PANEL WITH GFR
BUN: 22 mg/dL (ref 7–25)
CO2: 26 mmol/L (ref 20–32)
Calcium: 9.7 mg/dL (ref 8.6–10.3)
Chloride: 104 mmol/L (ref 98–110)
Creat: 1.19 mg/dL (ref 0.70–1.25)
GFR, Est African American: 72 mL/min/{1.73_m2} (ref >=60)
GFR, Est Non African American: 62 mL/min/{1.73_m2} (ref >=60)
Glucose, Bld: 88 mg/dL (ref 65–99)
Potassium: 4.3 mmol/L (ref 3.5–5.3)
Sodium: 138 mmol/L (ref 135–146)

## 2019-03-26 LAB — QUANTIFERON-TB GOLD PLUS
Mitogen-NIL: >10 IU/mL
NIL: 0.02 IU/mL
QuantiFERON-TB Gold Plus: NEGATIVE
TB1-NIL: 0 IU/mL
TB2-NIL: 0 IU/mL

## 2019-03-26 LAB — RHEUMATOID FACTOR: Rheumatoid fact SerPl-aCnc: <14 IU/mL (ref <14)

## 2019-03-26 LAB — TSH: TSH: 1.17 m[IU]/L (ref 0.40–4.50)

## 2019-03-26 LAB — HEPATITIS C ANTIBODY
Hepatitis C Ab: NONREACTIVE
SIGNAL TO CUT-OFF: 0.01 (ref <1.00)

## 2019-03-26 LAB — PROTEIN ELECTROPHORESIS, SERUM, WITH REFLEX
ALPHA 2: 0.8 g/dL (ref 0.5–0.9)
Albumin ELP: 4 g/dL (ref 3.8–4.8)
Alpha 1: 0.2 g/dL (ref 0.2–0.3)
Beta 2: 0.3 g/dL (ref 0.2–0.5)
Beta Globulin: 0.4 g/dL (ref 0.4–0.6)
GAMMA GLOBULIN: 0.7 g/dL — AB (ref 0.8–1.7)
Total Protein: 6.4 g/dL (ref 6.1–8.1)

## 2019-03-26 LAB — CYCLIC CITRUL PEPTIDE ANTIBODY, IGG: Cyclic Citrullin Peptide Ab: <16 UNITS

## 2019-03-26 LAB — VITAMIN D 25 HYDROXY (VIT D DEFICIENCY, FRACTURES): Vit D, 25-Hydroxy: 29 ng/mL — ABNORMAL LOW (ref 30–100)

## 2019-03-26 LAB — 14-3-3 ETA PROTEIN: 14-3-3 eta Protein: <0.2 ng/mL (ref <0.2)

## 2019-03-26 LAB — HEPATITIS B CORE ANTIBODY, IGM: Hep B C IgM: NONREACTIVE

## 2019-03-26 LAB — HEPATITIS B SURFACE ANTIGEN: Hepatitis B Surface Ag: NONREACTIVE

## 2019-03-26 LAB — CK: Total CK: 42 U/L — ABNORMAL LOW (ref 44–196)

## 2019-03-26 LAB — HIV ANTIBODY (ROUTINE TESTING W REFLEX): HIV 1&2 Ab, 4th Generation: NONREACTIVE

## 2019-03-26 LAB — SEDIMENTATION RATE: Sed Rate: 2 mm/h (ref 0–20)

## 2019-03-28 NOTE — Progress Notes (Signed)
All other labs are within normal limits.  He should continue with the prednisone taper as discussed.

## 2019-04-20 ENCOUNTER — Ambulatory Visit: Payer: PPO | Admitting: Rheumatology

## 2019-05-06 ENCOUNTER — Other Ambulatory Visit: Payer: Self-pay

## 2019-05-06 ENCOUNTER — Encounter: Payer: Self-pay | Admitting: Cardiology

## 2019-05-06 ENCOUNTER — Telehealth (INDEPENDENT_AMBULATORY_CARE_PROVIDER_SITE_OTHER): Payer: PPO | Admitting: Cardiology

## 2019-05-06 VITALS — BP 145/73 | HR 61 | Ht 71.0 in | Wt 208.0 lb

## 2019-05-06 DIAGNOSIS — I2510 Atherosclerotic heart disease of native coronary artery without angina pectoris: Secondary | ICD-10-CM | POA: Diagnosis not present

## 2019-05-06 DIAGNOSIS — I10 Essential (primary) hypertension: Secondary | ICD-10-CM

## 2019-05-06 DIAGNOSIS — E7800 Pure hypercholesterolemia, unspecified: Secondary | ICD-10-CM

## 2019-05-06 DIAGNOSIS — I2583 Coronary atherosclerosis due to lipid rich plaque: Secondary | ICD-10-CM | POA: Diagnosis not present

## 2019-05-06 NOTE — Progress Notes (Signed)
Virtual Visit via Video Note   This visit type was conducted due to national recommendations for restrictions regarding the COVID-19 Pandemic (e.g. social distancing) in an effort to limit this patient's exposure and mitigate transmission in our community.  Due to his co-morbid illnesses, this patient is at least at moderate risk for complications without adequate follow up.  This format is felt to be most appropriate for this patient at this time.  All issues noted in this document were discussed and addressed.  A limited physical exam was performed with this format.  Please refer to the patient's chart for his consent to telehealth for [facility].   Date:  05/06/2019   ID:  [name], DOB 08/29/1951, MRN [id number]  Patient Location: Home Provider Location: Home  PCP:  Rita Ohara, MD  Cardiologist:  Candee Furbish, MD [facility] Electrophysiologist:  None   Evaluation Performed:  Follow-Up Visit  Chief Complaint:  CAD follow-up  History of Present Illness:    [name] is a 68 y.o. male with coronary artery disease with stents to his LAD in 2010 and 2015 with hyperlipidemia anxiety here for follow-up.  Swelling in feet has been chronic.  PMR - 20 to 10mg  day. Has helped.   LDL 38-excellent on atorvastatin, no myalgias  The patient does not have symptoms concerning for COVID-19 infection (fever, chills, cough, or new shortness of breath).    Past Medical History:  Diagnosis Date  . Allergy   . Anxiety   . Arthritis    hips  . CAD (coronary artery disease) 2010, 2015 stents   a. s/p DES to LAD 06/2009. b. LHC (05/23/14): LAD proximal to previously placed stent  30%, mid LAD distal to the stent 99%, CFX and RCA No CAD EF 50% with ant HK. PCI:  18 x 2.5 mm diameter Xience Alpine DES to mid LAD.  Marland Kitchen Colon polyps 10/2015   tubular adeomas x 4  . Depression   . Diverticulosis 10/2015   noted on colonoscopy  . ED (erectile dysfunction)   . GERD (gastroesophageal  reflux disease)   . Hyperlipidemia   . Hypertension   . Inguinal hernia, left 08/2014   seen on CT (containing peritoneal fat)  . Internal hemorrhoids 10/2015   noted on colonoscopy  . Irregular heartbeat    started on beta blocker by Dr. Marlou Porch, improved  . Kidney stone 03/2010   08/2014-distal R ureter  . Myocardial infarction ([place])    2010  . Prostate cancer ([place]) 01/2008    s/p brachiotherapy; Dr. Karsten Ro  . Shingles 2010   Past Surgical History:  Procedure Laterality Date  . CARDIAC CATHETERIZATION  05/23/2014  . COLONOSCOPY  2005, 10/2015  . CORONARY ANGIOPLASTY WITH STENT PLACEMENT  06/2009   "1"  . INGUINAL HERNIA REPAIR Left 10/1998  . LEFT HEART CATHETERIZATION WITH CORONARY ANGIOGRAM N/A 05/23/2014   Procedure: LEFT HEART CATHETERIZATION WITH CORONARY ANGIOGRAM;  Surgeon: Candee Furbish, MD;  Location: [facility] CATH LAB;  Service: Cardiovascular;  Laterality: N/A;  . PERCUTANEOUS CORONARY STENT INTERVENTION (PCI-S) N/A 05/24/2014   Procedure: PERCUTANEOUS CORONARY STENT INTERVENTION (PCI-S);  Surgeon: Sinclair Grooms, MD;  Location: [facility] CATH LAB;  Service: Cardiovascular;  Laterality: N/A;  . POLYPECTOMY    . RADIOACTIVE SEED IMPLANT  01/2008   prostate cancer  . VASECTOMY       Current Meds  Medication Sig  . aspirin 81 MG tablet Take 81 mg by mouth daily.    Marland Kitchen  atorvastatin (LIPITOR) 40 MG tablet Take 1 tablet (40 mg total) by mouth daily.  . carvedilol (COREG) 3.125 MG tablet TAKE 1 TABLET TWICE A DAY (KEEP UPCOMING APPOINTMENT FOR FUTURE REFILLS)  . cholecalciferol (VITAMIN D) 1000 units tablet Take 2,000 Units by mouth daily.   . citalopram (CELEXA) 20 MG tablet Take 1 tablet (20 mg total) by mouth daily.  . diphenhydrAMINE (BENADRYL) 25 MG tablet Take 25 mg by mouth every evening.   . fish oil-omega-3 fatty acids 1000 MG capsule Take 1 g by mouth daily.    Marland Kitchen lisinopril (PRINIVIL,ZESTRIL) 20 MG tablet Take 1 tablet (20 mg total) by mouth daily.  . pantoprazole (PROTONIX) 40  MG tablet TAKE 1 TABLET (40 MG TOTAL) BY MOUTH DAILY. PLEASE KEEP UPCOMING APPT FOR FUTURE REFILLS  . predniSONE (DELTASONE) 5 MG tablet Take 3 tablets by mouth daily x 1 month, then 2 tablets by mouth daily x 1 month.  . sildenafil (REVATIO) 20 MG tablet Take 20 mg by mouth as needed.   . triamcinolone cream (KENALOG) 0.1 % Apply sparingly to affected areas of skin twice daily for up to 2 weeks.   Current Facility-Administered Medications for the 05/06/19 encounter (Telemedicine) with Jerline Pain, MD  Medication  . 0.9 %  sodium chloride infusion     Allergies:   Imdur [isosorbide mononitrate]   Social History   Tobacco Use  . Smoking status: Former Smoker    Types: Cigarettes, Pipe, Cigars    Last attempt to quit: 12/29/1976    Years since quitting: 42.3  . Smokeless tobacco: Never Used  . Tobacco comment: distant tobacco h/o in college (2-3 packs/week); +pipe/cigar use occ  x 15 years; quit  in the 1990's  Substance Use Topics  . Alcohol use: Yes    Alcohol/week: 5.0 standard drinks    Types: 5 Cans of beer per week    Comment: 5 beers per week (1-2 max at a time)  . Drug use: No     Family Hx: The patient's family history includes Bladder Cancer in his mother; Cancer in his daughter; Cardiomyopathy in his daughter; Dementia in his father; Eating disorder in his mother; Heart disease in his brother and daughter; Heart disease (age of onset: 80) in his father; Hodgkin's lymphoma in his daughter; Hyperlipidemia in his father; Hypertension in his father; Mesothelioma in his daughter; Prostate cancer in his paternal grandfather; Prostate cancer (age of onset: 6) in his father; Thyroid cancer in his daughter. There is no history of Diabetes, Colon cancer, Colon polyps, Esophageal cancer, Rectal cancer, or Stomach cancer.  ROS:   Please see the history of present illness.    Denies any fevers chills nausea vomiting syncope bleeding orthopnea PND.  No chest pain. All other systems  reviewed and are negative.   Prior CV studies:   The following studies were reviewed today:  - LHC (05/23/14): LAD proximal to previously placed stent 30%, mid LAD distal to the stent 99%, CFX and RCA No CAD EF 50% with ant HK. PCI: 18 x 2.5 mm diameter Xience Alpine DES to mid LAD.  - Nuclear (04/26/14): High risk stress nuclear study . There is a small but severe area of ischemia in the distal anterior wall. EF 46%. Ant HK.   Labs/Other Tests and Data Reviewed:    EKG:  An ECG dated 05/06/2018 was personally reviewed today and demonstrated:  Sinus rhythm 61 bpm no changes  Recent Labs: 01/19/2019: ALT 17; Hemoglobin 12.8; Platelets 403  03/23/2019: BUN 22; Creat 1.19; Potassium 4.3; Sodium 138; TSH 1.17   Recent Lipid Panel Lab Results  Component Value Date/Time   CHOL 95 (L) 01/19/2019 09:40 AM   TRIG 82 01/19/2019 09:40 AM   HDL 41 01/19/2019 09:40 AM   CHOLHDL 2.3 01/19/2019 09:40 AM   CHOLHDL 3.2 11/27/2016 08:39 AM   LDLCALC 38 01/19/2019 09:40 AM    Wt Readings from Last 3 Encounters:  05/06/19 208 lb (94.3 kg)  03/23/19 204 lb (92.5 kg)  02/07/19 201 lb (91.2 kg)     Objective:    Vital Signs:  BP (!) 145/73 (BP Location: Left Arm, Patient Position: Sitting, Cuff Size: Normal)   Pulse 61   Ht 5\' 11"  (1.803 m)   Wt 208 lb (94.3 kg)   BMI 29.01 kg/m    VITAL SIGNS:  reviewed GEN:  no acute distress EYES:  sclerae anicteric, EOMI - Extraocular Movements Intact RESPIRATORY:  normal respiratory effort, symmetric expansion SKIN:  no rash, lesions or ulcers. MUSCULOSKELETAL:  no obvious deformities. NEURO:  alert and oriented x 3, no obvious focal deficit PSYCH:  normal affect  ASSESSMENT & PLAN:    Coronary artery disease - LAD stents 2010 in 2015.  Doing well.  No anginal symptoms currently.  In the past felt mild chest discomfort during exercise.  Continue with aggressive secondary risk factor prevention.  Medications as above.  Essential hypertension -  Carvedilol lisinopril.  Labile in the past.  Doing well.  Most of the time it is higher at home than it is at the office.  Question cuff.  Hyperlipidemia -Atorvastatin 40 mg high intensity dose, prior LDL 38.  No myalgias no side effects.  Excellent protection  Polymyalgia rheumatica -Most recent sed rate very low.  Excellent.  On prednisone 10 mg a day.  Take with food.  Seen by rheumatology.  COVID-19 Education: The signs and symptoms of COVID-19 were discussed with the patient and how to seek care for testing (follow up with PCP or arrange E-visit).  The importance of social distancing was discussed today.  Time:   Today, I have spent 15 minutes with the patient with telehealth technology discussing the above problems.     Medication Adjustments/Labs and Tests Ordered: Current medicines are reviewed at length with the patient today.  Concerns regarding medicines are outlined above.   Tests Ordered: No orders of the defined types were placed in this encounter.   Medication Changes: No orders of the defined types were placed in this encounter.   Disposition:  Follow up in 1 year(s)  Signed, Candee Furbish, MD  05/06/2019 10:01 AM    [facility]

## 2019-05-06 NOTE — Patient Instructions (Signed)
Medication Instructions:  Your physician recommends that you continue on your current medications as directed. Please refer to the Current Medication list given to you today.  If you need a refill on your cardiac medications before your next appointment, please call your pharmacy.   Lab work: None Ordered   Testing/Procedures: None Ordered  Follow-Up: At [facility], you and your health needs are our priority.  As part of our continuing mission to provide you with exceptional heart care, we have created designated Provider Care Teams.  These Care Teams include your primary Cardiologist (physician) and Advanced Practice Providers (APPs -  Physician Assistants and Nurse Practitioners) who all work together to provide you with the care you need, when you need it. You will need a follow up appointment in 1 years.  Please call our office 2 months in advance to schedule this appointment.  You may see Dr.Skains or one of the following Advanced Practice Providers on your designated Care Team:   Truitt Merle, NP Cecilie Kicks, NP . Kathyrn Drown, NP

## 2019-05-12 NOTE — Progress Notes (Signed)
Office Visit Note  Patient: [name]             Date of Birth: 19-Jun-1951           MRN: [id number]             PCP: Rita Ohara, MD Referring: Rita Ohara, MD Visit Date: 05/24/2019 Occupation: @GUAROCC @  Subjective:  Medication management  History of Present Illness: [name] is a 68 y.o. male with history of polymyalgia rheumatica.  He is currently taking Prednisone 10 mg po daily for the last 2 weeks.  He finished a course of prednisone 15 mg p.o. daily for a month.  He states he has occasional left shoulder joint pain.  Currently he is not having any discomfort.  He denies any joint swelling.  Activities of Daily Living:  Patient reports morning stiffness for 30 minutes.   Patient Denies nocturnal pain.  Difficulty dressing/grooming: Denies Difficulty climbing stairs: Denies Difficulty getting out of chair: Denies Difficulty using hands for taps, buttons, cutlery, and/or writing: Denies  Review of Systems  Constitutional: Negative for fatigue and night sweats.  HENT: Negative for mouth sores, mouth dryness and nose dryness.   Eyes: Negative for pain, redness, itching and dryness.  Respiratory: Negative for cough, hemoptysis, shortness of breath, wheezing and difficulty breathing.   Cardiovascular: Negative for chest pain, palpitations, hypertension, irregular heartbeat and swelling in legs/feet.  Gastrointestinal: Negative for abdominal pain, blood in stool, constipation and diarrhea.  Endocrine: Negative for increased urination.  Genitourinary: Negative for painful urination and pelvic pain.  Musculoskeletal: Positive for morning stiffness. Negative for arthralgias, joint pain, joint swelling, myalgias, muscle weakness, muscle tenderness and myalgias.  Skin: Negative for color change, rash, hair loss, nodules/bumps, redness, skin tightness, ulcers and sensitivity to sunlight.  Allergic/Immunologic: Negative for susceptible to infections.  Neurological:  Negative for dizziness, fainting, light-headedness, headaches, memory loss, night sweats and weakness.  Hematological: Negative for swollen glands.  Psychiatric/Behavioral: Negative for depressed mood, confusion and sleep disturbance. The patient is not nervous/anxious.     PMFS History:  Patient Active Problem List   Diagnosis Date Noted  . Abdominal aortic atherosclerosis ([place]) 12/03/2016  . 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 Vitamin D deficiency 11/27/2015  . Atherosclerosis of native coronary artery of native heart without angina pectoris 09/21/2014  . Essential hypertension, benign 09/21/2014  . Chest pain 05/25/2014  . Ischemic cardiomyopathy   . Hypertension   . Abnormal nuclear stress test 05/18/2014  . 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 Pure hypercholesterolemia 11/03/2011  . CAD (coronary artery disease) 11/03/2011  . Elevated cholesterol 02/17/2011  . GERD (gastroesophageal reflux disease) 02/17/2011  . Prostate cancer ([place]) 09/29/2007    Past Medical History:  Diagnosis Date  . Allergy   . Anxiety   . Arthritis    hips  . CAD (coronary artery disease) 2010, 2015 stents   a. s/p DES to LAD 06/2009. b. LHC (05/23/14): LAD proximal to previously placed stent  30%, mid LAD distal to the stent 99%, CFX and RCA No CAD EF 50% with ant HK. PCI:  18 x 2.5 mm diameter Xience Alpine DES to mid LAD.  Marland Kitchen Colon polyps 10/2015   tubular adeomas x 4  . Depression   . Diverticulosis 10/2015   noted on colonoscopy  . ED (erectile dysfunction)   . GERD (gastroesophageal reflux disease)   . Hyperlipidemia   . Hypertension   . Inguinal hernia, left 08/2014   seen on CT (  containing peritoneal fat)  . Internal hemorrhoids 10/2015   noted on colonoscopy  . Irregular heartbeat    started on beta blocker by Dr. Marlou Porch, improved  . Kidney stone 03/2010   08/2014-distal R ureter  . Myocardial infarction ([place])    2010  . Prostate cancer ([place]) 01/2008    s/p  brachiotherapy; Dr. Karsten Ro  . Shingles 2010    Family History  Problem Relation Age of Onset  . Heart disease Father 87  . Prostate cancer Father 45  . Hypertension Father   . Hyperlipidemia Father   . Dementia Father   . Eating disorder Mother   . Bladder Cancer Mother        and peritoneal cancer  . Hodgkin's lymphoma Daughter   . Cardiomyopathy Daughter        related to treatment for lymphoma  . Thyroid cancer Daughter   . Heart disease Daughter        congenital ASD, s/p repair  . Cancer Daughter        Hodkin's; thyroid CA; lymphoma; mesothelioma 10/2015  . Mesothelioma Daughter   . Heart disease Brother        arrhythmia  . Prostate cancer Paternal Grandfather   . Diabetes Neg Hx   . Colon cancer Neg Hx   . Colon polyps Neg Hx   . Esophageal cancer Neg Hx   . Rectal cancer Neg Hx   . Stomach cancer Neg Hx    Past Surgical History:  Procedure Laterality Date  . CARDIAC CATHETERIZATION  05/23/2014  . COLONOSCOPY  2005, 10/2015  . CORONARY ANGIOPLASTY WITH STENT PLACEMENT  06/2009   "1"  . INGUINAL HERNIA REPAIR Left 10/1998  . LEFT HEART CATHETERIZATION WITH CORONARY ANGIOGRAM N/A 05/23/2014   Procedure: LEFT HEART CATHETERIZATION WITH CORONARY ANGIOGRAM;  Surgeon: Candee Furbish, MD;  Location[facility] C[facility] LAB;  Service: Cardiovascular;  Laterality: N/A;  . PERCUTANEOUS CORONARY STENT INTERVENTION (PCI-S) N/A 05/24/2014   Procedure: PERCUTANEOUS CORONARY STENT INTERVENTION (PCI-S);  Surgeon: Sinclair Grooms, MD;  Location: [facility] CATH LAB;  Service: Cardiovascular;  Laterality: N/A;  . POLYPECTOMY    . RADIOACTIVE SEED IMPLANT  01/2008   prostate cancer  . VASECTOMY     Social History   Social History Narrative   Married. Lives with wife. Daughter passed away Jun 10, 2018 (mesothelioma).   Other daughter lives in [place] with 3 add'l grandchildren.   5 grandchildren (locally) and one in [place].   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   Immunization History   Administered Date(s) Administered  . Influenza Split 11/03/2011, 10/29/2012  . Influenza, High Dose Seasonal PF 09/03/2016, 10/28/2018  . Influenza,inj,Quad PF,6+ Mos 10/26/2013, 08/30/2014  . Influenza-Unspecified 10/16/2015, 10/17/2017  . Pneumococcal Conjugate-13 11/29/2015  . Pneumococcal Polysaccharide-23 12/03/2016  . Td 11/26/2005  . Tdap 11/03/2011  . Zoster 02/28/2014     Objective: Vital Signs: BP 120/68 (BP Location: Left Arm, Patient Position: Sitting, Cuff Size: Normal)   Pulse 73   Resp 14   Ht 5\' 11"  (1.803 m)   Wt 213 lb (96.6 kg)   BMI 29.71 kg/m    Physical Exam Vitals signs and nursing note reviewed.  Constitutional:      Appearance: He is well-developed.  HENT:     Head: Normocephalic and atraumatic.  Eyes:     Conjunctiva/sclera: Conjunctivae normal.     Pupils: Pupils are equal, round, and reactive to light.  Neck:     Musculoskeletal: Normal range of motion and neck  supple.  Cardiovascular:     Rate and Rhythm: Normal rate and regular rhythm.     Heart sounds: Normal heart sounds.  Pulmonary:     Effort: Pulmonary effort is normal.     Breath sounds: Normal breath sounds.  Abdominal:     General: Bowel sounds are normal.     Palpations: Abdomen is soft.  Lymphadenopathy:     Cervical: No cervical adenopathy.  Skin:    General: Skin is warm and dry.     Capillary Refill: Capillary refill takes less than 2 seconds.  Neurological:     Mental Status: He is alert and oriented to person, place, and time.  Psychiatric:        Behavior: Behavior normal.      Musculoskeletal Exam: Single lumbar spine good range of motion.  Shoulder joints elbow joints wrist joint MCPs PIPs and DIPs with good range of motion.  Hip joints, knee joints, ankles MTPs PIPs with good range of motion.  He had no muscular weakness or tenderness on examination.  CDAI Exam: CDAI Score: Not documented Patient Global Assessment: Not documented; Provider Global Assessment:  Not documented Swollen: Not documented; Tender: Not documented Joint Exam   Not documented   There is currently no information documented on the homunculus. Go to the Rheumatology activity and complete the homunculus joint exam.  Investigation: No additional findings.  Imaging: No results found.  Recent Labs: Lab Results  Component Value Date   WBC 11.4 (H) 01/19/2019   HGB 12.8 (L) 01/19/2019   PLT 403 01/19/2019   NA 138 03/23/2019   K 4.3 03/23/2019   CL 104 03/23/2019   CO2 26 03/23/2019   GLUCOSE 88 03/23/2019   BUN 22 03/23/2019   CREATININE 1.19 03/23/2019   BILITOT 0.6 01/19/2019   ALKPHOS 75 01/19/2019   AST 20 01/19/2019   ALT 17 01/19/2019   PROT 6.4 03/23/2019   ALBUMIN 4.0 01/19/2019   CALCIUM 9.7 03/23/2019   GFRAA 72 03/23/2019   QFTBGOLDPLUS NEGATIVE 03/23/2019    Speciality Comments: No specialty comments available.  Procedures:  No procedures performed Allergies: Imdur [isosorbide mononitrate]   Assessment / Plan:     Visit Diagnoses: Polymyalgia rheumatica (HCC)-patient had no muscular weakness or tenderness on examination.  He is doing much better on prednisone 10 mg p.o. daily now.  We discussed tapering prednisone by 1 mg p.o. daily.  Have given him prescription for 1 mg tablets.  I had detailed discussion regarding possible use of methotrexate which may help with the prednisone taper but she declined.  On prednisone therapy-he is on chronic prednisone therapy.  I discussed taking calcium and vitamin D and doing resistive exercises.  Side effects of long-term use of prednisone were discussed.  I will also schedule a bone density.  Elevated sed rate-secondary to PMR  Vitamin D deficiency-patient is on vitamin D supplement.  Elevated cholesterol  Essential hypertension, benign-his blood pressure is controlled.  Other medical problems are listed as follows:  Atherosclerosis of native coronary artery of native heart without angina pectoris   Ischemic cardiomyopathy  Abdominal aortic atherosclerosis (HCC)  Prostate cancer (HCC)  History of gastroesophageal reflux (GERD)  History of diverticulosis  Hx of colonic polyps  Anxiety and depression   Orders: Orders Placed This Encounter  Procedures  . DG BONE DENSITY (DXA)   Meds ordered this encounter  Medications  . predniSONE (DELTASONE) 1 MG tablet    Sig: Take 4 tablets (4 mg total) by mouth  daily. Follow taper as instructed.    Dispense:  360 tablet    Refill:  0    Face-to-face time spent with patient was 25 minutes. Greater than 50% of time was spent in counseling and coordination of care.  Follow-Up Instructions: Return in about 4 months (around 09/24/2019) for PMR.   Bo Merino, MD  Note - This record has been created using Editor, commissioning.  Chart creation errors have been sought, but may not always  have been located. Such creation errors do not reflect on  the standard of medical care.

## 2019-05-16 ENCOUNTER — Other Ambulatory Visit: Payer: Self-pay | Admitting: Rheumatology

## 2019-05-17 NOTE — Telephone Encounter (Signed)
Last Visit: 03/23/19 Next visit: 05/24/19   Patient states he is on 10 mg of Prednisone daily and is having discomfort in his left shoulder as well as his right shoulder. Patient states it is worse in the left shoulder. Patient states he has been on the 10 mg of Prednisone for 1 week.

## 2019-05-23 ENCOUNTER — Other Ambulatory Visit: Payer: Self-pay | Admitting: Cardiology

## 2019-05-24 ENCOUNTER — Encounter: Payer: Self-pay | Admitting: Rheumatology

## 2019-05-24 ENCOUNTER — Ambulatory Visit: Payer: PPO | Admitting: Rheumatology

## 2019-05-24 ENCOUNTER — Other Ambulatory Visit: Payer: Self-pay

## 2019-05-24 VITALS — BP 120/68 | HR 73 | Resp 14 | Ht 71.0 in | Wt 213.0 lb

## 2019-05-24 DIAGNOSIS — I255 Ischemic cardiomyopathy: Secondary | ICD-10-CM | POA: Diagnosis not present

## 2019-05-24 DIAGNOSIS — C61 Malignant neoplasm of prostate: Secondary | ICD-10-CM

## 2019-05-24 DIAGNOSIS — E7800 Pure hypercholesterolemia, unspecified: Secondary | ICD-10-CM | POA: Diagnosis not present

## 2019-05-24 DIAGNOSIS — Z860100 Personal history of colon polyps, unspecified: Secondary | ICD-10-CM

## 2019-05-24 DIAGNOSIS — R700 Elevated erythrocyte sedimentation rate: Secondary | ICD-10-CM

## 2019-05-24 DIAGNOSIS — Z8719 Personal history of other diseases of the digestive system: Secondary | ICD-10-CM | POA: Diagnosis not present

## 2019-05-24 DIAGNOSIS — F329 Major depressive disorder, single episode, unspecified: Secondary | ICD-10-CM

## 2019-05-24 DIAGNOSIS — E559 Vitamin D deficiency, unspecified: Secondary | ICD-10-CM

## 2019-05-24 DIAGNOSIS — I700 Atherosclerosis of aorta: Secondary | ICD-10-CM | POA: Diagnosis not present

## 2019-05-24 DIAGNOSIS — I10 Essential (primary) hypertension: Secondary | ICD-10-CM | POA: Diagnosis not present

## 2019-05-24 DIAGNOSIS — I2510 Atherosclerotic heart disease of native coronary artery without angina pectoris: Secondary | ICD-10-CM | POA: Diagnosis not present

## 2019-05-24 DIAGNOSIS — F32A Depression, unspecified: Secondary | ICD-10-CM

## 2019-05-24 DIAGNOSIS — Z7952 Long term (current) use of systemic steroids: Secondary | ICD-10-CM | POA: Diagnosis not present

## 2019-05-24 DIAGNOSIS — F419 Anxiety disorder, unspecified: Secondary | ICD-10-CM

## 2019-05-24 DIAGNOSIS — M353 Polymyalgia rheumatica: Secondary | ICD-10-CM

## 2019-05-24 MED ORDER — PREDNISONE 1 MG PO TABS: 4.0000 mg | ORAL_TABLET | Freq: Every day | ORAL | 0 refills | Status: DC

## 2019-06-03 ENCOUNTER — Other Ambulatory Visit: Payer: Self-pay | Admitting: Cardiology

## 2019-06-15 ENCOUNTER — Other Ambulatory Visit: Payer: Self-pay | Admitting: Rheumatology

## 2019-06-15 NOTE — Telephone Encounter (Signed)
Last Visit: 05/24/2019 Next Visit: 09/20/2019  Okay to refill per Dr. Estanislado Pandy.

## 2019-07-18 DIAGNOSIS — M8589 Other specified disorders of bone density and structure, multiple sites: Secondary | ICD-10-CM | POA: Diagnosis not present

## 2019-07-18 DIAGNOSIS — M353 Polymyalgia rheumatica: Secondary | ICD-10-CM | POA: Diagnosis not present

## 2019-07-18 DIAGNOSIS — Z13820 Encounter for screening for osteoporosis: Secondary | ICD-10-CM | POA: Diagnosis not present

## 2019-07-18 DIAGNOSIS — Z8546 Personal history of malignant neoplasm of prostate: Secondary | ICD-10-CM | POA: Diagnosis not present

## 2019-07-22 ENCOUNTER — Telehealth: Payer: Self-pay | Admitting: *Deleted

## 2019-07-22 NOTE — Telephone Encounter (Signed)
Patient advised Bone Density Scan 07/18/2019 Osteopenia T-Score -1.6 BMD 0.714  Recommendations: Calcium, Vitamin D and resistive exercises. Repeat 2 years.

## 2019-07-22 NOTE — Telephone Encounter (Signed)
Bone Density Scan 07/18/2019 Osteopenia T-Score -1.6 BMD 0.714  Recommendations: Calcium, Vitamin D and resistive exercises. Repeat 2 years.

## 2019-08-01 ENCOUNTER — Other Ambulatory Visit: Payer: Self-pay | Admitting: Cardiology

## 2019-08-12 ENCOUNTER — Other Ambulatory Visit: Payer: Self-pay | Admitting: Rheumatology

## 2019-08-12 NOTE — Telephone Encounter (Signed)
Last Visit: 05/24/2019 Next Visit: 09/20/2019  Patient is currently on 6 mg per day.   Okay to refill per Dr. Estanislado Pandy

## 2019-08-16 ENCOUNTER — Other Ambulatory Visit: Payer: Self-pay | Admitting: Rheumatology

## 2019-08-16 NOTE — Telephone Encounter (Signed)
Last Visit:05/24/2019 Next Visit:09/20/2019  Patient is currently on 6 mg per day.   Okay to refill per Dr. Estanislado Pandy

## 2019-09-06 NOTE — Progress Notes (Deleted)
Virtual Visit via Video Note  I connected with Tedra Coupe on 09/06/19 at  3:45 PM EDT by a video enabled telemedicine application and verified that I am speaking with the correct person using two identifiers.  Location: Patient: *** Provider: *** This service was conducted via virtual visit.  Both audio and visual tools were used.  The patient was located at home. I was located in my office.  Consent was obtained prior to the virtual visit and is aware of possible charges through their insurance for this visit.  The patient is an established patient.  Dr. Estanislado Pandy, MD conducted the virtual visit and Hazel Sams, PA-C acted as scribe during the service.  Office staff helped with scheduling follow up visits after the service was conducted.   I discussed the limitations of evaluation and management by telemedicine and the availability of in person appointments. The patient expressed understanding and agreed to proceed.  CC:   History of Present Illness: Patient is a 68 year old male with a past medical history of PMR.   Review of Systems  Constitutional: Negative for fever and malaise/fatigue.  Eyes: Negative for photophobia, pain, discharge and redness.  Respiratory: Negative for cough, shortness of breath and wheezing.   Cardiovascular: Negative for chest pain and palpitations.  Gastrointestinal: Negative for blood in stool, constipation and diarrhea.  Genitourinary: Negative for dysuria.  Musculoskeletal: Negative for back pain, joint pain, myalgias and neck pain.  Skin: Negative for rash.  Neurological: Negative for dizziness and headaches.  Psychiatric/Behavioral: Negative for depression. The patient is not nervous/anxious and does not have insomnia.       Observations/Objective: Physical Exam  Constitutional: He is oriented to person, place, and time and well-developed, well-nourished, and in no distress.  HENT:  Head: Normocephalic and atraumatic.  Eyes: Conjunctivae  are normal.  Pulmonary/Chest: Effort normal.  Neurological: He is alert and oriented to person, place, and time.  Psychiatric: Mood, memory, affect and judgment normal.   Patient reports morning stiffness for *** {minute/hour:19697}.   Patient {Actions; denies-reports:120008} nocturnal pain.  Difficulty dressing/grooming: {ACTIONS;DENIES/REPORTS:21021675::"Denies"} Difficulty climbing stairs: {ACTIONS;DENIES/REPORTS:21021675::"Denies"} Difficulty getting out of chair: {ACTIONS;DENIES/REPORTS:21021675::"Denies"} Difficulty using hands for taps, buttons, cutlery, and/or writing: {ACTIONS;DENIES/REPORTS:21021675::"Denies"}   Assessment and Plan: Visit Diagnoses: Polymyalgia rheumatica ([place])-  On prednisone therapy-Scheduled DEXA at last visit   Elevated sed rate-secondary to PMR  Vitamin D deficiency-patient is on vitamin D supplement.  Elevated cholesterol  Essential hypertension, benign-his blood pressure is controlled.  Other medical problems are listed as follows:  Atherosclerosis of native coronary artery of native heart without angina pectoris  Ischemic cardiomyopathy  Abdominal aortic atherosclerosis (HCC)  Prostate cancer (HCC)  History of gastroesophageal reflux (GERD)  History of diverticulosis  Hx of colonic polyps  Anxiety and depression    Follow Up Instructions: He will follow up in    I discussed the assessment and treatment plan with the patient. The patient was provided an opportunity to ask questions and all were answered. The patient agreed with the plan and demonstrated an understanding of the instructions.   The patient was advised to call back or seek an in-person evaluation if the symptoms worsen or if the condition fails to improve as anticipated.  I provided *** minutes of non-face-to-face time during this encounter.   Ofilia Neas, PA-C

## 2019-09-19 ENCOUNTER — Other Ambulatory Visit: Payer: Self-pay

## 2019-09-19 ENCOUNTER — Telehealth: Payer: Self-pay

## 2019-09-19 ENCOUNTER — Encounter: Payer: Self-pay | Admitting: Physician Assistant

## 2019-09-19 ENCOUNTER — Telehealth (INDEPENDENT_AMBULATORY_CARE_PROVIDER_SITE_OTHER): Payer: PPO | Admitting: Rheumatology

## 2019-09-19 DIAGNOSIS — Z8719 Personal history of other diseases of the digestive system: Secondary | ICD-10-CM | POA: Diagnosis not present

## 2019-09-19 DIAGNOSIS — I2510 Atherosclerotic heart disease of native coronary artery without angina pectoris: Secondary | ICD-10-CM | POA: Diagnosis not present

## 2019-09-19 DIAGNOSIS — Z5181 Encounter for therapeutic drug level monitoring: Secondary | ICD-10-CM

## 2019-09-19 DIAGNOSIS — R700 Elevated erythrocyte sedimentation rate: Secondary | ICD-10-CM | POA: Diagnosis not present

## 2019-09-19 DIAGNOSIS — C61 Malignant neoplasm of prostate: Secondary | ICD-10-CM | POA: Diagnosis not present

## 2019-09-19 DIAGNOSIS — Z86010 Personal history of colonic polyps: Secondary | ICD-10-CM

## 2019-09-19 DIAGNOSIS — F32A Depression, unspecified: Secondary | ICD-10-CM

## 2019-09-19 DIAGNOSIS — Z7952 Long term (current) use of systemic steroids: Secondary | ICD-10-CM | POA: Diagnosis not present

## 2019-09-19 DIAGNOSIS — M353 Polymyalgia rheumatica: Secondary | ICD-10-CM

## 2019-09-19 DIAGNOSIS — I255 Ischemic cardiomyopathy: Secondary | ICD-10-CM

## 2019-09-19 DIAGNOSIS — I10 Essential (primary) hypertension: Secondary | ICD-10-CM

## 2019-09-19 DIAGNOSIS — F419 Anxiety disorder, unspecified: Secondary | ICD-10-CM

## 2019-09-19 DIAGNOSIS — E7800 Pure hypercholesterolemia, unspecified: Secondary | ICD-10-CM

## 2019-09-19 DIAGNOSIS — F329 Major depressive disorder, single episode, unspecified: Secondary | ICD-10-CM

## 2019-09-19 DIAGNOSIS — E559 Vitamin D deficiency, unspecified: Secondary | ICD-10-CM

## 2019-09-19 DIAGNOSIS — M8589 Other specified disorders of bone density and structure, multiple sites: Secondary | ICD-10-CM | POA: Diagnosis not present

## 2019-09-19 NOTE — Telephone Encounter (Signed)
Per Dr. Estanislado Pandy, patient will be starting Fosamax after lab results. Please refer to note from 09/19/2019.

## 2019-09-19 NOTE — Progress Notes (Signed)
Virtual Visit via Video Note  I connected with [name] on 09/19/19 at  3:40 PM EDT by a video enabled telemedicine application and verified that I am speaking with the correct person using two identifiers.  Location: Patient: Home  Provider: Clinic  This service was conducted via virtual visit.  Both audio and visual tools were used.  The patient was located at home. I was located in my office.  Consent was obtained prior to the virtual visit and is aware of possible charges through their insurance for this visit.  The patient is an established patient.  Dr. Estanislado Pandy, MD conducted the virtual visit and Hazel Sams, PA-C acted as scribe during the service.  Office staff helped with scheduling follow up visits after the service was conducted.   I discussed the limitations of evaluation and management by telemedicine and the availability of in person appointments. The patient expressed understanding and agreed to proceed.  CC: Right arm pain  History of Present Illness: Patient is a 68 year old male with a past medical history of PMR. He is currently taking prednisone 6 mg daily.  He is tapering by 1 mg every month.  He has not had any recent flares.  He has been tolerating prednisone and does not want to start on MTX at this time. He is experiencing pain in the right bicep muscle.  He denies any muscle weakness.  He has no difficulty raising his arms above his head or getting up from a chair.     Review of Systems  Constitutional: Negative for fever and malaise/fatigue.  Eyes: Negative for photophobia, pain, discharge and redness.  Respiratory: Negative for cough, shortness of breath and wheezing.   Cardiovascular: Negative for chest pain and palpitations.  Gastrointestinal: Negative for blood in stool, constipation and diarrhea.  Genitourinary: Negative for dysuria.  Musculoskeletal: Negative for back pain, joint pain, myalgias and neck pain.       +Muscle tenderness Denies muscle  weakness   Skin: Negative for rash.  Neurological: Negative for dizziness and headaches.  Psychiatric/Behavioral: Negative for depression. The patient is not nervous/anxious and does not have insomnia.     Observations/Objective: Physical Exam  Constitutional: He is oriented to person, place, and time and well-developed, well-nourished, and in no distress.  HENT:  Head: Normocephalic and atraumatic.  Eyes: Conjunctivae are normal.  Pulmonary/Chest: Effort normal.  Neurological: He is alert and oriented to person, place, and time.  Psychiatric: Mood, memory, affect and judgment normal.   Patient reports morning stiffness for 0 minutes.   Patient denies nocturnal pain.  Difficulty dressing/grooming: Denies Difficulty climbing stairs: Denies Difficulty getting out of chair: Denies Difficulty using hands for taps, buttons, cutlery, and/or writing: Denies   Assessment and Plan: Visit Diagnoses: Polymyalgia rheumatica (HCC)-He has been experiencing muscle tenderness in the right upper extremity in the bicep muscle.  He has no muscle weakness.  He has no difficulty raising his arms above his head or rising from a chair.  He has no LE muscle tenderness or muscle weakness at this time.  He has not had any signs or symptoms of a PMR flare.  He is currently taking prednisone 6 mg po daily.  He will continue tapering by 1 mg every month.  He does not want start MTX at this time.  Future order for sed rate placed today. He was advised to notify us if he develops signs or symptoms of a flare.  He will follow up in 3 months.  On prednisone therapy-He is taking prednisone 6 mg po daily.  He is tapering by 1 mg every month. He declined MTX in the past and does not want to proceed with MTX at this time.  Elevated sed rate-H/o-Secondary to PMR. Sed rate was 2 on 03/23/19.  He is not experiencing signs or symptoms of a PMR flare.   Vitamin D deficiency-He is taking vitamin D 2,000 units daily.    Osteopenia of multiple sites: DEXA 07/18/19 T-score -1.6 Left FN  BMD 0.714.  He has been taking vitamin D 2,000 units by mouth daily.  We discussed obtaining calcium 1200 mg daily.  We reviewed DEXA results and treatment options.  He is on long-term prednisone.  He has not had any recent injuries or falls.  We discussed starting him on Fosamax 70 mg 1 tablet by mouth once weekly.  Indications, contraindications, and potential side effects of fosamax were discussed.  He will require lab work prior to starting on Fosamax. Orders will be placed today.   Other medical problems are listed as follows:  Elevated cholesterol  Essential hypertension, benign  Atherosclerosis of native coronary artery of native heart without angina pectoris  Ischemic cardiomyopathy  Abdominal aortic atherosclerosis (HCC)  Prostate cancer (HCC)  History of gastroesophageal reflux (GERD)  History of diverticulosis  Hx of colonic polyps  Anxiety and depression   Follow Up Instructions: He will follow up in 3 months    I discussed the assessment and treatment plan with the patient. The patient was provided an opportunity to ask questions and all were answered. The patient agreed with the plan and demonstrated an understanding of the instructions.   The patient was advised to call back or seek an in-person evaluation if the symptoms worsen or if the condition fails to improve as anticipated.  I provided 25 minutes of non-face-to-face time during this encounter.   Bo Merino, MD   Scribed by-  Hazel Sams PA-C

## 2019-09-20 ENCOUNTER — Telehealth: Payer: PPO | Admitting: Rheumatology

## 2019-09-26 ENCOUNTER — Other Ambulatory Visit: Payer: Self-pay | Admitting: *Deleted

## 2019-09-26 ENCOUNTER — Telehealth: Payer: Self-pay | Admitting: Rheumatology

## 2019-09-26 DIAGNOSIS — M8589 Other specified disorders of bone density and structure, multiple sites: Secondary | ICD-10-CM | POA: Diagnosis not present

## 2019-09-26 DIAGNOSIS — M353 Polymyalgia rheumatica: Secondary | ICD-10-CM | POA: Diagnosis not present

## 2019-09-26 DIAGNOSIS — E559 Vitamin D deficiency, unspecified: Secondary | ICD-10-CM

## 2019-09-26 DIAGNOSIS — Z5181 Encounter for therapeutic drug level monitoring: Secondary | ICD-10-CM

## 2019-09-26 DIAGNOSIS — Z7952 Long term (current) use of systemic steroids: Secondary | ICD-10-CM

## 2019-09-26 DIAGNOSIS — R700 Elevated erythrocyte sedimentation rate: Secondary | ICD-10-CM

## 2019-09-26 NOTE — Telephone Encounter (Signed)
Patient called requesting his labwork orders be faxed to [facility] in [place].  Patient is at their office now.  Fax [phone]

## 2019-09-26 NOTE — Telephone Encounter (Signed)
Lab orders released and faxed.  

## 2019-09-27 LAB — CBC WITH DIFFERENTIAL/PLATELET
Absolute Monocytes: 562 cells/uL (ref 200–950)
Basophils Absolute: 22 cells/uL (ref 0–200)
Basophils Relative: 0.3 %
Eosinophils Absolute: 52 cells/uL (ref 15–500)
Eosinophils Relative: 0.7 %
HCT: 47.1 % (ref 38.5–50.0)
Hemoglobin: 15.9 g/dL (ref 13.2–17.1)
Lymphs Abs: 1317 cells/uL (ref 850–3900)
MCH: 31.9 pg (ref 27.0–33.0)
MCHC: 33.8 g/dL (ref 32.0–36.0)
MCV: 94.4 fL (ref 80.0–100.0)
MPV: 10.8 fL (ref 7.5–12.5)
Monocytes Relative: 7.6 %
Neutro Abs: 5446 cells/uL (ref 1500–7800)
Neutrophils Relative %: 73.6 %
Platelets: 189 10*3/uL (ref 140–400)
RBC: 4.99 10*6/uL (ref 4.20–5.80)
RDW: 12.6 % (ref 11.0–15.0)
Total Lymphocyte: 17.8 %
WBC: 7.4 10*3/uL (ref 3.8–10.8)

## 2019-09-27 LAB — COMPLETE METABOLIC PANEL WITH GFR
AG Ratio: 1.9 (calc) (ref 1.0–2.5)
ALT: 23 U/L (ref 9–46)
AST: 21 U/L (ref 10–35)
Albumin: 4.4 g/dL (ref 3.6–5.1)
Alkaline phosphatase (APISO): 52 U/L (ref 35–144)
BUN/Creatinine Ratio: 12 (calc) (ref 6–22)
BUN: 17 mg/dL (ref 7–25)
CO2: 25 mmol/L (ref 20–32)
Calcium: 9.8 mg/dL (ref 8.6–10.3)
Chloride: 106 mmol/L (ref 98–110)
Creat: 1.4 mg/dL — ABNORMAL HIGH (ref 0.70–1.25)
GFR, Est African American: 59 mL/min/{1.73_m2} — ABNORMAL LOW (ref >=60)
GFR, Est Non African American: 51 mL/min/{1.73_m2} — ABNORMAL LOW (ref >=60)
Globulin: 2.3 g/dL (calc) (ref 1.9–3.7)
Glucose, Bld: 92 mg/dL (ref 65–139)
Potassium: 4.6 mmol/L (ref 3.5–5.3)
Sodium: 141 mmol/L (ref 135–146)
Total Bilirubin: 0.6 mg/dL (ref 0.2–1.2)
Total Protein: 6.7 g/dL (ref 6.1–8.1)

## 2019-09-27 LAB — VITAMIN D 25 HYDROXY (VIT D DEFICIENCY, FRACTURES): Vit D, 25-Hydroxy: 33 ng/mL (ref 30–100)

## 2019-09-27 LAB — SEDIMENTATION RATE: Sed Rate: 2 mm/h (ref 0–20)

## 2019-09-27 NOTE — Progress Notes (Signed)
Vitamin D is WNL.  Please recommend a maintenance dose of vitamin D.  Sed rate WNL. CBC WNL.  Creatinine elevated-1.40 and GFR low-51.  Please advise patient to avoid NSAIDs.   Dr. Estanislado Pandy is ok with him starting on Fosamax 70 mg po once weekly.  Please advise patient to return in 6 weeks to recheck CMP

## 2019-09-29 MED ORDER — ALENDRONATE SODIUM 70 MG PO TABS: 70.0000 mg | ORAL_TABLET | ORAL | 0 refills | Status: DC

## 2019-09-29 NOTE — Telephone Encounter (Signed)
Prescription sent to the pharmacy.

## 2019-11-09 ENCOUNTER — Other Ambulatory Visit: Payer: Self-pay | Admitting: Rheumatology

## 2019-11-09 NOTE — Telephone Encounter (Signed)
Last Visit:09/19/19 Next Visit: due December 2020. Message sent to the front to schedule.  Labs: 09/26/19 Creatinine elevated-1.40 and GFR low-51.  Patient states he is currently on 4 mg of Prednisone.   Okay to refill per Dr. Estanislado Pandy

## 2019-11-09 NOTE — Telephone Encounter (Signed)
Please schedule patient for a follow up appointment. Patient due December 2020 Thanks!

## 2019-12-12 ENCOUNTER — Other Ambulatory Visit: Payer: Self-pay | Admitting: Rheumatology

## 2019-12-12 NOTE — Telephone Encounter (Signed)
Last Visit: 09/19/2019 telemedicine  Next Visit: 01/03/2020  Tapering prednisone by 1mg  monthly.   Okay to refill per Dr. Estanislado Pandy.

## 2020-01-02 NOTE — Progress Notes (Signed)
Virtual Visit via Video Note  I connected with [name] on 01/03/20 at  1:30 PM EST by a video enabled telemedicine application and verified that I am speaking with the correct person using two identifiers.  Location: Patient: Home Provider: Clinic  This service was conducted via virtual visit.  Both audio and visual tools were used.  The patient was located at home. I was located in my office.  Consent was obtained prior to the virtual visit and is aware of possible charges through their insurance for this visit.  The patient is an established patient.  Dr. Estanislado Pandy, MD conducted the virtual visit and Hazel Sams, PA-C acted as scribe during the service.  Office staff helped with scheduling follow up visits after the service was conducted.   I discussed the limitations of evaluation and management by telemedicine and the availability of in person appointments. The patient expressed understanding and agreed to proceed.  CC: Medication monitoring  History of Present Illness: Patient is a 69 year old male with a past medical history of PMR. He is currently taking prednisone 2 mg daily.  He is tapering by 1 mg every month.  He has not had any recent flares.  He reduced prednisone from 3 mg daily to 2 mg daily 4 days ago.  He has occasional mild muscle aches in his upper extremities.  He has no difficulty raising his arms or getting up from a chair.  He denies any joint pain or joint swelling currently.  He has been tolerating fosamax 70 mg 1 tablet once weekly.    Review of Systems  Constitutional: Negative for fever and malaise/fatigue.  HENT: Negative for ear pain and tinnitus.   Eyes: Negative for photophobia, pain, discharge and redness.  Respiratory: Negative for cough, shortness of breath and wheezing.   Cardiovascular: Negative for chest pain and palpitations.  Gastrointestinal: Negative for blood in stool, constipation and diarrhea.  Genitourinary: Negative for dysuria and  urgency.  Musculoskeletal: Positive for joint pain. Negative for back pain, myalgias and neck pain.  Skin: Negative for rash.  Neurological: Negative for dizziness, weakness and headaches.  Endo/Heme/Allergies: Does not bruise/bleed easily.  Psychiatric/Behavioral: Negative for depression and memory loss. The patient is not nervous/anxious and does not have insomnia.      Observations/Objective: Physical Exam  Constitutional: He is oriented to person, place, and time and well-developed, well-nourished, and in no distress.  HENT:  Head: Normocephalic and atraumatic.  Eyes: Conjunctivae are normal.  Pulmonary/Chest: Effort normal.  Neurological: He is alert and oriented to person, place, and time.  Psychiatric: Mood, memory, affect and judgment normal.   Patient reports morning stiffness for 0 minutes.   Patient denies nocturnal pain.  Difficulty dressing/grooming: Denies Difficulty climbing stairs: Denies Difficulty getting out of chair: Denies Difficulty using hands for taps, buttons, cutlery, and/or writing: Denies   Assessment and Plan: Visit Diagnoses:Polymyalgia rheumatica ([place])- He tapered prednisone from 3 mg to 2 mg 4 days ago.  He has not had any new or worsening symptoms since reducing the dose of prednisone. He has occasional mild muscle aches in his upper extremities, which seems to be fleeting.  He has no difficulty raising his arms or rising from a seated position.  He denies any joint pain or joint swelling at this time. He has not had any nocturnal pain or worsening fatigue.  He declined MTX in the past.   He will continue to take prednisone 2 mg daily for 2 months then reduce  to 1 mg for 2 months. He was advised to notify us if he develops any signs or symptoms of a flare.  He will follow up in 4 months.   On prednisone therapy-Prednisone 2 mg daily x2 months then reduce to 1 mg daily x2 months.  He declined MTX in the past. He was advised to update CBC and  CMP  Elevated sed rate-H/o-Secondary to PMR. Sed rate was 2 on 09/26/2019.    Vitamin D deficiency-He is taking vitamin D 2,000 units daily.   Osteopenia of multiple sites: DEXA 07/18/19 T-score -1.6 Left FN  BMD 0.714.  He has been on long term prednisone.  He has not had any recent falls or fractures.  He has been taking vitamin D 2,000 units by mouth daily and was started on Fosamax 70 mg 1 tablet by mouth once weekly in September 2020.  He has been tolerating fosamax without any side effects. A refill of fosamax was sent to the pharmacy.   Other medical problems are listed as follows:  Elevated cholesterol  Essential hypertension, benign  Atherosclerosis of native coronary artery of native heart without angina pectoris  Ischemic cardiomyopathy  Abdominal aortic atherosclerosis (HCC)  Prostate cancer (HCC)  History of gastroesophageal reflux (GERD)  History of diverticulosis  Hx of colonic polyps  Anxiety and depression  Follow Up Instructions: He will follow up in 4 months.   I discussed the assessment and treatment plan with the patient. The patient was provided an opportunity to ask questions and all were answered. The patient agreed with the plan and demonstrated an understanding of the instructions.   The patient was advised to call back or seek an in-person evaluation if the symptoms worsen or if the condition fails to improve as anticipated.  I provided 15 minutes of non-face-to-face time during this encounter.  Bo Merino, MD   Scribed by-  Hazel Sams, PA-C

## 2020-01-03 ENCOUNTER — Telehealth (INDEPENDENT_AMBULATORY_CARE_PROVIDER_SITE_OTHER): Payer: Medicare Other | Admitting: Rheumatology

## 2020-01-03 ENCOUNTER — Other Ambulatory Visit: Payer: Self-pay

## 2020-01-03 ENCOUNTER — Encounter: Payer: Self-pay | Admitting: Rheumatology

## 2020-01-03 DIAGNOSIS — C61 Malignant neoplasm of prostate: Secondary | ICD-10-CM

## 2020-01-03 DIAGNOSIS — I10 Essential (primary) hypertension: Secondary | ICD-10-CM

## 2020-01-03 DIAGNOSIS — M353 Polymyalgia rheumatica: Secondary | ICD-10-CM

## 2020-01-03 DIAGNOSIS — I255 Ischemic cardiomyopathy: Secondary | ICD-10-CM

## 2020-01-03 DIAGNOSIS — E7800 Pure hypercholesterolemia, unspecified: Secondary | ICD-10-CM

## 2020-01-03 DIAGNOSIS — M8589 Other specified disorders of bone density and structure, multiple sites: Secondary | ICD-10-CM

## 2020-01-03 DIAGNOSIS — Z7952 Long term (current) use of systemic steroids: Secondary | ICD-10-CM | POA: Diagnosis not present

## 2020-01-03 DIAGNOSIS — E559 Vitamin D deficiency, unspecified: Secondary | ICD-10-CM

## 2020-01-03 DIAGNOSIS — R700 Elevated erythrocyte sedimentation rate: Secondary | ICD-10-CM | POA: Diagnosis not present

## 2020-01-03 DIAGNOSIS — I2510 Atherosclerotic heart disease of native coronary artery without angina pectoris: Secondary | ICD-10-CM

## 2020-01-03 DIAGNOSIS — F419 Anxiety disorder, unspecified: Secondary | ICD-10-CM

## 2020-01-03 DIAGNOSIS — I700 Atherosclerosis of aorta: Secondary | ICD-10-CM

## 2020-01-03 DIAGNOSIS — F329 Major depressive disorder, single episode, unspecified: Secondary | ICD-10-CM

## 2020-01-03 DIAGNOSIS — Z8719 Personal history of other diseases of the digestive system: Secondary | ICD-10-CM

## 2020-01-03 DIAGNOSIS — Z86010 Personal history of colonic polyps: Secondary | ICD-10-CM

## 2020-01-03 MED ORDER — ALENDRONATE SODIUM 70 MG PO TABS: ORAL_TABLET | ORAL | 0 refills | Status: DC

## 2020-01-05 ENCOUNTER — Other Ambulatory Visit: Payer: Self-pay | Admitting: Rheumatology

## 2020-01-05 NOTE — Telephone Encounter (Signed)
I LMOM for patient to schedule a follow up appointment for around 05/02/2020.

## 2020-01-05 NOTE — Telephone Encounter (Signed)
Last Visit: 01/03/20 Next Visit due May 2021. Message sent to the front to schedule patient   Okay to refill per Dr. Estanislado Pandy

## 2020-01-05 NOTE — Telephone Encounter (Signed)
Please schedule patient for a follow up visit. Patient due May 2021. Thanks!

## 2020-01-23 ENCOUNTER — Ambulatory Visit: Payer: PPO | Admitting: Family Medicine

## 2020-01-28 ENCOUNTER — Other Ambulatory Visit: Payer: Self-pay | Admitting: Rheumatology

## 2020-01-30 NOTE — Telephone Encounter (Addendum)
Last Visit: 01/03/20 Next Visit: 05/04/20  Attempted to contact the patient and left message for patient to call the office to verify dose of Prednisone.   Patient returned call to the office and states he is on Prednisone 2 mg daily and will taper to 1 mg next month.  Okay to refill per Dr. Estanislado Pandy

## 2020-01-31 ENCOUNTER — Encounter: Payer: Self-pay | Admitting: Rheumatology

## 2020-01-31 NOTE — Telephone Encounter (Signed)
Reviewed lab work.  CMP was obtained which was the most pertinent test at this time. CBC was updated in September 2020.  He will require CBC and CMP every 6 months. He does not need any additional labs right now.

## 2020-02-22 ENCOUNTER — Other Ambulatory Visit: Payer: Self-pay | Admitting: Family Medicine

## 2020-02-22 DIAGNOSIS — F411 Generalized anxiety disorder: Secondary | ICD-10-CM

## 2020-03-15 ENCOUNTER — Other Ambulatory Visit: Payer: Self-pay | Admitting: Rheumatology

## 2020-03-15 NOTE — Telephone Encounter (Addendum)
Last Visit: 01/03/20 Next Visit: 05/04/20  Left message to verify dose of Prednisone.   Patient returned call and states he is taking Prednisone 1 mg.   Okay to refill per Dr. Estanislado Pandy

## 2020-04-07 ENCOUNTER — Other Ambulatory Visit: Payer: Self-pay | Admitting: Rheumatology

## 2020-04-09 NOTE — Telephone Encounter (Signed)
Spoke with patient and he states he will be tapered by the end of the month. Has enough medication to last until then .

## 2020-04-27 NOTE — Progress Notes (Signed)
Virtual Visit via Video Note  I connected with [name] on 05/04/20 at 12:00 PM EDT by a video enabled telemedicine application and verified that I am speaking with the correct person using two identifiers.  Location: Patient: Home  Provider: Clinic  This service was conducted via virtual visit.  Both audio and visual tools were used.  The patient was located at home. I was located in my office.  Consent was obtained prior to the virtual visit and is aware of possible charges through their insurance for this visit.  The patient is an established patient.  Dr. Estanislado Pandy, MD conducted the virtual visit and Hazel Sams, PA-C acted as scribe during the service.  Office staff helped with scheduling follow up visits after the service was conducted.   CC: Medication monitoring  History of Present Illness: Patient is a 69 year old male with a past medical history of PMR and osteopenia.  He has not had any signs or symptoms of a flare.  He discontinued prednisone on 04/27/2020 and has not had any recurrence of symptoms.  He denies any joint pain or joint swelling at this time.  He denies any muscle weakness or muscle tenderness at this time.  He continues to take a calcium and vitamin D supplement.  He is no longer taking Fosamax.  Review of Systems  Constitutional: Negative for fever and malaise/fatigue.  HENT: Negative for congestion.   Eyes: Negative for photophobia, pain, discharge and redness.  Respiratory: Negative for cough, shortness of breath and wheezing.   Cardiovascular: Negative for chest pain, palpitations and leg swelling.  Gastrointestinal: Negative for blood in stool, constipation and diarrhea.  Genitourinary: Negative for dysuria.  Musculoskeletal: Negative for back pain, joint pain, myalgias and neck pain.  Skin: Negative for rash.  Neurological: Negative for dizziness, weakness and headaches.  Endo/Heme/Allergies: Does not bruise/bleed easily.  Psychiatric/Behavioral:  Negative for depression and memory loss. The patient is not nervous/anxious and does not have insomnia.       Observations/Objective: Physical Exam  Constitutional: He is oriented to person, place, and time and well-developed, well-nourished, and in no distress.  HENT:  Head: Normocephalic and atraumatic.  Eyes: Conjunctivae are normal.  Pulmonary/Chest: Effort normal.  Neurological: He is alert and oriented to person, place, and time.  Psychiatric: Mood, memory, affect and judgment normal.   Patient reports morning stiffness for 0 none.   Patient denies nocturnal pain.  Difficulty dressing/grooming: Denies Difficulty climbing stairs: Denies Difficulty getting out of chair: Denies Difficulty using hands for taps, buttons, cutlery, and/or writing: Denies   Assessment and Plan: Visit Diagnoses:Polymyalgia rheumatica ([place])- He has not had any signs or symptoms of a PMR flare.  He is not experiencing any myalgias, muscle tenderness, or muscle weakness.  He has no difficulty rising from a seated position or raising his arms above his head. He discontinued prednisone on 04/27/20.  He has not noticed any new or worsening symptoms since discontinuing prednisone.  He was advised to notify us if he develops symptoms of flare.  He will follow up in 1 year.   On prednisone therapy-He discontinued prednisone on 04/27/20.    Elevated sed rate-H/o-Secondary to PMR. Sed rate was 2 on 09/26/2019.   Vitamin D deficiency-He is taking vitamin D 2,000 units daily.   Osteopenia of multiple sites: DEXA 07/18/19 T-score -1.6 Left FN BMD 0.714.  Repeat DEXA in July 2022.  He has been on long term prednisone.  He has not had any recent falls  or fractures. He has been taking a calcium and vitamin D 2,000 units by mouth daily.  He is not currently taking fosamax. Resistive exercises were discussed.   Other medical problems are listed as follows:  Elevated cholesterol  Essential hypertension,  benign  Atherosclerosis of native coronary artery of native heart without angina pectoris  Ischemic cardiomyopathy  Abdominal aortic atherosclerosis (HCC)  Prostate cancer (HCC)  History of gastroesophageal reflux (GERD)  History of diverticulosis  Hx of colonic polyps  Anxiety and depression  Follow Up Instructions: He will follow up in 1 year.     I discussed the assessment and treatment plan with the patient. The patient was provided an opportunity to ask questions and all were answered. The patient agreed with the plan and demonstrated an understanding of the instructions.   The patient was advised to call back or seek an in-person evaluation if the symptoms worsen or if the condition fails to improve as anticipated.  I provided  20 minutes of non-face-to-face time during this encounter.   Bo Merino, MD   Scribed by-  Hazel Sams, PA-C

## 2020-05-04 ENCOUNTER — Telehealth (INDEPENDENT_AMBULATORY_CARE_PROVIDER_SITE_OTHER): Payer: Medicare Other | Admitting: Rheumatology

## 2020-05-04 ENCOUNTER — Telehealth: Payer: Self-pay | Admitting: Rheumatology

## 2020-05-04 ENCOUNTER — Encounter: Payer: Self-pay | Admitting: Rheumatology

## 2020-05-04 VITALS — Ht 71.0 in

## 2020-05-04 DIAGNOSIS — M353 Polymyalgia rheumatica: Secondary | ICD-10-CM | POA: Diagnosis not present

## 2020-05-04 DIAGNOSIS — C61 Malignant neoplasm of prostate: Secondary | ICD-10-CM

## 2020-05-04 DIAGNOSIS — Z86010 Personal history of colonic polyps: Secondary | ICD-10-CM

## 2020-05-04 DIAGNOSIS — I10 Essential (primary) hypertension: Secondary | ICD-10-CM

## 2020-05-04 DIAGNOSIS — R700 Elevated erythrocyte sedimentation rate: Secondary | ICD-10-CM | POA: Diagnosis not present

## 2020-05-04 DIAGNOSIS — Z8719 Personal history of other diseases of the digestive system: Secondary | ICD-10-CM

## 2020-05-04 DIAGNOSIS — Z7952 Long term (current) use of systemic steroids: Secondary | ICD-10-CM

## 2020-05-04 DIAGNOSIS — I2510 Atherosclerotic heart disease of native coronary artery without angina pectoris: Secondary | ICD-10-CM

## 2020-05-04 DIAGNOSIS — M8589 Other specified disorders of bone density and structure, multiple sites: Secondary | ICD-10-CM

## 2020-05-04 DIAGNOSIS — E7800 Pure hypercholesterolemia, unspecified: Secondary | ICD-10-CM

## 2020-05-04 DIAGNOSIS — F329 Major depressive disorder, single episode, unspecified: Secondary | ICD-10-CM

## 2020-05-04 DIAGNOSIS — I255 Ischemic cardiomyopathy: Secondary | ICD-10-CM

## 2020-05-04 DIAGNOSIS — E559 Vitamin D deficiency, unspecified: Secondary | ICD-10-CM

## 2020-05-04 DIAGNOSIS — F419 Anxiety disorder, unspecified: Secondary | ICD-10-CM

## 2020-05-04 NOTE — Telephone Encounter (Signed)
I left a message on patient's voicemail requesting a call back to schedule a follow up appointment for 1 year (around 05/04/2021) with Dr. Estanislado Pandy.

## 2020-05-28 ENCOUNTER — Other Ambulatory Visit: Payer: Self-pay | Admitting: Cardiology

## 2020-06-15 ENCOUNTER — Other Ambulatory Visit: Payer: Self-pay | Admitting: Cardiology

## 2020-06-22 ENCOUNTER — Other Ambulatory Visit: Payer: Self-pay | Admitting: Cardiology

## 2020-10-05 ENCOUNTER — Other Ambulatory Visit: Payer: Self-pay | Admitting: Rheumatology

## 2020-10-05 MED ORDER — PREDNISONE 10 MG PO TABS: 10.0000 mg | ORAL_TABLET | Freq: Every day | ORAL | 0 refills | Status: DC

## 2020-10-05 NOTE — Telephone Encounter (Signed)
Patient started experiencing right hand / fingers swelling. Hand difficult to close. This starting late August. Patient went to PCP, and was given a Prednisone rx. Symptoms cleared, but approximately 4 days after he finished medication symptoms returned. Patient would like to make you aware of this, and see if he can schedule a virtual appointment for treatment since he does not live here in town. Please call to discuss, and advise.

## 2020-10-05 NOTE — Telephone Encounter (Signed)
I spoke with the patient.  He states he has been having swelling in his right hand.  He denies any muscle weakness or muscle tenderness.  He states he was given prednisone by his PCP which was helpful but now he is having discomfort.  He lives in [place].  He is willing to come for an appointment in the office.  Please schedule a follow-up appointment.  I also discussed placing him on prednisone 10 mg p.o. daily until his follow-up appointment.  Side effects were discussed.  Please send a prescription for prednisone 10 mg p.o. daily total 30 tablets with no refills.

## 2020-10-08 NOTE — Progress Notes (Signed)
Office Visit Note  Patient: [name]             Date of Birth: 05-Oct-1951           MRN: [id number]             PCP: Pcp, No Referring: No ref. provider found Visit Date: 10/15/2020 Occupation: @GUAROCC @  Subjective:  Other (right hand pain, stiffness and swelling. )   History of Present Illness: [name] is a 69 y.o. male with history of polymyalgia rheumatica.  He has been off prednisone since April 2021.  He states mid August he started having pain and swelling in his right second MCP joint.  At the time he was seen by his PCP and was placed on prednisone 20 mg p.o. daily for 4 days with every 5 mg taper every 4 days.  He states once he finished the taper the swelling came back.  At that time he contacted me and we placed him on prednisone 10 mg p.o. daily.  He states the pain and swelling was only in his hands and did not move to any other joints.  He denies any history of muscle weakness or muscle pain.  Activities of Daily Living:  Patient reports morning stiffness for all day.  Patient Denies nocturnal pain.  Difficulty dressing/grooming: Denies Difficulty climbing stairs: Denies Difficulty getting out of chair: Denies Difficulty using hands for taps, buttons, cutlery, and/or writing: Reports  Review of Systems  Constitutional: Negative for fatigue.  HENT: Negative for mouth sores, mouth dryness and nose dryness.   Eyes: Negative for pain, itching and dryness.  Respiratory: Negative for shortness of breath and difficulty breathing.   Cardiovascular: Negative for chest pain and palpitations.  Gastrointestinal: Negative for blood in stool, constipation and diarrhea.  Endocrine: Positive for increased urination.  Genitourinary: Negative for difficulty urinating and painful urination.  Musculoskeletal: Positive for arthralgias, joint pain, joint swelling and morning stiffness. Negative for myalgias, muscle tenderness and myalgias.  Skin: Negative for color  change, rash and redness.  Allergic/Immunologic: Negative for susceptible to infections.  Neurological: Negative for dizziness, numbness, headaches, memory loss and weakness.  Hematological: Negative for bruising/bleeding tendency.  Psychiatric/Behavioral: Negative for confusion and sleep disturbance.    PMFS History:  Patient Active Problem List   Diagnosis Date Noted  . Abdominal aortic atherosclerosis ([place]) 12/03/2016  . 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 Vitamin D deficiency 11/27/2015  . Atherosclerosis of native coronary artery of native heart without angina pectoris 09/21/2014  . Essential hypertension, benign 09/21/2014  . Chest pain 05/25/2014  . Ischemic cardiomyopathy   . Hypertension   . Abnormal nuclear stress test 05/18/2014  . 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 Pure hypercholesterolemia 11/03/2011  . CAD (coronary artery disease) 11/03/2011  . Elevated cholesterol 02/17/2011  . GERD (gastroesophageal reflux disease) 02/17/2011  . Prostate cancer ([place]) 09/29/2007    Past Medical History:  Diagnosis Date  . Allergy   . Anxiety   . Arthritis    hips  . CAD (coronary artery disease) 2010, 2015 stents   a. s/p DES to LAD 06/2009. b. LHC (05/23/14): LAD proximal to previously placed stent  30%, mid LAD distal to the stent 99%, CFX and RCA No CAD EF 50% with ant HK. PCI:  18 x 2.5 mm diameter Xience Alpine DES to mid LAD.  Marland Kitchen Colon polyps 10/2015   tubular adeomas x 4  . Depression   . Diverticulosis 10/2015   noted on  colonoscopy  . ED (erectile dysfunction)   . GERD (gastroesophageal reflux disease)   . Hyperlipidemia   . Hypertension   . Inguinal hernia, left 08/2014   seen on CT (containing peritoneal fat)  . Internal hemorrhoids 10/2015   noted on colonoscopy  . Irregular heartbeat    started on beta blocker by Dr. Marlou Porch, improved  . Kidney stone 03/2010   08/2014-distal R ureter  . Myocardial infarction ([place])    2010  . Prostate cancer  ([place]) 01/2008    s/p brachiotherapy; Dr. Karsten Ro  . Shingles 2010    Family History  Problem Relation Age of Onset  . Heart disease Father 73  . Prostate cancer Father 14  . Hypertension Father   . Hyperlipidemia Father   . Dementia Father   . Eating disorder Mother   . Bladder Cancer Mother        and peritoneal cancer  . Hodgkin's lymphoma Daughter   . Cardiomyopathy Daughter        related to treatment for lymphoma  . Thyroid cancer Daughter   . Heart disease Daughter        congenital ASD, s/p repair  . Cancer Daughter        Hodkin's; thyroid CA; lymphoma; mesothelioma 10/2015  . Mesothelioma Daughter   . Heart disease Brother        arrhythmia  . Prostate cancer Paternal Grandfather   . Diabetes Neg Hx   . Colon cancer Neg Hx   . Colon polyps Neg Hx   . Esophageal cancer Neg Hx   . Rectal cancer Neg Hx   . Stomach cancer Neg Hx    Past Surgical History:  Procedure Laterality Date  . CARDIAC CATHETERIZATION  05/23/2014  . COLONOSCOPY  2005, 10/2015  . CORONARY ANGIOPLASTY WITH STENT PLACEMENT  06/2009   "1"  . INGUINAL HERNIA REPAIR Left 10/1998  . LEFT HEART CATHETERIZATION WITH CORONARY ANGIOGRAM N/A 05/23/2014   Procedure: LEFT HEART CATHETERIZATION WITH CORONARY ANGIOGRAM;  Surgeon: Candee Furbish, MD;  Location[facility] CATH LAB;  Service: Cardiovascular;  Laterality: N/A;  . PERCUTANEOUS CORONARY STENT INTERVENTION (PCI-S) N/A 05/24/2014   Procedure: PERCUTANEOUS CORONARY STENT INTERVENTION (PCI-S);  Surgeon: Sinclair Grooms, MD;  Location: [facility] CATH LAB;  Service: Cardiovascular;  Laterality: N/A;  . POLYPECTOMY    . RADIOACTIVE SEED IMPLANT  01/2008   prostate cancer  . VASECTOMY     Social History   Social History Narrative   Married. Lives with wife. Daughter passed away May 29, 2018 (mesothelioma).   Other daughter lives in [place] with 3 add'l grandchildren.   5 grandchildren (locally) and one in [place].   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    Immunization History  Administered Date(s) Administered  . Influenza Split 11/03/2011, 10/29/2012  . Influenza, High Dose Seasonal PF 09/03/2016, 10/28/2018  . Influenza,inj,Quad PF,6+ Mos 10/26/2013, 08/30/2014  . Influenza-Unspecified 10/16/2015, 10/17/2017  . Moderna SARS-COVID-2 Vaccination 03/09/2020  . PFIZER SARS-COV-2 Vaccination 02/10/2020  . Pneumococcal Conjugate-13 11/29/2015  . Pneumococcal Polysaccharide-23 12/03/2016  . Td 11/26/2005  . Tdap 11/03/2011  . Zoster 02/28/2014     Objective: Vital Signs: BP (!) 151/88 (BP Location: Left Arm, Patient Position: Sitting, Cuff Size: Normal)   Pulse 60   Resp 17   Ht 5\' 10"  (1.778 m)   Wt 215 lb 3.2 oz (97.6 kg)   BMI 30.88 kg/m    Physical Exam Vitals and nursing note reviewed.  Constitutional:      Appearance: He  is well-developed.  HENT:     Head: Normocephalic and atraumatic.  Eyes:     Conjunctiva/sclera: Conjunctivae normal.     Pupils: Pupils are equal, round, and reactive to light.  Cardiovascular:     Rate and Rhythm: Normal rate and regular rhythm.     Heart sounds: Normal heart sounds.  Pulmonary:     Effort: Pulmonary effort is normal.     Breath sounds: Normal breath sounds.  Abdominal:     General: Bowel sounds are normal.     Palpations: Abdomen is soft.  Musculoskeletal:     Cervical back: Normal range of motion and neck supple.  Skin:    General: Skin is warm and dry.     Capillary Refill: Capillary refill takes less than 2 seconds.  Neurological:     Mental Status: He is alert and oriented to person, place, and time.  Psychiatric:        Behavior: Behavior normal.      Musculoskeletal Exam: C-spine was in good range of motion.  Shoulder joints, elbow joints, wrist joints, MCPs PIPs and DIPs with good range of motion.  He had mild tenderness on palpation of her right second MCP with no synovitis.  Hip joints, knee joints, ankles, MTPs and PIPs with good range of motion with no  synovitis. CDAI Exam: CDAI Score: 1.2  Patient Global: 1 mm; Provider Global: 1 mm Swollen: 0 ; Tender: 1  Joint Exam 10/15/2020      Right  Left  MCP 2   Tender        Investigation: No additional findings.  Imaging: No results found.  Recent Labs: Lab Results  Component Value Date   WBC 7.4 09/26/2019   HGB 15.9 09/26/2019   PLT 189 09/26/2019   NA 141 09/26/2019   K 4.6 09/26/2019   CL 106 09/26/2019   CO2 25 09/26/2019   GLUCOSE 92 09/26/2019   BUN 17 09/26/2019   CREATININE 1.40 (H) 09/26/2019   BILITOT 0.6 09/26/2019   ALKPHOS 75 01/19/2019   AST 21 09/26/2019   ALT 23 09/26/2019   PROT 6.7 09/26/2019   ALBUMIN 4.0 01/19/2019   CALCIUM 9.8 09/26/2019   GFRAA 59 (L) 09/26/2019   QFTBGOLDPLUS NEGATIVE 03/23/2019    Speciality Comments: No specialty comments available.  Procedures:  No procedures performed Allergies: Imdur [isosorbide mononitrate]   Assessment / Plan:     Visit Diagnoses: Chronic inflammatory arthritis -Forrest has been experiencing pain and swelling in his right hand especially his right second MCP joint for the last 3 months off and on.  He had good response to prednisone each time and the swelling recurred after he came off prednisone.  He did not have any episodes of increased muscle weakness or tenderness.  He was placed on prednisone 10 mg p.o. daily about 10 days ago and had good response to it.  I detailed discussion regarding possible chronic inflammatory arthritis most likely rheumatoid arthritis.  He had x-ray of bilateral hands locally.  He will bring x-rays at the next visit.  As he is unable to taper prednisone we discussed possible use of DMARD.  Indications side effects contraindications of different medications were discussed.  Operative indications have sed rate deficiency was willing to start on Plaquenil.  Handout was given and consent was taken.  The plan is to start him on Plaquenil 200 mg p.o. twice daily total 60 tablets  with 2 refills were given.  Have also given him a  prednisone taper starting at 10 mg and taper by 2.5 mg every week.  Plan: Sedimentation rate, Rheumatoid factor, Cyclic citrul peptide antibody, IgG  Patient was counseled on the purpose, proper use, and adverse effects of hydroxychloroquine including nausea/diarrhea, skin rash, headaches, and sun sensitivity.  Discussed importance of annual eye exams while on hydroxychloroquine to monitor to ocular toxicity and discussed importance of frequent laboratory monitoring.  Provided patient with eye exam form for baseline ophthalmologic exam.  Provided patient with educational materials on hydroxychloroquine and answered all questions.  Patient consented to hydroxychloroquine.  Will upload consent in the media tab.    Patient states that he will get eye examination locally and will forward results to [name].    Polymyalgia rheumatica (HCC)-in remission.  He has been off prednisone since April 2021.  He had no recurrence of PMR symptoms.  On prednisone therapy - Discontinued Prednisone 04/27/20. restarted 10/05/20  High risk medication use - Plan: CBC with Differential/Platelet, COMPLETE METABOLIC PANEL WITH GFR, Glucose 6 phosphate dehydrogenase  Elevated sed rate  Osteopenia of multiple sites - DEXA 07/18/19 T-score -1.6 Left FN  BMD 0.714.  Repeat DEXA in July 2022.  He has been on long term prednisone. He did not start fosamax  Vitamin D deficiency-has been advised to take vitamin D supplement.  Essential hypertension, benign-his blood pressure is elevated.  Which could be due to the prednisone use.  Have advised him to monitor his blood pressure closely.  Other medical problems are listed as follows:  Elevated cholesterol  Atherosclerosis of native coronary artery of native heart without angina pectoris  Ischemic cardiomyopathy  Prostate cancer (HCC)  Hx of colonic polyps  History of gastroesophageal reflux (GERD)  Anxiety and  depression  History of diverticulosis  Abdominal aortic atherosclerosis ([place])  Educated about COVID-19 virus infection-he has been fully vaccinated against COVID-19.  Have advised him to get a booster once available to him.  Instructions were placed in the AVS per ACR guidelines.  Use of monoclonal antibodies were also discussed in case he develops COVID-19 infection.      Orders: Orders Placed This Encounter  Procedures  . CBC with Differential/Platelet  . COMPLETE METABOLIC PANEL WITH GFR  . Sedimentation rate  . Rheumatoid factor  . Cyclic citrul peptide antibody, IgG  . Glucose 6 phosphate dehydrogenase   Meds ordered this encounter  Medications  . hydroxychloroquine (PLAQUENIL) 200 MG tablet    Sig: Take 1 tablet (200 mg total) by mouth 2 (two) times daily.    Dispense:  60 tablet    Refill:  2  . predniSONE (DELTASONE) 5 MG tablet    Sig: Take 2 tabs po qd x 7 days, take 1.5 tabs po qd x 7 days, take 1 tab po qd x 7 days, take 1/2 tab po qd x 7 days.    Dispense:  35 tablet    Refill:  0    Follow-Up Instructions: Return in about 3 months (around 01/15/2021) for Rheumatoid arthritis.   Bo Merino, MD  Note - This record has been created using Editor, commissioning.  Chart creation errors have been sought, but may not always  have been located. Such creation errors do not reflect on  the standard of medical care.

## 2020-10-08 NOTE — Telephone Encounter (Signed)
I LMOM for patient to call, and reschedule his in office return appointment.

## 2020-10-15 ENCOUNTER — Other Ambulatory Visit: Payer: Self-pay

## 2020-10-15 ENCOUNTER — Encounter: Payer: Self-pay | Admitting: Rheumatology

## 2020-10-15 ENCOUNTER — Ambulatory Visit (INDEPENDENT_AMBULATORY_CARE_PROVIDER_SITE_OTHER): Payer: Medicare Other | Admitting: Rheumatology

## 2020-10-15 VITALS — BP 151/88 | HR 60 | Resp 17 | Ht 70.0 in | Wt 215.2 lb

## 2020-10-15 DIAGNOSIS — Z5181 Encounter for therapeutic drug level monitoring: Secondary | ICD-10-CM

## 2020-10-15 DIAGNOSIS — Z860100 Personal history of colon polyps, unspecified: Secondary | ICD-10-CM

## 2020-10-15 DIAGNOSIS — I700 Atherosclerosis of aorta: Secondary | ICD-10-CM

## 2020-10-15 DIAGNOSIS — M8589 Other specified disorders of bone density and structure, multiple sites: Secondary | ICD-10-CM

## 2020-10-15 DIAGNOSIS — Z7189 Other specified counseling: Secondary | ICD-10-CM

## 2020-10-15 DIAGNOSIS — R700 Elevated erythrocyte sedimentation rate: Secondary | ICD-10-CM

## 2020-10-15 DIAGNOSIS — M353 Polymyalgia rheumatica: Secondary | ICD-10-CM | POA: Diagnosis not present

## 2020-10-15 DIAGNOSIS — Z8719 Personal history of other diseases of the digestive system: Secondary | ICD-10-CM

## 2020-10-15 DIAGNOSIS — I10 Essential (primary) hypertension: Secondary | ICD-10-CM

## 2020-10-15 DIAGNOSIS — F32A Depression, unspecified: Secondary | ICD-10-CM

## 2020-10-15 DIAGNOSIS — I255 Ischemic cardiomyopathy: Secondary | ICD-10-CM

## 2020-10-15 DIAGNOSIS — F419 Anxiety disorder, unspecified: Secondary | ICD-10-CM

## 2020-10-15 DIAGNOSIS — Z79899 Other long term (current) drug therapy: Secondary | ICD-10-CM | POA: Diagnosis not present

## 2020-10-15 DIAGNOSIS — Z7952 Long term (current) use of systemic steroids: Secondary | ICD-10-CM | POA: Diagnosis not present

## 2020-10-15 DIAGNOSIS — E559 Vitamin D deficiency, unspecified: Secondary | ICD-10-CM

## 2020-10-15 DIAGNOSIS — E7800 Pure hypercholesterolemia, unspecified: Secondary | ICD-10-CM

## 2020-10-15 DIAGNOSIS — M1990 Unspecified osteoarthritis, unspecified site: Secondary | ICD-10-CM

## 2020-10-15 DIAGNOSIS — I2510 Atherosclerotic heart disease of native coronary artery without angina pectoris: Secondary | ICD-10-CM

## 2020-10-15 DIAGNOSIS — C61 Malignant neoplasm of prostate: Secondary | ICD-10-CM

## 2020-10-15 MED ORDER — HYDROXYCHLOROQUINE SULFATE 200 MG PO TABS
200.0000 mg | ORAL_TABLET | Freq: Two times a day (BID) | ORAL | 2 refills | Status: DC
Start: 2020-10-15 — End: 2021-01-15

## 2020-10-15 MED ORDER — PREDNISONE 5 MG PO TABS: ORAL_TABLET | ORAL | 0 refills | Status: DC

## 2020-10-15 NOTE — Patient Instructions (Signed)
Hydroxychloroquine tablets What is this medicine? HYDROXYCHLOROQUINE (hye drox ee KLOR oh kwin) is used to treat rheumatoid arthritis and systemic lupus erythematosus. It is also used to treat malaria. This medicine may be used for other purposes; ask your health care provider or pharmacist if you have questions. COMMON BRAND NAME(S): Plaquenil, Quineprox What should I tell my health care provider before I take this medicine? They need to know if you have any of these conditions:  diabetes  eye disease, vision problems  G6PD deficiency  heart disease  history of irregular heartbeat  if you often drink alcohol  kidney disease  liver disease  porphyria  psoriasis  an unusual or allergic reaction to chloroquine, hydroxychloroquine, other medicines, foods, dyes, or preservatives  pregnant or trying to get pregnant  breast-feeding How should I use this medicine? Take this medicine by mouth with a glass of water. Follow the directions on the prescription label. Do not cut, crush or chew this medicine. Swallow the tablets whole. Take this medicine with food. Avoid taking antacids within 4 hours of taking this medicine. It is best to separate these medicines by at least 4 hours. Take your medicine at regular intervals. Do not take it more often than directed. Take all of your medicine as directed even if you think you are better. Do not skip doses or stop your medicine early. Talk to your pediatrician regarding the use of this medicine in children. While this drug may be prescribed for selected conditions, precautions do apply. Overdosage: If you think you have taken too much of this medicine contact a poison control center or emergency room at once. NOTE: This medicine is only for you. Do not share this medicine with others. What if I miss a dose? If you miss a dose, take it as soon as you can. If it is almost time for your next dose, take only that dose. Do not take double or extra  doses. What may interact with this medicine? Do not take this medicine with any of the following medications:  cisapride  dronedarone  pimozide  thioridazine This medicine may also interact with the following medications:  ampicillin  antacids  cimetidine  cyclosporine  digoxin  kaolin  medicines for diabetes, like insulin, glipizide, glyburide  medicines for seizures like carbamazepine, phenobarbital, phenytoin  mefloquine  methotrexate  other medicines that prolong the QT interval (cause an abnormal heart rhythm)  praziquantel This list may not describe all possible interactions. Give your health care provider a list of all the medicines, herbs, non-prescription drugs, or dietary supplements you use. Also tell them if you smoke, drink alcohol, or use illegal drugs. Some items may interact with your medicine. What should I watch for while using this medicine? Visit your health care professional for regular checks on your progress. Tell your health care professional if your symptoms do not start to get better or if they get worse. You may need blood work done while you are taking this medicine. If you take other medicines that can affect heart rhythm, you may need more testing. Talk to your health care professional if you have questions. Your vision may be tested before and during use of this medicine. Tell your health care professional right away if you have any change in your eyesight. What side effects may I notice from receiving this medicine? Side effects that you should report to your doctor or health care professional as soon as possible:  allergic reactions like skin rash, itching or hives,   swelling of the face, lips, or tongue  changes in vision  decreased hearing or ringing of the ears  muscle weakness  redness, blistering, peeling or loosening of the skin, including inside the mouth  sensitivity to light  signs and symptoms of a dangerous change in  heartbeat or heart rhythm like chest pain; dizziness; fast or irregular heartbeat; palpitations; feeling faint or lightheaded, falls; breathing problems  signs and symptoms of liver injury like dark yellow or brown urine; general ill feeling or flu-like symptoms; light-colored stools; loss of appetite; nausea; right upper belly pain; unusually weak or tired; yellowing of the eyes or skin  signs and symptoms of low blood sugar such as feeling anxious; confusion; dizziness; increased hunger; unusually weak or tired; sweating; shakiness; cold; irritable; headache; blurred vision; fast heartbeat; loss of consciousness  suicidal thoughts  uncontrollable head, mouth, neck, arm, or leg movements Side effects that usually do not require medical attention (report to your doctor or health care professional if they continue or are bothersome):  diarrhea  dizziness  hair loss  headache  irritable  loss of appetite  nausea, vomiting  stomach pain This list may not describe all possible side effects. Call your doctor for medical advice about side effects. You may report side effects to FDA at 1-800-FDA-1088. Where should I keep my medicine? Keep out of the reach of children. Store at room temperature between 15 and 30 degrees C (59 and 86 degrees F). Protect from moisture and light. Throw away any unused medicine after the expiration date. NOTE: This sheet is a summary. It may not cover all possible information. If you have questions about this medicine, talk to your doctor, pharmacist, or health care provider.  2020 Elsevier/Gold Standard (2019-04-25 12:56:32)  Standing Labs We placed an order today for your standing lab work.   Please have your standing labs drawn in 1 month after starting Plaquenil and then every 3 months  If possible, please have your labs drawn 2 weeks prior to your appointment so that the provider can discuss your results at your appointment.  We have open lab  daily Monday through Thursday from 8:30-12:30 PM and 1:30-4:30 PM and Friday from 8:30-12:30 PM and 1:30-4:00 PM at the office of Dr. Bo Merino, [facility].   Please be advised, patients with office appointments requiring lab work will take precedents over walk-in lab work.  If possible, please come for your lab work on Monday and Friday afternoons, as you may experience shorter wait times. The office is located at [address] [place], [place], [place] [address] No appointment is necessary.   Labs are drawn by [facility]. Please bring your co-pay at the time of your lab draw.  You may receive a bill from [facility] for your lab work.  If you wish to have your labs drawn at another location, please call the office 24 hours in advance to send orders.  If you have any questions regarding directions or hours of operation,  please call [phone].   As a reminder, please drink plenty of water prior to coming for your lab work. Thanks!  COVID-19 vaccine recommendations:   COVID-19 vaccine is recommended for everyone (unless you are allergic to a vaccine component), even if you are on a medication that suppresses your immune system.   If you are on Methotrexate, Cellcept (mycophenolate), Rinvoq, Morrie Sheldon, and Olumiant- hold the medication for 1 week after each vaccine. Hold Methotrexate for 2 weeks after the single dose COVID-19 vaccine.  Do not take Tylenol or any anti-inflammatory medications (NSAIDs) 24 hours prior to the COVID-19 vaccination.   There is no direct evidence about the efficacy of the COVID-19 vaccine in individuals who are on medications that suppress the immune system.   Even if you are fully vaccinated, and you are on any medications that suppress your immune system, please continue to wear a mask, maintain at least six feet social distance and practice hand hygiene.   If you develop a COVID-19 infection, please contact your PCP or our office to determine  if you need antibody infusion.  The booster vaccine is now available for immunocompromised patients. It is advised that if you had Pfizer vaccine you should get [employer] booster.  If you had a Moderna vaccine then you should get a Moderna booster. Johnson and Wynetta Emery does not have a booster vaccine at this time.  Please see the following web sites for updated information.   [email or web address]

## 2020-10-16 LAB — COMPLETE METABOLIC PANEL WITH GFR
AG Ratio: 1.9 (calc) (ref 1.0–2.5)
ALT: 30 U/L (ref 9–46)
AST: 24 U/L (ref 10–35)
Albumin: 4.3 g/dL (ref 3.6–5.1)
Alkaline phosphatase (APISO): 60 U/L (ref 35–144)
BUN/Creatinine Ratio: 11 (calc) (ref 6–22)
BUN: 14 mg/dL (ref 7–25)
CO2: 27 mmol/L (ref 20–32)
Calcium: 9.5 mg/dL (ref 8.6–10.3)
Chloride: 104 mmol/L (ref 98–110)
Creat: 1.31 mg/dL — ABNORMAL HIGH (ref 0.70–1.18)
GFR, Est African American: 63 mL/min/{1.73_m2} (ref >=60)
GFR, Est Non African American: 55 mL/min/{1.73_m2} — ABNORMAL LOW (ref >=60)
Globulin: 2.3 g/dL (calc) (ref 1.9–3.7)
Glucose, Bld: 110 mg/dL — ABNORMAL HIGH (ref 65–99)
Potassium: 4.5 mmol/L (ref 3.5–5.3)
Sodium: 137 mmol/L (ref 135–146)
Total Bilirubin: 0.7 mg/dL (ref 0.2–1.2)
Total Protein: 6.6 g/dL (ref 6.1–8.1)

## 2020-10-16 LAB — CBC WITH DIFFERENTIAL/PLATELET
Absolute Monocytes: 449 {cells}/uL (ref 200–950)
Basophils Absolute: 20 {cells}/uL (ref 0–200)
Basophils Relative: 0.3 %
Eosinophils Absolute: 20 {cells}/uL (ref 15–500)
Eosinophils Relative: 0.3 %
HCT: 46.8 % (ref 38.5–50.0)
Hemoglobin: 15.7 g/dL (ref 13.2–17.1)
Lymphs Abs: 964 {cells}/uL (ref 850–3900)
MCH: 31.4 pg (ref 27.0–33.0)
MCHC: 33.5 g/dL (ref 32.0–36.0)
MCV: 93.6 fL (ref 80.0–100.0)
MPV: 10.7 fL (ref 7.5–12.5)
Monocytes Relative: 6.8 %
Neutro Abs: 5148 {cells}/uL (ref 1500–7800)
Neutrophils Relative %: 78 %
Platelets: 242 10*3/uL (ref 140–400)
RBC: 5 Million/uL (ref 4.20–5.80)
RDW: 13.2 % (ref 11.0–15.0)
Total Lymphocyte: 14.6 %
WBC: 6.6 10*3/uL (ref 3.8–10.8)

## 2020-10-16 LAB — GLUCOSE 6 PHOSPHATE DEHYDROGENASE: G-6PDH: 12.9 U/g Hgb (ref 7.0–20.5)

## 2020-10-16 LAB — RHEUMATOID FACTOR: Rheumatoid fact SerPl-aCnc: <14 IU/mL (ref <14)

## 2020-10-16 LAB — CYCLIC CITRUL PEPTIDE ANTIBODY, IGG: Cyclic Citrullin Peptide Ab: <16 UNITS

## 2020-10-16 LAB — SEDIMENTATION RATE: Sed Rate: 2 mm/h (ref 0–20)

## 2020-10-16 NOTE — Progress Notes (Signed)
All the labs are within normal limits except for low GFR.  He should discuss low GFR with his PCP.

## 2020-10-21 ENCOUNTER — Encounter: Payer: Self-pay | Admitting: Rheumatology

## 2021-01-08 NOTE — Progress Notes (Signed)
Office Visit Note  Patient: [name]             Date of Birth: 06-Jul-1951           MRN: [id number]             PCP: Pcp, No Referring: No ref. provider found Visit Date: 01/22/2021 Occupation: @GUAROCC @  Subjective:  Other (Occasional pain in right arm. )   History of Present Illness: TAVIO BIEGEL is a 70 y.o. male with seronegative rheumatoid arthritis and polymyalgia rheumatica.  He was placed on Plaquenil at the last visit.  He tapered off prednisone.  He states the Plaquenil is holding his symptoms well.  He denies any joint pain or joint swelling.  He states he has occasional pain in his right arm.  He denies any muscular weakness.  He had his eye examination which was normal.  Activities of Daily Living:  Patient reports morning stiffness for 0 minutes.   Patient Reports nocturnal pain.  Difficulty dressing/grooming: Denies Difficulty climbing stairs: Denies Difficulty getting out of chair: Denies Difficulty using hands for taps, buttons, cutlery, and/or writing: Denies  Review of Systems  Constitutional: Negative for fatigue.  HENT: Negative for mouth sores, mouth dryness and nose dryness.   Eyes: Negative for pain, itching and dryness.  Respiratory: Negative for shortness of breath and difficulty breathing.   Cardiovascular: Negative for chest pain and palpitations.  Gastrointestinal: Negative for blood in stool, constipation and diarrhea.  Endocrine: Negative for increased urination.  Genitourinary: Negative for difficulty urinating.  Musculoskeletal: Negative for arthralgias, joint pain, joint swelling, myalgias, morning stiffness, muscle tenderness and myalgias.  Skin: Negative for color change, rash and redness.  Allergic/Immunologic: Negative for susceptible to infections.  Neurological: Negative for dizziness, numbness, headaches, memory loss and weakness.  Hematological: Negative for bruising/bleeding tendency.  Psychiatric/Behavioral: Negative  for confusion.    PMFS History:  Patient Active Problem List   Diagnosis Date Noted  . Abdominal aortic atherosclerosis ([place]) 12/03/2016  . 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 Vitamin D deficiency 11/27/2015  . Atherosclerosis of native coronary artery of native heart without angina pectoris 09/21/2014  . Essential hypertension, benign 09/21/2014  . Chest pain 05/25/2014  . Ischemic cardiomyopathy   . Hypertension   . Abnormal nuclear stress test 05/18/2014  . 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 Pure hypercholesterolemia 11/03/2011  . CAD (coronary artery disease) 11/03/2011  . Elevated cholesterol 02/17/2011  . GERD (gastroesophageal reflux disease) 02/17/2011  . Prostate cancer ([place]) 09/29/2007    Past Medical History:  Diagnosis Date  . Allergy   . Anxiety   . Arthritis    hips  . CAD (coronary artery disease) 2010, 2015 stents   a. s/p DES to LAD 06/2009. b. LHC (05/23/14): LAD proximal to previously placed stent  30%, mid LAD distal to the stent 99%, CFX and RCA No CAD EF 50% with ant HK. PCI:  18 x 2.5 mm diameter Xience Alpine DES to mid LAD.  Marland Kitchen Colon polyps 10/2015   tubular adeomas x 4  . Depression   . Diverticulosis 10/2015   noted on colonoscopy  . ED (erectile dysfunction)   . GERD (gastroesophageal reflux disease)   . Hyperlipidemia   . Hypertension   . Inguinal hernia, left 08/2014   seen on CT (containing peritoneal fat)  . Internal hemorrhoids 10/2015   noted on colonoscopy  . Irregular heartbeat    started on beta blocker by Dr. Marlou Porch, improved  .  Kidney stone 03/2010   08/2014-distal R ureter  . Myocardial infarction (HCC)    2010  . Prostate cancer (HCC) 01/2008    s/p brachiotherapy; Dr. Vernie Ammons  . Shingles 2010    Family History  Problem Relation Age of Onset  . Heart disease Father 73  . Prostate cancer Father 30  . Hypertension Father   . Hyperlipidemia Father   . Dementia Father   . Eating disorder Mother   . Bladder  Cancer Mother        and peritoneal cancer  . Hodgkin's lymphoma Daughter   . Cardiomyopathy Daughter        related to treatment for lymphoma  . Thyroid cancer Daughter   . Heart disease Daughter        congenital ASD, s/p repair  . Cancer Daughter        Hodkin's; thyroid CA; lymphoma; mesothelioma 10/2015  . Mesothelioma Daughter   . Heart disease Brother        arrhythmia  . Prostate cancer Paternal Grandfather   . Diabetes Neg Hx   . Colon cancer Neg Hx   . Colon polyps Neg Hx   . Esophageal cancer Neg Hx   . Rectal cancer Neg Hx   . Stomach cancer Neg Hx    Past Surgical History:  Procedure Laterality Date  . CARDIAC CATHETERIZATION  05/23/2014  . COLONOSCOPY  2005, 10/2015  . CORONARY ANGIOPLASTY WITH STENT PLACEMENT  06/2009   "1"  . INGUINAL HERNIA REPAIR Left 10/1998  . LEFT HEART CATHETERIZATION WITH CORONARY ANGIOGRAM N/A 05/23/2014   Procedure: LEFT HEART CATHETERIZATION WITH CORONARY ANGIOGRAM;  Surgeon: Donato Schultz, MD;  Location[facility] CATH LAB;  Service: Cardiovascular;  Laterality: N/A;  . PERCUTANEOUS CORONARY STENT INTERVENTION (PCI-S) N/A 05/24/2014   Procedure: PERCUTANEOUS CORONARY STENT INTERVENTION (PCI-S);  Surgeon: Lesleigh Noe, MD;  Location: [facility] CATH LAB;  Service: Cardiovascular;  Laterality: N/A;  . POLYPECTOMY    . RADIOACTIVE SEED IMPLANT  01/2008   prostate cancer  . VASECTOMY     Social History   Social History Narrative   Married. Lives with wife. Daughter passed away Jun 11, 2018 (mesothelioma).   Other daughter lives in [place] with 3 add'l grandchildren.   5 grandchildren (locally) and one in [place].   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   Immunization History  Administered Date(s) Administered  . Fluad Quad(high Dose 65+) 10/17/2020  . Influenza Split 11/03/2011, 10/29/2012  . Influenza, High Dose Seasonal PF 09/03/2016, 10/28/2018, 10/12/2019  . Influenza,inj,Quad PF,6+ Mos 10/26/2013, 08/30/2014  . Influenza-Unspecified  10/16/2015, 10/17/2017  . Moderna Sars-Covid-2 Vaccination 02/10/2020, 03/09/2020  . PFIZER(Purple Top)SARS-COV-2 Vaccination 11/02/2020  . Pneumococcal Conjugate-13 11/29/2015, 12/30/2015  . Pneumococcal Polysaccharide-23 12/03/2016, 12/29/2016  . Td 11/26/2005  . Tdap 11/03/2011  . Zoster 02/28/2014     Objective: Vital Signs: BP 130/69 (BP Location: Left Arm, Patient Position: Sitting, Cuff Size: Normal)   Pulse 62   Resp 16   Ht 5\' 11"  (1.803 m)   Wt 212 lb (96.2 kg)   BMI 29.57 kg/m    Physical Exam Vitals and nursing note reviewed.  Constitutional:      Appearance: He is well-developed and well-nourished.  HENT:     Head: Normocephalic and atraumatic.  Eyes:     Extraocular Movements: EOM normal.     Conjunctiva/sclera: Conjunctivae normal.     Pupils: Pupils are equal, round, and reactive to light.  Cardiovascular:     Rate and Rhythm: Normal rate  and regular rhythm.     Heart sounds: Normal heart sounds.  Pulmonary:     Effort: Pulmonary effort is normal.     Breath sounds: Normal breath sounds.  Abdominal:     General: Bowel sounds are normal.     Palpations: Abdomen is soft.  Musculoskeletal:     Cervical back: Normal range of motion and neck supple.  Skin:    General: Skin is warm and dry.     Capillary Refill: Capillary refill takes less than 2 seconds.  Neurological:     Mental Status: He is alert and oriented to person, place, and time.  Psychiatric:        Mood and Affect: Mood and affect normal.        Behavior: Behavior normal.      Musculoskeletal Exam: C-spine was in good range of motion.  Shoulder joints, elbow joints, wrist joints with good range of motion.  He had no synovitis over MCPs or PIPs.  PIP and DIP thickening was noted.  Hip joints and knee joints with good range of motion.  He had no tenderness over ankles or MTPs.  CDAI Exam: CDAI Score: 0.2  Patient Global: 1 mm; Provider Global: 1 mm Swollen: 0 ; Tender: 0  Joint Exam  01/22/2021   No joint exam has been documented for this visit   There is currently no information documented on the homunculus. Go to the Rheumatology activity and complete the homunculus joint exam.  Investigation: No additional findings.  Imaging: No results found.  Recent Labs: Lab Results  Component Value Date   WBC 6.6 10/15/2020   HGB 15.7 10/15/2020   PLT 242 10/15/2020   NA 137 10/15/2020   K 4.5 10/15/2020   CL 104 10/15/2020   CO2 27 10/15/2020   GLUCOSE 110 (H) 10/15/2020   BUN 14 10/15/2020   CREATININE 1.31 (H) 10/15/2020   BILITOT 0.7 10/15/2020   ALKPHOS 75 01/19/2019   AST 24 10/15/2020   ALT 30 10/15/2020   PROT 6.6 10/15/2020   ALBUMIN 4.0 01/19/2019   CALCIUM 9.5 10/15/2020   GFRAA 63 10/15/2020   QFTBGOLDPLUS NEGATIVE 03/23/2019    Speciality Comments: PLQ eye exam 01/07/2021 normal 1 year f/u [place].  Procedures:  No procedures performed Allergies: Imdur [isosorbide mononitrate]   Assessment / Plan:     Visit Diagnoses: Seronegative rheumatoid arthritis (HCC)-he is clinically doing well with no synovitis on my examination.  He has responded well to hydroxychloroquine.  He has been off prednisone.  Polymyalgia rheumatica (HCC)-he had no muscular weakness or tenderness on my examination.  High risk medication use - Plaquenil 200mg  by mouth twice daily.  - Plan: CBC with Differential/Platelet, COMPLETE METABOLIC PANEL WITH GFR today and then at the follow-up visit.  On prednisone therapy - Discontinued Prednisone 04/27/20 as PMR was in remission. restarted 10/05/20 for for inflammatory arthritis  Osteopenia of multiple sites - DEXA 07/18/19 T-score -1.6 Left FN  BMD 0.714.  Repeat DEXA in July 2022.  He has been on long term prednisone.  He did not take Fosamax.  Patient comes from [place] and he would like to alternate visits between virtual and office in the future.  Vitamin D deficiency-he is on supplement.  Other medical problems  are listed as follows:  Elevated cholesterol  Essential hypertension, benign  Ischemic cardiomyopathy  Atherosclerosis of native coronary artery of native heart without angina pectoris  Hx of colonic polyps  Prostate cancer ([place])  History  of diverticulosis  History of gastroesophageal reflux (GERD)  Anxiety and depression  Abdominal aortic atherosclerosis ([place])  Orders: Orders Placed This Encounter  Procedures  . CBC with Differential/Platelet  . COMPLETE METABOLIC PANEL WITH GFR   No orders of the defined types were placed in this encounter.    Follow-Up Instructions: Return in about 4 months (around 05/22/2021) for Rheumatoid arthritis, Polymyalgia rheumatica.   Bo Merino, MD  Note - This record has been created using Editor, commissioning.  Chart creation errors have been sought, but may not always  have been located. Such creation errors do not reflect on  the standard of medical care.

## 2021-01-15 ENCOUNTER — Other Ambulatory Visit: Payer: Self-pay | Admitting: Rheumatology

## 2021-01-15 MED ORDER — HYDROXYCHLOROQUINE SULFATE 200 MG PO TABS: 200.0000 mg | ORAL_TABLET | Freq: Two times a day (BID) | ORAL | 2 refills | Status: DC

## 2021-01-15 NOTE — Telephone Encounter (Signed)
Is the prescription ok to be sent to [facility] in [place], [place]?

## 2021-01-15 NOTE — Addendum Note (Signed)
Addended by: Carole Binning on: 01/15/2021 02:27 PM   Modules accepted: Orders

## 2021-01-15 NOTE — Telephone Encounter (Signed)
Attempted to contact the patient and left message to verify pharmacy. Prescription refill request came from [place], [place]. Advised patient to call the office and advise where he would like the prescription filled.

## 2021-01-15 NOTE — Telephone Encounter (Signed)
Patient returned call to the office stating he would like [facility]. Patient states he had his PLQ eye exam done on 01/03/2021 and will call the eye doctor to have the results sent.

## 2021-01-15 NOTE — Telephone Encounter (Signed)
Last Visit: 10/15/2020 Next Visit: 01/22/2021 Labs: 10/15/2020 All the labs are within normal limits except for low GFR Eye exam: no baseline on file    Current Dose per office note 10/15/2020: Plaquenil 200 mg p.o. twice daily  DX: Chronic inflammatory arthritis   Attempted to contact the patient and left message to advise patient he is due for an eye exam.   Okay to refill Plaquenil?

## 2021-01-22 ENCOUNTER — Encounter: Payer: Self-pay | Admitting: Rheumatology

## 2021-01-22 ENCOUNTER — Other Ambulatory Visit: Payer: Self-pay

## 2021-01-22 ENCOUNTER — Ambulatory Visit (INDEPENDENT_AMBULATORY_CARE_PROVIDER_SITE_OTHER): Payer: Medicare Other | Admitting: Rheumatology

## 2021-01-22 VITALS — BP 130/69 | HR 62 | Resp 16 | Ht 71.0 in | Wt 212.0 lb

## 2021-01-22 DIAGNOSIS — Z7952 Long term (current) use of systemic steroids: Secondary | ICD-10-CM

## 2021-01-22 DIAGNOSIS — Z79899 Other long term (current) drug therapy: Secondary | ICD-10-CM | POA: Diagnosis not present

## 2021-01-22 DIAGNOSIS — Z8719 Personal history of other diseases of the digestive system: Secondary | ICD-10-CM

## 2021-01-22 DIAGNOSIS — F32A Depression, unspecified: Secondary | ICD-10-CM

## 2021-01-22 DIAGNOSIS — I2510 Atherosclerotic heart disease of native coronary artery without angina pectoris: Secondary | ICD-10-CM

## 2021-01-22 DIAGNOSIS — I10 Essential (primary) hypertension: Secondary | ICD-10-CM

## 2021-01-22 DIAGNOSIS — I700 Atherosclerosis of aorta: Secondary | ICD-10-CM

## 2021-01-22 DIAGNOSIS — I255 Ischemic cardiomyopathy: Secondary | ICD-10-CM | POA: Diagnosis not present

## 2021-01-22 DIAGNOSIS — C61 Malignant neoplasm of prostate: Secondary | ICD-10-CM

## 2021-01-22 DIAGNOSIS — M8589 Other specified disorders of bone density and structure, multiple sites: Secondary | ICD-10-CM

## 2021-01-22 DIAGNOSIS — M0600 Rheumatoid arthritis without rheumatoid factor, unspecified site: Secondary | ICD-10-CM

## 2021-01-22 DIAGNOSIS — F419 Anxiety disorder, unspecified: Secondary | ICD-10-CM

## 2021-01-22 DIAGNOSIS — M353 Polymyalgia rheumatica: Secondary | ICD-10-CM

## 2021-01-22 DIAGNOSIS — E7800 Pure hypercholesterolemia, unspecified: Secondary | ICD-10-CM

## 2021-01-22 DIAGNOSIS — E559 Vitamin D deficiency, unspecified: Secondary | ICD-10-CM

## 2021-01-22 DIAGNOSIS — R700 Elevated erythrocyte sedimentation rate: Secondary | ICD-10-CM

## 2021-01-22 DIAGNOSIS — Z86010 Personal history of colonic polyps: Secondary | ICD-10-CM

## 2021-01-22 DIAGNOSIS — M1990 Unspecified osteoarthritis, unspecified site: Secondary | ICD-10-CM

## 2021-01-23 LAB — COMPLETE METABOLIC PANEL WITH GFR
AG Ratio: 2.1 (calc) (ref 1.0–2.5)
ALT: 22 U/L (ref 9–46)
AST: 24 U/L (ref 10–35)
Albumin: 4.6 g/dL (ref 3.6–5.1)
Alkaline phosphatase (APISO): 50 U/L (ref 35–144)
BUN/Creatinine Ratio: 13 (calc) (ref 6–22)
BUN: 19 mg/dL (ref 7–25)
CO2: 25 mmol/L (ref 20–32)
Calcium: 9.6 mg/dL (ref 8.6–10.3)
Chloride: 105 mmol/L (ref 98–110)
Creat: 1.5 mg/dL — ABNORMAL HIGH (ref 0.70–1.18)
GFR, Est African American: 54 mL/min/{1.73_m2} — ABNORMAL LOW (ref >=60)
GFR, Est Non African American: 46 mL/min/{1.73_m2} — ABNORMAL LOW (ref >=60)
Globulin: 2.2 g/dL (calc) (ref 1.9–3.7)
Glucose, Bld: 86 mg/dL (ref 65–99)
Potassium: 4.5 mmol/L (ref 3.5–5.3)
Sodium: 141 mmol/L (ref 135–146)
Total Bilirubin: 0.8 mg/dL (ref 0.2–1.2)
Total Protein: 6.8 g/dL (ref 6.1–8.1)

## 2021-01-23 LAB — CBC WITH DIFFERENTIAL/PLATELET
Absolute Monocytes: 767 cells/uL (ref 200–950)
Basophils Absolute: 39 cells/uL (ref 0–200)
Basophils Relative: 0.6 %
Eosinophils Absolute: 137 cells/uL (ref 15–500)
Eosinophils Relative: 2.1 %
HCT: 46.9 % (ref 38.5–50.0)
Hemoglobin: 16.1 g/dL (ref 13.2–17.1)
Lymphs Abs: 1716 cells/uL (ref 850–3900)
MCH: 32.3 pg (ref 27.0–33.0)
MCHC: 34.3 g/dL (ref 32.0–36.0)
MCV: 94.2 fL (ref 80.0–100.0)
MPV: 10.9 fL (ref 7.5–12.5)
Monocytes Relative: 11.8 %
Neutro Abs: 3842 cells/uL (ref 1500–7800)
Neutrophils Relative %: 59.1 %
Platelets: 170 10*3/uL (ref 140–400)
RBC: 4.98 10*6/uL (ref 4.20–5.80)
RDW: 13 % (ref 11.0–15.0)
Total Lymphocyte: 26.4 %
WBC: 6.5 10*3/uL (ref 3.8–10.8)

## 2021-01-23 NOTE — Progress Notes (Signed)
Creatinine is mildly elevated most likely due to the use of ACE inhibitor.  Please forward labs to his PCP.

## 2021-04-15 ENCOUNTER — Other Ambulatory Visit: Payer: Self-pay | Admitting: Physician Assistant

## 2021-04-15 NOTE — Telephone Encounter (Signed)
Last Visit: 01/22/2021 Next Visit: 05/22/2021 Labs: 01/22/2021, Creatinine is mildly elevated most likely due to the use of ACE inhibitor. Please forward labs to his PCP. Eye exam: 01/07/2021  Current Dose per office note 01/22/2021,  Plaquenil 200mg  by mouth twice daily.  DX: Seronegative rheumatoid arthritis   Last Fill: 01/15/2021  Okay to refill Plaquenil?

## 2021-05-22 ENCOUNTER — Ambulatory Visit: Payer: Medicare Other | Admitting: Rheumatology

## 2021-07-13 ENCOUNTER — Other Ambulatory Visit: Payer: Self-pay | Admitting: Physician Assistant

## 2021-07-15 NOTE — Telephone Encounter (Signed)
Last Visit: 01/22/2021 Next Visit: message sent to front desk to schedule appt, Return in about 4 months (around 05/22/2021) for Rheumatoid arthrit  Labs: 01/22/2021, Creatinine is mildly elevated most likely due to the use of ACE inhibitor. Please forward labs to his PCP. LMOM labs are due next week. Eye exam: 01/07/2021   Current Dose per office note 01/22/2021: Plaquenil 200mg  by mouth twice daily. ZW:CHENIDPOEUMP rheumatoid arthritis  Last Fill: 04/15/2021  Okay to refill Plaquenil?

## 2021-07-15 NOTE — Telephone Encounter (Signed)
LMOM for patient to call office to schedule appt, Return in about 4 months (around 05/22/2021) for Rheumatoid arthritis

## 2021-10-14 ENCOUNTER — Other Ambulatory Visit: Payer: Self-pay | Admitting: Physician Assistant

## 2023-11-23 ENCOUNTER — Encounter: Payer: Self-pay | Admitting: Internal Medicine
